# Patient Record
Sex: Female | Born: 1990 | Race: Black or African American | Hispanic: No | Marital: Single | Smoking: Former smoker
Health system: Southern US, Community
[De-identification: ages and names within clinical notes are randomized; demographics above are authoritative.]

## PROBLEM LIST (undated history)

## (undated) ENCOUNTER — Inpatient Hospital Stay (HOSPITAL_COMMUNITY): Payer: Self-pay

## (undated) ENCOUNTER — Ambulatory Visit: Admission: EM | Payer: Medicaid Other

## (undated) DIAGNOSIS — R87629 Unspecified abnormal cytological findings in specimens from vagina: Secondary | ICD-10-CM

## (undated) DIAGNOSIS — A549 Gonococcal infection, unspecified: Secondary | ICD-10-CM

## (undated) DIAGNOSIS — B009 Herpesviral infection, unspecified: Secondary | ICD-10-CM

## (undated) DIAGNOSIS — A749 Chlamydial infection, unspecified: Secondary | ICD-10-CM

## (undated) HISTORY — DX: Unspecified abnormal cytological findings in specimens from vagina: R87.629

---
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
## 2011-01-08 LAB — US OB DETAIL + 14 WK

## 2011-01-17 ENCOUNTER — Ambulatory Visit (HOSPITAL_COMMUNITY)
Admission: RE | Admit: 2011-01-17 | Discharge: 2011-01-17 | Disposition: A | Discharge status: Home / Self Care | Payer: Self-pay | Source: Ambulatory Visit | Attending: Obstetrics and Gynecology | Admitting: Obstetrics and Gynecology

## 2011-01-17 ENCOUNTER — Other Ambulatory Visit (HOSPITAL_COMMUNITY): Payer: Self-pay | Admitting: Obstetrics and Gynecology

## 2011-01-17 DIAGNOSIS — Z3689 Encounter for other specified antenatal screening: Secondary | ICD-10-CM

## 2011-01-17 DIAGNOSIS — Q308 Other congenital malformations of nose: Secondary | ICD-10-CM

## 2011-01-17 DIAGNOSIS — O358XX0 Maternal care for other (suspected) fetal abnormality and damage, not applicable or unspecified: Secondary | ICD-10-CM | POA: Insufficient documentation

## 2011-01-22 NOTE — L&D Delivery Note (Signed)
Delivery Note Pregnancy complicated by known fetal anomaly nasal bifid, mild pylectasis, and placental cyst. At 4:09 AM a viable female was delivered via Vaginal, Spontaneous Delivery, VTX, delivered in bed,prior to repositioning pt for exam with RN unable to trace fhts, NICU immediately called as cord clamped and baby to resusitation team at warmer, with heart rate 90, gasp noted no tone, APGAR: 2, 4; weight 5 lb 14.5 oz (2679 g).  Neonatologist arrived at warmer and reviewed known fetal anomaly nasal bifid. Placenta status: Intact, Spontaneous.  Cord: 3 vessels with the following complications: known placental cyst seen on fetal side approximately 8 cm size fluid filled to pathology. Cord pH obtained but no results obtained with specimen.  Anesthesia: Epidural  Episiotomy: None Lacerations: None Suture Repair: NA Est. Blood Loss (mL): 200  Mom to postpartum.  Baby to NICU for evaluation.  [name] 05/02/2011, 5:13 AM

## 2011-02-14 ENCOUNTER — Ambulatory Visit (HOSPITAL_COMMUNITY)
Admission: RE | Admit: 2011-02-14 | Discharge: 2011-02-14 | Disposition: A | Discharge status: Home / Self Care | Payer: Medicaid Other | Source: Ambulatory Visit | Attending: Obstetrics and Gynecology | Admitting: Obstetrics and Gynecology

## 2011-02-14 DIAGNOSIS — Z3689 Encounter for other specified antenatal screening: Secondary | ICD-10-CM

## 2011-02-14 DIAGNOSIS — O358XX0 Maternal care for other (suspected) fetal abnormality and damage, not applicable or unspecified: Secondary | ICD-10-CM | POA: Insufficient documentation

## 2011-02-14 DIAGNOSIS — Q308 Other congenital malformations of nose: Secondary | ICD-10-CM

## 2011-02-14 NOTE — Progress Notes (Signed)
Encounter addended by: Marlana Latus, RN on: 02/14/2011  7:13 PM<BR>     Documentation filed: Episodes, Chief Complaint Section

## 2011-02-14 NOTE — Progress Notes (Signed)
Obstetric ultrasound performed today.  Please see report in [facility]. 

## 2011-03-07 ENCOUNTER — Ambulatory Visit (HOSPITAL_COMMUNITY): Payer: Self-pay

## 2011-03-14 ENCOUNTER — Ambulatory Visit (HOSPITAL_COMMUNITY)
Admission: RE | Admit: 2011-03-14 | Discharge: 2011-03-14 | Disposition: A | Discharge status: Home / Self Care | Payer: Medicaid Other | Source: Ambulatory Visit | Attending: Obstetrics and Gynecology | Admitting: Obstetrics and Gynecology

## 2011-03-14 ENCOUNTER — Other Ambulatory Visit: Payer: Self-pay

## 2011-03-14 DIAGNOSIS — Z3689 Encounter for other specified antenatal screening: Secondary | ICD-10-CM | POA: Insufficient documentation

## 2011-03-14 DIAGNOSIS — O358XX0 Maternal care for other (suspected) fetal abnormality and damage, not applicable or unspecified: Secondary | ICD-10-CM | POA: Insufficient documentation

## 2011-03-14 DIAGNOSIS — Q308 Other congenital malformations of nose: Secondary | ICD-10-CM

## 2011-03-15 ENCOUNTER — Encounter (HOSPITAL_COMMUNITY): Payer: Self-pay

## 2011-03-15 ENCOUNTER — Inpatient Hospital Stay (HOSPITAL_COMMUNITY)
Admission: AD | Admit: 2011-03-15 | Discharge: 2011-03-15 | Disposition: A | Discharge status: Home / Self Care | Payer: Medicaid Other | Source: Ambulatory Visit | Attending: Obstetrics and Gynecology | Admitting: Obstetrics and Gynecology

## 2011-03-15 DIAGNOSIS — O99891 Other specified diseases and conditions complicating pregnancy: Secondary | ICD-10-CM | POA: Insufficient documentation

## 2011-03-15 DIAGNOSIS — N898 Other specified noninflammatory disorders of vagina: Secondary | ICD-10-CM | POA: Insufficient documentation

## 2011-03-15 HISTORY — DX: Chlamydial infection, unspecified: A74.9

## 2011-03-15 HISTORY — DX: Gonococcal infection, unspecified: A54.9

## 2011-03-15 NOTE — Progress Notes (Signed)
Pt reports a lot of clear "snotty" discharge since last night, no foul odor. Denies vaginal bleeding. Reports positive fetal movement. Denies contractions.

## 2011-03-15 NOTE — Discharge Instructions (Signed)

## 2011-03-15 NOTE — Progress Notes (Signed)
History    21 yo g1p0 presents with c/o of thick discharge no vag irritation or bleeding, no UC, +FM. No chief complaint on file.  @SFHPI @  OB History    Grav Para Term Preterm Abortions TAB SAB Ect Mult Living   2 0 0 0 1 0 1 0 0 0       Past Medical History  Diagnosis Date  . Asthma   . Gonorrhea 2011  . Chlamydia 2011    Past Surgical History  Procedure Date  . Knee surgery 2007    Family History  Problem Relation Age of Onset  . Anesthesia problems Neg Hx   . Diabetes Maternal Grandmother   . Hypertension Maternal Grandmother     History  Substance Use Topics  . Smoking status: Former Games developer  . Smokeless tobacco: Not on file  . Alcohol Use: No    Allergies: No Known Allergies  Prescriptions prior to admission  Medication Sig Dispense Refill  . Ondansetron HCl (ZOFRAN PO) Take by mouth.      . Prenatal Vit-Fe Fumarate-FA (PRENATAL MULTIVITAMIN) TABS Take 1 tablet by mouth daily.      Marland Kitchen DISCONTD: PRENATAL VITAMINS PO Take by mouth.         @ROS @ Physical Exam   Blood pressure 124/72, pulse 90, temperature 97 F (36.1 C), temperature source Oral, resp. rate 16, last menstrual period 08/08/2010.  abd soft, gravid nt fhts reactive NST uc without SSE thick white to clear discharge no bleeding, no odor, vag LTC high A normal discharge P discharge home, f/o office as scheduled, s/s ptl to report. Lavera Guise, CNM

## 2011-03-19 ENCOUNTER — Encounter: Payer: Self-pay | Admitting: Obstetrics and Gynecology

## 2011-03-19 ENCOUNTER — Other Ambulatory Visit: Payer: Self-pay | Admitting: Obstetrics and Gynecology

## 2011-03-19 ENCOUNTER — Inpatient Hospital Stay (HOSPITAL_COMMUNITY)
Admission: AD | Admit: 2011-03-19 | Discharge: 2011-03-19 | Disposition: A | Discharge status: Home / Self Care | Payer: Medicaid Other | Source: Ambulatory Visit | Attending: Obstetrics and Gynecology | Admitting: Obstetrics and Gynecology

## 2011-03-19 ENCOUNTER — Encounter (HOSPITAL_COMMUNITY): Payer: Self-pay | Admitting: *Deleted

## 2011-03-19 DIAGNOSIS — N898 Other specified noninflammatory disorders of vagina: Secondary | ICD-10-CM

## 2011-03-19 DIAGNOSIS — R109 Unspecified abdominal pain: Secondary | ICD-10-CM | POA: Insufficient documentation

## 2011-03-19 DIAGNOSIS — Z331 Pregnant state, incidental: Secondary | ICD-10-CM

## 2011-03-19 DIAGNOSIS — O99891 Other specified diseases and conditions complicating pregnancy: Secondary | ICD-10-CM | POA: Insufficient documentation

## 2011-03-19 DIAGNOSIS — O212 Late vomiting of pregnancy: Secondary | ICD-10-CM | POA: Insufficient documentation

## 2011-03-19 DIAGNOSIS — O368190 Decreased fetal movements, unspecified trimester, not applicable or unspecified: Secondary | ICD-10-CM | POA: Insufficient documentation

## 2011-03-19 DIAGNOSIS — N949 Unspecified condition associated with female genital organs and menstrual cycle: Secondary | ICD-10-CM

## 2011-03-19 LAB — URINALYSIS, ROUTINE W REFLEX MICROSCOPIC
Bilirubin Urine: NEGATIVE
Nitrite: NEGATIVE
Specific Gravity, Urine: <1.005 — ABNORMAL LOW (ref 1.005–1.030)
pH: 6.5 (ref 5.0–8.0)

## 2011-03-19 LAB — WET PREP, GENITAL
Clue Cells Wet Prep HPF POC: NONE SEEN
Trich, Wet Prep: NONE SEEN

## 2011-03-19 LAB — URINE MICROSCOPIC-ADD ON

## 2011-03-19 MED ORDER — PROMETHAZINE HCL 12.5 MG PO TABS: 25.0000 mg | ORAL_TABLET | Freq: Four times a day (QID) | ORAL | Status: DC | PRN

## 2011-03-19 NOTE — Discharge Instructions (Signed)
Preventing Preterm Labor Preterm labor is when a pregnant woman has contractions that cause the cervix to open, shorten, and thin before 37 weeks of pregnancy. You will have regular contractions (tightening) 2 to 3 minutes apart. This usually causes discomfort or pain. HOME CARE  Eat a healthy diet.   Take your vitamins as told by your doctor.   Drink enough fluids to keep your pee (urine) clear or pale yellow every day.   Get rest and sleep.   Do not have sex if you are at high risk for preterm labor.   Follow your doctor's advice about activity, medicines, and tests.   Avoid stress.   Avoid hard labor or exercise that lasts for a long time.   Do not smoke.  GET HELP RIGHT AWAY IF:   You are having contractions.   You have belly (abdominal) pain.   You have bleeding from your vagina.   You have pain when you pee (urinate).   You have abnormal discharge from your vagina.   You have a temperature by mouth above 102 F (38.9 C).  MAKE SURE YOU:  Understand these instructions.   Will watch your condition.   Will get help if you are not doing well or get worse.  Document Released: 04/05/2008 Document Revised: 09/19/2010 Document Reviewed: 04/05/2008 [employer] Patient Information 2012 [employer]. 

## 2011-03-19 NOTE — Progress Notes (Signed)
States has been vomiting everyday. States this morning when vomiting, felt some fluid coming out of vagina. States was watery and clear. Still leaking "a little bit." Has pain in lower abdomen since this AM. Comes and goes. States fetal movement decreased today.

## 2011-03-19 NOTE — ED Provider Notes (Signed)
History     Chief Complaint  Patient presents with  . Abdominal Pain   HPI Comments: Pt is a G2P0 at [redacted]w[redacted]d that arrived after calling office with multiple c/o. Today after vomiting, felt like liquid came out and then felt more "wet" today. Denies any itching or burning with d/c. C/o suprapubic pain that was constant today but has now resolved. C/o decreased FM, but baby now has been moving well since being put on EFM. States she been taking zofran with good results, but feels it's not helping as much, discussed diet recommendations. Denies any ctx, or VB. Has f/u in office tmrw with [place]. Pregnancy significant for: 1. Probably bifid fetal nose - has seen MFM and plans fetal echo 2. Placental cyst 3. Resolved fetal pyelectasis 4. Hx STD's     Past Medical History  Diagnosis Date  . Asthma   . Gonorrhea 2011  . Chlamydia 2011    Past Surgical History  Procedure Date  . Knee surgery 2007    Family History  Problem Relation Age of Onset  . Anesthesia problems Neg Hx   . Diabetes Maternal Grandmother   . Hypertension Maternal Grandmother     History  Substance Use Topics  . Smoking status: Former Games developer  . Smokeless tobacco: Never Used  . Alcohol Use: No    Allergies: No Known Allergies  Prescriptions prior to admission  Medication Sig Dispense Refill  . ondansetron (ZOFRAN) 8 MG tablet Take 8 mg by mouth every 8 (eight) hours as needed. For nausea      . Prenatal Vit-Fe Fumarate-FA (PRENATAL MULTIVITAMIN) TABS Take 1 tablet by mouth daily.        Review of Systems  Gastrointestinal: Positive for nausea, vomiting and constipation.  Genitourinary: Negative for dysuria and urgency.  All other systems reviewed and are negative.   Physical Exam   Blood pressure 121/70, pulse 85, temperature 98.2 F (36.8 C), temperature source Oral, resp. rate 18, height 5\' 7"  (1.702 m), weight 116.121 kg (256 lb), last menstrual period 08/08/2010.  Physical Exam  Nursing note and  vitals reviewed. Constitutional: She is oriented to person, place, and time. She appears well-developed and well-nourished. No distress.  HENT:  Head: Normocephalic.  Neck: Normal range of motion.  GI: Soft. She exhibits no distension. There is no tenderness.       Gravid, soft  Genitourinary: Vaginal discharge found.       Mod amt thick non-adherent white d/c, cx slightly friable Ve=cl/th/high  Musculoskeletal: Normal range of motion.  Neurological: She is alert and oriented to person, place, and time.  Skin: Skin is warm and dry.  Psychiatric: She has a normal mood and affect. Her behavior is normal.  FHR 130 reactive cat 1 toco UI  MAU Course  Procedures    Assessment and Plan  IUP at [redacted]w[redacted]d Wet prep neg UA neg amnisure neg GC/CT sent RX for phenergan to take at night   [name] M 03/19/2011, 7:47 PM

## 2011-03-20 ENCOUNTER — Encounter (INDEPENDENT_AMBULATORY_CARE_PROVIDER_SITE_OTHER): Payer: Medicaid Other | Admitting: Obstetrics and Gynecology

## 2011-03-20 ENCOUNTER — Other Ambulatory Visit: Payer: Self-pay

## 2011-03-20 DIAGNOSIS — Z331 Pregnant state, incidental: Secondary | ICD-10-CM

## 2011-03-20 LAB — GC/CHLAMYDIA PROBE AMP, GENITAL: Chlamydia, DNA Probe: NEGATIVE

## 2011-03-21 ENCOUNTER — Encounter: Payer: Self-pay | Admitting: Maternal and Fetal Medicine

## 2011-03-22 DIAGNOSIS — Q369 Cleft lip, unilateral: Secondary | ICD-10-CM | POA: Insufficient documentation

## 2011-03-22 DIAGNOSIS — Q759 Congenital malformation of skull and face bones, unspecified: Secondary | ICD-10-CM | POA: Insufficient documentation

## 2011-03-25 ENCOUNTER — Inpatient Hospital Stay (HOSPITAL_COMMUNITY)
Admission: AD | Admit: 2011-03-25 | Discharge: 2011-03-25 | Disposition: A | Discharge status: Home / Self Care | Payer: Medicaid Other | Attending: Obstetrics and Gynecology | Admitting: Obstetrics and Gynecology

## 2011-03-25 ENCOUNTER — Encounter (HOSPITAL_COMMUNITY): Payer: Self-pay | Admitting: *Deleted

## 2011-03-25 DIAGNOSIS — Z331 Pregnant state, incidental: Secondary | ICD-10-CM

## 2011-03-25 DIAGNOSIS — N898 Other specified noninflammatory disorders of vagina: Secondary | ICD-10-CM

## 2011-03-25 DIAGNOSIS — IMO0002 Reserved for concepts with insufficient information to code with codable children: Secondary | ICD-10-CM

## 2011-03-25 DIAGNOSIS — O43109 Malformation of placenta, unspecified, unspecified trimester: Secondary | ICD-10-CM

## 2011-03-25 DIAGNOSIS — O99891 Other specified diseases and conditions complicating pregnancy: Secondary | ICD-10-CM | POA: Insufficient documentation

## 2011-03-25 LAB — WET PREP, GENITAL
Trich, Wet Prep: NONE SEEN
Yeast Wet Prep HPF POC: NONE SEEN

## 2011-03-25 NOTE — Progress Notes (Signed)
V. Latham, CNM at bedside.  Assessment done and poc discussed with pt.  

## 2011-03-25 NOTE — Progress Notes (Signed)
Manfred Arch, CNM notified of pt presenting for ?ROM.  Will be down to see pt.

## 2011-03-25 NOTE — Progress Notes (Signed)
SSE per CNM. Wet prep and amnisure collected. GBS collected also.

## 2011-03-25 NOTE — Discharge Instructions (Signed)

## 2011-03-25 NOTE — ED Provider Notes (Signed)
History    21 yo G1P0 at 16 5/7 weeks presented c/o episode of wetness upon awakening this am, but no further leaking since.  Denies contractions or cramping, reports +FM.  Seen 03/19/11 for same complaint.  Pregnancy remarkable for: Fetal facial cleft involving the nose--followed at [facility] Mild fetal renal pylectasis Increased BMI Placental mass, solid, no vascular component Hx STDs in past Asthma  OB History    Grav Para Term Preterm Abortions TAB SAB Ect Mult Living   2 0 0 0 1 0 1 0 0 0       Past Medical History  Diagnosis Date  . Asthma   . Gonorrhea 2011  . Chlamydia 2011    Past Surgical History  Procedure Date  . Knee surgery 2007    Family History  Problem Relation Age of Onset  . Anesthesia problems Neg Hx   . Diabetes Maternal Grandmother   . Hypertension Maternal Grandmother     History  Substance Use Topics  . Smoking status: Former Games developer  . Smokeless tobacco: Never Used  . Alcohol Use: No    Allergies: No Known Allergies  Prescriptions prior to admission  Medication Sig Dispense Refill  . ondansetron (ZOFRAN) 8 MG tablet Take 8 mg by mouth every 8 (eight) hours as needed. For nausea      . Prenatal Vit-Fe Fumarate-FA (PRENATAL MULTIVITAMIN) TABS Take 1 tablet by mouth daily.      . promethazine (PHENERGAN) 12.5 MG tablet Take 2 tablets (25 mg total) by mouth every 6 (six) hours as needed for nausea.  30 tablet  1     Physical Exam   Blood pressure 115/71, pulse 86, temperature 97.8 F (36.6 C), temperature source Oral, resp. rate 20, height 5\' 7"  (1.702 m), weight 117.198 kg (258 lb 6 oz), last menstrual period 08/08/2010, SpO2 99.00%.  Chest clear Heart RRR without murmur Abd gravid, NT Pelvic:  Moderate amount white vaginal discharge in vault, no pooling Cervix closed, long, pp high Ext WNL  FHR reactive in segments Occasional mild irritability, 1 or 2 contractions in entire monitoring episode  Amnisure negative. GC, chlamydia  negative from 03/19/11 GBS done Wet prep negative   ED Course  IUP at 32 5/7 weeks No evidence SROM  Plan: D/C home with PTL precautions Keep scheduled appointments with [facility] and MFM.  Nigel Bridgeman, CNM, [place] 03/25/11 7:40am

## 2011-03-25 NOTE — Progress Notes (Signed)
Pt states she got up to the BR and when she was getting back to bed she had a gush of clear fluid-states the washcloth between her legs is a little wet but not gushing fluid

## 2011-03-27 ENCOUNTER — Encounter (HOSPITAL_COMMUNITY): Payer: Self-pay | Admitting: MS"

## 2011-03-27 ENCOUNTER — Telehealth (HOSPITAL_COMMUNITY): Payer: Self-pay | Admitting: MS"

## 2011-03-27 LAB — CULTURE, BETA STREP (GROUP B ONLY)

## 2011-03-27 NOTE — Telephone Encounter (Signed)
Spoke with patient's mom regarding fetal MRI appointment scheduled on Tuesday, 04/02/11 at 8:00 am for [facility]. Discussed that this may help further characterize what we are seeing in the baby. Discussed that patient is not to eat, drink, or take prenatal vitamins prior to appointment and arrive 30 minutes early.

## 2011-03-28 ENCOUNTER — Telehealth (HOSPITAL_COMMUNITY): Payer: Self-pay | Admitting: MS"

## 2011-03-28 NOTE — Telephone Encounter (Signed)
Called [name] to discuss her [facility], cell free fetal DNA testing.  We reviewed that these are within normal limits, showing a less than 1 in 10,000 risk for trisomies 21, 18 and 13.  We reviewed that this testing identifies > 99% of pregnancies with trisomy 21, >97% of pregnancies with trisomy 34, and >80% with trisomy 47; the false positive rate is <0.1% for all conditions.  She understands that this testing does not identify all genetic conditions.  All questions were answered to her satisfaction, she was encouraged to call with additional questions or concerns.  Quinn Plowman, MS Patent attorney

## 2011-04-04 ENCOUNTER — Encounter (INDEPENDENT_AMBULATORY_CARE_PROVIDER_SITE_OTHER): Payer: Medicaid Other | Admitting: Obstetrics and Gynecology

## 2011-04-04 DIAGNOSIS — Z331 Pregnant state, incidental: Secondary | ICD-10-CM

## 2011-04-10 NOTE — Progress Notes (Signed)
Obstetric ultrasound performed today.  Please see [facility] for full report.

## 2011-04-11 ENCOUNTER — Other Ambulatory Visit (HOSPITAL_COMMUNITY): Payer: Self-pay | Admitting: Maternal and Fetal Medicine

## 2011-04-11 ENCOUNTER — Ambulatory Visit (HOSPITAL_COMMUNITY)
Admission: RE | Admit: 2011-04-11 | Discharge: 2011-04-11 | Disposition: A | Discharge status: Home / Self Care | Payer: Medicaid Other | Source: Ambulatory Visit | Attending: Obstetrics and Gynecology | Admitting: Obstetrics and Gynecology

## 2011-04-11 DIAGNOSIS — O4700 False labor before 37 completed weeks of gestation, unspecified trimester: Secondary | ICD-10-CM

## 2011-04-11 DIAGNOSIS — Z3689 Encounter for other specified antenatal screening: Secondary | ICD-10-CM | POA: Insufficient documentation

## 2011-04-11 DIAGNOSIS — O358XX0 Maternal care for other (suspected) fetal abnormality and damage, not applicable or unspecified: Secondary | ICD-10-CM | POA: Insufficient documentation

## 2011-04-11 DIAGNOSIS — Q308 Other congenital malformations of nose: Secondary | ICD-10-CM

## 2011-04-12 ENCOUNTER — Encounter (HOSPITAL_COMMUNITY): Payer: Self-pay | Admitting: *Deleted

## 2011-04-12 ENCOUNTER — Inpatient Hospital Stay (HOSPITAL_COMMUNITY)
Admission: AD | Admit: 2011-04-12 | Discharge: 2011-04-12 | Disposition: A | Discharge status: Home / Self Care | Payer: Medicaid Other | Source: Ambulatory Visit | Attending: Obstetrics and Gynecology | Admitting: Obstetrics and Gynecology

## 2011-04-12 DIAGNOSIS — N949 Unspecified condition associated with female genital organs and menstrual cycle: Secondary | ICD-10-CM | POA: Insufficient documentation

## 2011-04-12 DIAGNOSIS — R109 Unspecified abdominal pain: Secondary | ICD-10-CM | POA: Insufficient documentation

## 2011-04-12 DIAGNOSIS — O99891 Other specified diseases and conditions complicating pregnancy: Secondary | ICD-10-CM | POA: Insufficient documentation

## 2011-04-12 LAB — URINALYSIS, ROUTINE W REFLEX MICROSCOPIC
Bilirubin Urine: NEGATIVE
Glucose, UA: NEGATIVE mg/dL
Hgb urine dipstick: NEGATIVE
Ketones, ur: NEGATIVE mg/dL
Protein, ur: NEGATIVE mg/dL

## 2011-04-12 LAB — WET PREP, GENITAL: Clue Cells Wet Prep HPF POC: NONE SEEN

## 2011-04-12 MED ORDER — HYDROXYZINE PAMOATE 50 MG PO CAPS: 50.0000 mg | ORAL_CAPSULE | ORAL | Status: DC | PRN

## 2011-04-12 NOTE — MAU Note (Signed)
Patient complains of pain and pressure in her abdomen and vaginal area since 6pm. Leaking clear fluid earlier in the evening, not leaking currently

## 2011-04-12 NOTE — MAU Provider Note (Signed)
  History     CSN: [id number]  Arrival date and time: 04/12/11 2956   First Provider Initiated Contact with Patient 04/12/11 [phone]      Chief Complaint  Patient presents with  . Abdominal Pain   HPI Comments: Pt is a G2P0 at [redacted]w[redacted]d with c/o pelvic pressure/pain. Unsure about ctx, denies any VB, c/o leaking fluid/?D/C earlier, but denies now. GFM. Pt had fetal MRI this week, secondary to fetal facial anomaly.   Abdominal Pain      Past Medical History  Diagnosis Date  . Asthma   . Gonorrhea 2011  . Chlamydia 2011    Past Surgical History  Procedure Date  . Knee surgery 2007    Family History  Problem Relation Age of Onset  . Anesthesia problems Neg Hx   . Diabetes Maternal Grandmother   . Hypertension Maternal Grandmother     History  Substance Use Topics  . Smoking status: Former Games developer  . Smokeless tobacco: Never Used  . Alcohol Use: No    Allergies: No Known Allergies  Prescriptions prior to admission  Medication Sig Dispense Refill  . ondansetron (ZOFRAN) 8 MG tablet Take 8 mg by mouth every 8 (eight) hours as needed. For nausea      . Prenatal Vit-Fe Fumarate-FA (PRENATAL MULTIVITAMIN) TABS Take 1 tablet by mouth daily.        Review of Systems  Gastrointestinal: Positive for abdominal pain.  All other systems reviewed and are negative.   Physical Exam   Blood pressure 134/79, pulse 93, temperature 98.4 F (36.9 C), temperature source Oral, resp. rate 20, last menstrual period 08/08/2010.  Physical Exam  Nursing note and vitals reviewed. Constitutional: She is oriented to person, place, and time. She appears well-developed and well-nourished.  HENT:  Head: Normocephalic.  Neck: Normal range of motion.  Cardiovascular: Normal rate.   Respiratory: Effort normal.  GI: Soft. She exhibits no distension.  Genitourinary: Vaginal discharge found.       Mod amt white d/c cx slightly friable VE=/Ft/th/high  Musculoskeletal: Normal range of motion.  She exhibits no edema.  Neurological: She is alert and oriented to person, place, and time. She has normal reflexes.  Skin: Skin is warm and dry.  Psychiatric: She has a normal mood and affect. Her behavior is normal.    MAU Course  Procedures    Assessment and Plan  IUP at [redacted]w[redacted]d Wet prep, amnisure neg  D/C home  Rx: vistaril 50mg  PO q4h prn F/u as scheduled at office [facility] and preterm labor precautions Adessa Primiano M 04/12/2011, 5:35 AM

## 2011-04-12 NOTE — Discharge Instructions (Signed)

## 2011-04-13 LAB — GC/CHLAMYDIA PROBE AMP, GENITAL
Chlamydia, DNA Probe: NEGATIVE
GC Probe Amp, Genital: NEGATIVE

## 2011-04-16 ENCOUNTER — Inpatient Hospital Stay (HOSPITAL_COMMUNITY)
Admission: AD | Admit: 2011-04-16 | Discharge: 2011-04-16 | Disposition: A | Discharge status: Home / Self Care | Payer: Medicaid Other | Source: Ambulatory Visit | Attending: Obstetrics and Gynecology | Admitting: Obstetrics and Gynecology

## 2011-04-16 ENCOUNTER — Encounter (HOSPITAL_COMMUNITY): Payer: Self-pay

## 2011-04-16 DIAGNOSIS — R109 Unspecified abdominal pain: Secondary | ICD-10-CM | POA: Insufficient documentation

## 2011-04-16 DIAGNOSIS — O4703 False labor before 37 completed weeks of gestation, third trimester: Secondary | ICD-10-CM

## 2011-04-16 DIAGNOSIS — O99891 Other specified diseases and conditions complicating pregnancy: Secondary | ICD-10-CM | POA: Insufficient documentation

## 2011-04-16 DIAGNOSIS — M259 Joint disorder, unspecified: Secondary | ICD-10-CM

## 2011-04-16 DIAGNOSIS — O218 Other vomiting complicating pregnancy: Secondary | ICD-10-CM

## 2011-04-16 DIAGNOSIS — M899 Disorder of bone, unspecified: Secondary | ICD-10-CM

## 2011-04-16 DIAGNOSIS — M549 Dorsalgia, unspecified: Secondary | ICD-10-CM | POA: Insufficient documentation

## 2011-04-16 DIAGNOSIS — O9989 Other specified diseases and conditions complicating pregnancy, childbirth and the puerperium: Secondary | ICD-10-CM

## 2011-04-16 LAB — URINALYSIS, ROUTINE W REFLEX MICROSCOPIC
Bilirubin Urine: NEGATIVE
Glucose, UA: NEGATIVE mg/dL
Hgb urine dipstick: NEGATIVE
Ketones, ur: NEGATIVE mg/dL
Leukocytes, UA: NEGATIVE
Nitrite: NEGATIVE
Protein, ur: NEGATIVE mg/dL
Specific Gravity, Urine: 1.01 (ref 1.005–1.030)
Urobilinogen, UA: 0.2 mg/dL (ref 0.0–1.0)
pH: 7.5 (ref 5.0–8.0)

## 2011-04-16 NOTE — Discharge Instructions (Signed)
Preterm Labor Preterm labor is when labor starts at less than 37 weeks of pregnancy. The normal length of a pregnancy is 39 to 41 weeks. CAUSES Often, there is no identifiable underlying cause as to why a woman goes into preterm labor. However, one of the most common known causes of preterm labor is infection. Infections of the uterus, cervix, vagina, amniotic sac, bladder, kidney, or even the lungs (pneumonia) can cause labor to start. Other causes of preterm labor include:  Urogenital infections, such as yeast infections and bacterial vaginosis.   Uterine abnormalities (uterine shape, uterine septum, fibroids, bleeding from the placenta).   A cervix that has been operated on and opens prematurely.   Malformations in the baby.   Multiple gestations (twins, triplets, and so on).   Breakage of the amniotic sac.  Additional risk factors for preterm labor include:  Previous history of preterm labor.   Premature rupture of membranes (PROM).   A placenta that covers the opening of the cervix (placenta previa).   A placenta that separates from the uterus (placenta abruption).   A cervix that is too weak to hold the baby in the uterus (incompetence cervix).   Having too much fluid in the amniotic sac (polyhydramnios).   Taking illegal drugs or smoking while pregnant.   Not gaining enough weight while pregnant.   Women younger than 5 and older than 20 years old.   Low socioeconomic status.   African-American ethnicity.  SYMPTOMS Signs and symptoms of preterm labor include:  Menstrual-like cramps.   Contractions that are 30 to 70 seconds apart, become very regular, closer together, and are more intense and painful.   Contractions that start on the top of the uterus and spread down to the lower abdomen and back.   A sense of increased pelvic pressure or back pain.   A watery or bloody discharge that comes from the vagina.  DIAGNOSIS  A diagnosis can be confirmed by:  A  vaginal exam.   An ultrasound of the cervix.   Sampling (swabbing) cervico-vaginal secretions. These samples can be tested for the presence of fetal fibronectin. This is a protein found in cervical discharge which is associated with preterm labor.   Fetal monitoring.  TREATMENT  Depending on the length of the pregnancy and other circumstances, a caregiver may suggest bed rest. If necessary, there are medicines that can be given to stop contractions and to quicken fetal lung maturity. If labor happens before 34 weeks of pregnancy, a prolonged hospital stay may be recommended. Treatment depends on the condition of both the mother and baby. PREVENTION There are some things a mother can do to lower the risk of preterm labor in future pregnancies. A woman can:   Stop smoking.   Maintain healthy weight gain and avoid chemicals and drugs that are not necessary.   Be watchful for any type of infection.   Inform her caregiver if she has a known history of preterm labor.  Document Released: 03/30/2003 Document Revised: 12/27/2010 Document Reviewed: 05/04/2010 [facility] Patient Information 2012 [place], [place].Back Pain in Pregnancy Back pain during pregnancy is common. It happens in about half of all pregnancies. It is important for you and your baby that you remain active during your pregnancy.If you feel that back pain is not allowing you to remain active or sleep well, it is time to see your caregiver. Back pain may be caused by several factors related to changes during your pregnancy.Fortunately, unless you had trouble with your back  before your pregnancy, the pain is likely to get better after you deliver. Low back pain usually occurs between the fifth and seventh months of pregnancy. It can, however, happen in the first couple months. Factors that increase the risk of back problems include:   Previous back problems.   Injury to your back.   Having twins or multiple births.   A chronic  cough.   Stress.   Job-related repetitive motions.   Muscle or spinal disease in the back.   Family history of back problems, ruptured (herniated) discs, or osteoporosis.   Depression, anxiety, and panic attacks.  CAUSES   When you are pregnant, your body produces a hormone called relaxin. This hormonemakes the ligaments connecting the low back and pubic bones more flexible. This flexibility allows the baby to be delivered more easily. When your ligaments are loose, your muscles need to work harder to support your back. Soreness in your back can come from tired muscles. Soreness can also come from back tissues that are irritated since they are receiving less support.   As the baby grows, it puts pressure on the nerves and blood vessels in your pelvis. This can cause back pain.   As the baby grows and gets heavier during pregnancy, the uterus pushes the stomach muscles forward and changes your center of gravity. This makes your back muscles work harder to maintain good posture.  SYMPTOMS  Lumbar pain during pregnancy Lumbar pain during pregnancy usually occurs at or above the waist in the center of the back. There may be pain and numbness that radiates into your leg or foot. This is similar to low back pain experienced by non-pregnant women. It usually increases with sitting for long periods of time, standing, or repetitive lifting. Tenderness may also be present in the muscles along your upper back. Posterior pelvic pain during pregnancy Pain in the back of the pelvis is more common than lumbar pain in pregnancy. It is a deep pain felt in your side at the waistline, or across the tailbone (sacrum), or in both places. You may have pain on one or both sides. This pain can also go into the buttocks and backs of the upper thighs. Pubic and groin pain may also be present. The pain does not quickly resolve with rest, and morning stiffness may also be present. Pelvic pain during pregnancy can be  brought on by most activities. A high level of fitness before and during pregnancy may or may not prevent this problem. Labor pain is usually 1 to 2 minutes apart, lasts for about 1 minute, and involves a bearing down feeling or pressure in your pelvis. However, if you are at term with the pregnancy, constant low back pain can be the beginning of early labor, and you should be aware of this. DIAGNOSIS  X-rays of the back should not be done during the first 12 to 14 weeks of the pregnancy and only when absolutely necessary during the rest of the pregnancy. MRIs do not give off radiation and are safe during pregnancy. MRIs also should only be done when absolutely necessary. HOME CARE INSTRUCTIONS  Exercise as directed by your caregiver. Exercise is the most effective way to prevent or manage back pain. If you have a back problem, it is especially important to avoid sports that require sudden body movements. Swimming and walking are great activities.   Do not stand in one place for long periods of time.   Do not wear high heels.  Sit in chairs with good posture. Use a pillow on your lower back if necessary. Make sure your head rests over your shoulders and is not hanging forward.   Try sleeping on your side, preferably the left side, with a pillow or two between your legs. If you are sore after a night's rest, your bedmay betoo soft.Try placing a board between your mattress and box spring.   Listen to your body when lifting.If you are experiencing pain, ask for help or try bending yourknees more so you can use your leg muscles rather than your back muscles. Squat down when picking up something from the floor. Do not bend over.   Eat a healthy diet. Try to gain weight within your caregiver's recommendations.   Use heat or cold packs 3 to 4 times a day for 15 minutes to help with the pain.   Only take over-the-counter or prescription medicines for pain, discomfort, or fever as directed by your  caregiver.  Sudden (acute) back pain  Use bed rest for only the most extreme, acute episodes of back pain. Prolonged bed rest over 48 hours will aggravate your condition.   Ice is very effective for acute conditions.   Put ice in a plastic bag.   Place a towel between your skin and the bag.   Leave the ice on for 10 to 20 minutes every 2 hours, or as needed.   Using heat packs for 30 minutes prior to activities is also helpful.  Continued back pain See your caregiver if you have continued problems. Your caregiver can help or refer you for appropriate physical therapy. With conditioning, most back problems can be avoided. Sometimes, a more serious issue may be the cause of back pain. You should be seen right away if new problems seem to be developing. Your caregiver may recommend:  A maternity girdle.   An elastic sling.   A back brace.   A massage therapist or acupuncture.  SEEK MEDICAL CARE IF:   You are not able to do most of your daily activities, even when taking the pain medicine you were given.   You need a referral to a physical therapist or chiropractor.   You want to try acupuncture.  SEEK IMMEDIATE MEDICAL CARE IF:  You develop numbness, tingling, weakness, or problems with the use of your arms or legs.   You develop severe back pain that is no longer relieved with medicines.   You have a sudden change in bowel or bladder control.   You have increasing pain in other areas of the body.   You develop shortness of breath, dizziness, or fainting.   You develop nausea, vomiting, or sweating.   You have back pain which is similar to labor pains.   You have back pain along with your water breaking or vaginal bleeding.   You have back pain or numbness that travels down your leg.   Your back pain developed after you fell.   You develop pain on one side of your back. You may have a kidney stone.   You see blood in your urine. You may have a bladder infection or  kidney stone.   You have back pain with blisters. You may have shingles.  Back pain is fairly common during pregnancy but should not be accepted as just part of the process. Back pain should always be treated as soon as possible. This will make your pregnancy as pleasant as possible. Document Released: 04/17/2005 Document Revised: 12/27/2010 Document  Reviewed: 05/29/2010 [facility] Patient Information 2012 [place], [place].

## 2011-04-16 NOTE — Progress Notes (Signed)
History   21 yo g2p0 35 6/7 week IUP presents with c/o of backache low earlier not anymore, vomit x 1 this am has been able to eat since with no nausea, pain low in front and points to suprapubic area, denies UC, SROM, or vag bleeding, has not filled RX for vistaril from MAU visit this week and not using ambein sleeping fine. With +FM.  No chief complaint on file.  @SFHPI @  OB History    Grav Para Term Preterm Abortions TAB SAB Ect Mult Living   2 0 0 0 1 0 1 0 0 0       Past Medical History  Diagnosis Date  . Asthma   . Gonorrhea 2011  . Chlamydia 2011    Past Surgical History  Procedure Date  . Knee surgery 2007    Family History  Problem Relation Age of Onset  . Anesthesia problems Neg Hx   . Diabetes Maternal Grandmother   . Hypertension Maternal Grandmother     History  Substance Use Topics  . Smoking status: Former Games developer  . Smokeless tobacco: Never Used  . Alcohol Use: No    Allergies: No Known Allergies  Prescriptions prior to admission  Medication Sig Dispense Refill  . ondansetron (ZOFRAN) 8 MG tablet Take 8 mg by mouth every 8 (eight) hours as needed. For nausea      . Prenatal Vit-Fe Fumarate-FA (PRENATAL MULTIVITAMIN) TABS Take 1 tablet by mouth every morning.         @ROS @ Physical Exam  Calm, no distress, abd soft, gravid, nt, UA negative, trace edema lower legs FHTS category 1 uc with out Vag LTC Blood pressure 120/77, pulse 107, temperature 99.1 F (37.3 C), temperature source Oral, resp. rate 18, last menstrual period 08/08/2010.  35 6/7 week IUP Known fetal nasal anamolie Not in labor Common discomforts of pg  And comfort measures reviewed, discussed s/s labor uc, srom, vag bleeding, kick counts to report, f/io office 3/27 as scheduled. Home after reactive NST. Lavera Guise, CNM

## 2011-04-16 NOTE — MAU Note (Addendum)
Pt states abd cramping and lower abdominal pressure today. Denies vaginal bleeding or LOF. Reports 1 episode of emesis this morning, denies nausea.

## 2011-04-17 ENCOUNTER — Encounter (INDEPENDENT_AMBULATORY_CARE_PROVIDER_SITE_OTHER): Payer: Medicaid Other | Admitting: Obstetrics and Gynecology

## 2011-04-17 DIAGNOSIS — Z3480 Encounter for supervision of other normal pregnancy, unspecified trimester: Secondary | ICD-10-CM

## 2011-04-18 ENCOUNTER — Inpatient Hospital Stay (HOSPITAL_COMMUNITY)
Admission: AD | Admit: 2011-04-18 | Discharge: 2011-04-19 | Disposition: A | Discharge status: Home / Self Care | Payer: Medicaid Other | Attending: Obstetrics and Gynecology | Admitting: Obstetrics and Gynecology

## 2011-04-18 DIAGNOSIS — O4703 False labor before 37 completed weeks of gestation, third trimester: Secondary | ICD-10-CM

## 2011-04-18 DIAGNOSIS — O4700 False labor before 37 completed weeks of gestation, unspecified trimester: Secondary | ICD-10-CM | POA: Insufficient documentation

## 2011-04-19 ENCOUNTER — Encounter (HOSPITAL_COMMUNITY): Payer: Self-pay | Admitting: *Deleted

## 2011-04-19 DIAGNOSIS — O479 False labor, unspecified: Secondary | ICD-10-CM

## 2011-04-19 DIAGNOSIS — O43899 Other placental disorders, unspecified trimester: Secondary | ICD-10-CM

## 2011-04-19 NOTE — MAU Provider Note (Signed)
  History   [name] is a 20y.o. SBF at 36.2 weeks who presents unannounced for labor check w/ CC of ctxs every 6-7 minutes since around 2230.  Denies VB, LOF, abnl d/c, PIH or UTI s/s.  Denies recent illness, fever, or malaise.  Last appt Wed and cx "1.5 cm" and cultures obtained.  Reports GFM, and reports at least "10" glasses of water yesterday. Pregnancy r/f: 1.  Fetal nose anomaly (suspected bifid nose--MFM and NICU agree is ok to deliver at [facility] and newborn to have sx 3 months PP)  [Harmony test neg; Nml fetal echo; pt had fetal MRI 04/03/11; has had Peds Plastic surgeon consult also r/e future infant sx] 2.  Placental mass (solid and cystic initially, but predominately cystic late 3rd trimester) 3.  H/o stds 4.  H/o asthma 5.  Obese 6.  20y.o.a. 7.  H/o mild fetal renal pyelectasis, but has resolved on f/u interval growth scans at [facility]  CSN: [id number]  Arrival date and time: 04/18/11 2352   None     Chief Complaint  Patient presents with  . Contractions   HPI  OB History    Grav Para Term Preterm Abortions TAB SAB Ect Mult Living   2 0 0 0 1 0 1 0 0 0       Past Medical History  Diagnosis Date  . Asthma   . Gonorrhea 2011  . Chlamydia 2011    Past Surgical History  Procedure Date  . Knee surgery 2007    Family History  Problem Relation Age of Onset  . Anesthesia problems Neg Hx   . Diabetes Maternal Grandmother   . Hypertension Maternal Grandmother     History  Substance Use Topics  . Smoking status: Former Games developer  . Smokeless tobacco: Never Used  . Alcohol Use: No    Allergies: No Known Allergies  Prescriptions prior to admission  Medication Sig Dispense Refill  . ondansetron (ZOFRAN) 8 MG tablet Take 8 mg by mouth every 8 (eight) hours as needed. For nausea      . Prenatal Vit-Fe Fumarate-FA (PRENATAL MULTIVITAMIN) TABS Take 1 tablet by mouth every morning.         ROS-see history above Physical Exam   Blood pressure 121/75, pulse  107, temperature 98.7 F (37.1 C), temperature source Oral, resp. rate 18, height 5\' 7"  (1.702 m), weight 120.657 kg (266 lb), last menstrual period 08/08/2010.  EFM:  130, reactive, moderate variability, no decels TOCO:  Rare UC; few ripples  Physical Exam  Constitutional: She is oriented to person, place, and time. She appears well-developed and well-nourished. No distress.  Cardiovascular: Normal rate.   Respiratory: Effort normal.  GI: Soft.  Genitourinary:       Cx:  1.5/60-70/-1 mid position; very soft  Neurological: She is alert and oriented to person, place, and time.  Skin: Skin is warm and dry.    MAU Course  Procedures 1.  NST  Assessment and Plan  1.  IUP at 36.2 weeks 2.  No cervical change; false labor 3.  Fetal nose anomaly (suspect bifid nose) 4.  Placental mass (predominantly cystic in late 3rd trimester per MFM) 5.  Cat I FHT  1.  D/c'd home w/ labor precautions 2.  Offered ambien or benadryl for therapeutic rest and pt declined 3.  F/u as scheduled at office or prn any concerns or questions  Sendy Pluta H 04/19/2011, 12:57 AM

## 2011-04-19 NOTE — MAU Note (Signed)
Pt states she started having contractions about 2hours ago. Pt's mother states contractions are about 5-6 minutes apart

## 2011-04-19 NOTE — Discharge Instructions (Signed)

## 2011-04-23 ENCOUNTER — Encounter (INDEPENDENT_AMBULATORY_CARE_PROVIDER_SITE_OTHER): Payer: Medicaid Other | Admitting: Obstetrics and Gynecology

## 2011-04-23 DIAGNOSIS — Z331 Pregnant state, incidental: Secondary | ICD-10-CM

## 2011-04-26 ENCOUNTER — Encounter: Payer: Medicaid Other | Admitting: Obstetrics and Gynecology

## 2011-04-30 ENCOUNTER — Ambulatory Visit (INDEPENDENT_AMBULATORY_CARE_PROVIDER_SITE_OTHER): Payer: Medicaid Other | Admitting: Obstetrics and Gynecology

## 2011-04-30 ENCOUNTER — Encounter: Payer: Self-pay | Admitting: Obstetrics and Gynecology

## 2011-04-30 DIAGNOSIS — O43109 Malformation of placenta, unspecified, unspecified trimester: Secondary | ICD-10-CM

## 2011-04-30 DIAGNOSIS — Z3400 Encounter for supervision of normal first pregnancy, unspecified trimester: Secondary | ICD-10-CM

## 2011-04-30 DIAGNOSIS — Q897 Multiple congenital malformations, not elsewhere classified: Secondary | ICD-10-CM

## 2011-04-30 DIAGNOSIS — O4390 Unspecified placental disorder, unspecified trimester: Secondary | ICD-10-CM

## 2011-04-30 DIAGNOSIS — O43199 Other malformation of placenta, unspecified trimester: Secondary | ICD-10-CM

## 2011-04-30 NOTE — Progress Notes (Signed)
CC. No issues today. Pt want cervix check today.

## 2011-05-01 ENCOUNTER — Encounter (HOSPITAL_COMMUNITY): Payer: Self-pay

## 2011-05-01 ENCOUNTER — Encounter (HOSPITAL_COMMUNITY): Payer: Self-pay | Admitting: Anesthesiology

## 2011-05-01 ENCOUNTER — Inpatient Hospital Stay (HOSPITAL_COMMUNITY): Payer: Medicaid Other | Admitting: Anesthesiology

## 2011-05-01 ENCOUNTER — Inpatient Hospital Stay (HOSPITAL_COMMUNITY)
Admission: AD | Admit: 2011-05-01 | Discharge: 2011-05-04 | DRG: 775 | Disposition: A | Discharge status: Home / Self Care | Payer: Medicaid Other | Source: Ambulatory Visit | Attending: Obstetrics and Gynecology | Admitting: Obstetrics and Gynecology

## 2011-05-01 DIAGNOSIS — O43899 Other placental disorders, unspecified trimester: Secondary | ICD-10-CM | POA: Diagnosis present

## 2011-05-01 DIAGNOSIS — Z3400 Encounter for supervision of normal first pregnancy, unspecified trimester: Secondary | ICD-10-CM | POA: Insufficient documentation

## 2011-05-01 DIAGNOSIS — IMO0002 Reserved for concepts with insufficient information to code with codable children: Secondary | ICD-10-CM | POA: Diagnosis present

## 2011-05-01 DIAGNOSIS — O43199 Other malformation of placenta, unspecified trimester: Secondary | ICD-10-CM | POA: Insufficient documentation

## 2011-05-01 DIAGNOSIS — O358XX0 Maternal care for other (suspected) fetal abnormality and damage, not applicable or unspecified: Principal | ICD-10-CM | POA: Diagnosis present

## 2011-05-01 DIAGNOSIS — O4700 False labor before 37 completed weeks of gestation, unspecified trimester: Secondary | ICD-10-CM | POA: Diagnosis present

## 2011-05-01 DIAGNOSIS — O43109 Malformation of placenta, unspecified, unspecified trimester: Secondary | ICD-10-CM | POA: Insufficient documentation

## 2011-05-01 LAB — ANTIBODY SCREEN: Antibody Screen: NEGATIVE

## 2011-05-01 LAB — GC/CHLAMYDIA PROBE AMP, GENITAL
Chlamydia: NEGATIVE
Gonorrhea: NEGATIVE

## 2011-05-01 LAB — CBC
Hemoglobin: 12.2 g/dL (ref 12.0–15.0)
MCHC: 34.4 g/dL (ref 30.0–36.0)
RBC: 4.47 MIL/uL (ref 3.87–5.11)
WBC: 13 10*3/uL — ABNORMAL HIGH (ref 4.0–10.5)

## 2011-05-01 LAB — HEPATITIS B SURFACE ANTIGEN: Hepatitis B Surface Ag: NEGATIVE

## 2011-05-01 LAB — HIV ANTIBODY (ROUTINE TESTING W REFLEX): HIV: NONREACTIVE

## 2011-05-01 MED ORDER — LIDOCAINE HCL (PF) 1 % IJ SOLN
30.0000 mL | INTRAMUSCULAR | Status: DC | PRN
Start: 2011-05-01 — End: 2011-05-02

## 2011-05-01 MED ORDER — LACTATED RINGERS IV SOLN
500.0000 mL | Freq: Once | INTRAVENOUS | Status: AC
  Administered 2011-05-01: 125 mL via INTRAVENOUS

## 2011-05-01 MED ORDER — CITRIC ACID-SODIUM CITRATE 334-500 MG/5ML PO SOLN: 30.0000 mL | ORAL | Status: DC | PRN

## 2011-05-01 MED ORDER — OXYTOCIN BOLUS FROM INFUSION
500.0000 mL | Freq: Once | INTRAVENOUS | Status: DC
  Filled 2011-05-01: qty 500

## 2011-05-01 MED ORDER — OXYCODONE-ACETAMINOPHEN 5-325 MG PO TABS: 1.0000 | ORAL_TABLET | ORAL | Status: DC | PRN

## 2011-05-01 MED ORDER — OXYTOCIN 20 UNITS IN LACTATED RINGERS INFUSION - SIMPLE
1.0000 m[IU]/min | INTRAVENOUS | Status: DC
  Administered 2011-05-01: 1 m[IU]/min via INTRAVENOUS
  Filled 2011-05-01: qty 1000

## 2011-05-01 MED ORDER — ACETAMINOPHEN 325 MG PO TABS: 650.0000 mg | ORAL_TABLET | ORAL | Status: DC | PRN

## 2011-05-01 MED ORDER — ONDANSETRON HCL 4 MG/2ML IJ SOLN
4.0000 mg | Freq: Four times a day (QID) | INTRAMUSCULAR | Status: DC | PRN
  Administered 2011-05-02: 4 mg via INTRAVENOUS
  Filled 2011-05-01: qty 2

## 2011-05-01 MED ORDER — DIPHENHYDRAMINE HCL 50 MG/ML IJ SOLN: 12.5000 mg | INTRAMUSCULAR | Status: DC | PRN

## 2011-05-01 MED ORDER — OXYTOCIN 20 UNITS IN LACTATED RINGERS INFUSION - SIMPLE: 1.0000 m[IU]/min | INTRAVENOUS | Status: DC

## 2011-05-01 MED ORDER — BUTORPHANOL TARTRATE 2 MG/ML IJ SOLN: 1.0000 mg | INTRAMUSCULAR | Status: DC | PRN

## 2011-05-01 MED ORDER — LIDOCAINE HCL (PF) 1 % IJ SOLN
INTRAMUSCULAR | Status: DC | PRN
  Administered 2011-05-01: 5 mL
  Administered 2011-05-01: 4 mL

## 2011-05-01 MED ORDER — FLEET ENEMA 7-19 GM/118ML RE ENEM: 1.0000 | ENEMA | RECTAL | Status: DC | PRN

## 2011-05-01 MED ORDER — LACTATED RINGERS IV SOLN
500.0000 mL | INTRAVENOUS | Status: DC | PRN
  Administered 2011-05-01: 1000 mL via INTRAVENOUS
  Administered 2011-05-01: 500 mL via INTRAVENOUS
  Administered 2011-05-02: 125 mL via INTRAVENOUS

## 2011-05-01 MED ORDER — EPHEDRINE 5 MG/ML INJ
10.0000 mg | INTRAVENOUS | Status: DC | PRN
  Filled 2011-05-01: qty 4

## 2011-05-01 MED ORDER — EPHEDRINE 5 MG/ML INJ
10.0000 mg | INTRAVENOUS | Status: DC | PRN
Start: 2011-05-01 — End: 2011-05-02

## 2011-05-01 MED ORDER — OXYTOCIN 20 UNITS IN LACTATED RINGERS INFUSION - SIMPLE
125.0000 mL/h | Freq: Once | INTRAVENOUS | Status: AC
  Administered 2011-05-02: 999 mL/h via INTRAVENOUS

## 2011-05-01 MED ORDER — FENTANYL 2.5 MCG/ML BUPIVACAINE 1/10 % EPIDURAL INFUSION (WH - ANES)
INTRAMUSCULAR | Status: DC | PRN
  Administered 2011-05-01: 14 mL/h via EPIDURAL

## 2011-05-01 MED ORDER — TERBUTALINE SULFATE 1 MG/ML IJ SOLN: 0.2500 mg | Freq: Once | INTRAMUSCULAR | Status: AC | PRN

## 2011-05-01 MED ORDER — PHENYLEPHRINE 40 MCG/ML (10ML) SYRINGE FOR IV PUSH (FOR BLOOD PRESSURE SUPPORT)
80.0000 ug | PREFILLED_SYRINGE | INTRAVENOUS | Status: DC | PRN
  Filled 2011-05-01: qty 5

## 2011-05-01 MED ORDER — FENTANYL 2.5 MCG/ML BUPIVACAINE 1/10 % EPIDURAL INFUSION (WH - ANES)
14.0000 mL/h | INTRAMUSCULAR | Status: DC
  Administered 2011-05-02 (×2): 14 mL/h via EPIDURAL
  Filled 2011-05-01 (×3): qty 60

## 2011-05-01 MED ORDER — PROMETHAZINE HCL 25 MG/ML IJ SOLN: 12.5000 mg | INTRAMUSCULAR | Status: DC | PRN

## 2011-05-01 MED ORDER — IBUPROFEN 600 MG PO TABS: 600.0000 mg | ORAL_TABLET | Freq: Four times a day (QID) | ORAL | Status: DC | PRN

## 2011-05-01 MED ORDER — ZOLPIDEM TARTRATE 10 MG PO TABS: 10.0000 mg | ORAL_TABLET | Freq: Every evening | ORAL | Status: DC | PRN

## 2011-05-01 MED ORDER — BUTORPHANOL TARTRATE 2 MG/ML IJ SOLN: 2.0000 mg | INTRAMUSCULAR | Status: DC | PRN

## 2011-05-01 MED ORDER — PHENYLEPHRINE 40 MCG/ML (10ML) SYRINGE FOR IV PUSH (FOR BLOOD PRESSURE SUPPORT): 80.0000 ug | PREFILLED_SYRINGE | INTRAVENOUS | Status: DC | PRN

## 2011-05-01 NOTE — Anesthesia Preprocedure Evaluation (Signed)
Anesthesia Evaluation  Patient identified by MRN, date of birth, ID band Patient awake    Reviewed: Allergy & Precautions, H&P , Patient's Chart, lab work & pertinent test results  Airway Mallampati: III TM Distance: >3 FB Neck ROM: full    Dental No notable dental hx. (+) Teeth Intact   Pulmonary asthma ,  breath sounds clear to auscultation  Pulmonary exam normal       Cardiovascular negative cardio ROS  Rhythm:regular Rate:Normal     Neuro/Psych negative neurological ROS  negative psych ROS   GI/Hepatic negative GI ROS, Neg liver ROS,   Endo/Other  Morbid obesity  Renal/GU negative Renal ROS  negative genitourinary   Musculoskeletal   Abdominal Normal abdominal exam  (+)   Peds  Hematology negative hematology ROS (+)   Anesthesia Other Findings   Reproductive/Obstetrics (+) Pregnancy                           Anesthesia Physical Anesthesia Plan  ASA: III  Anesthesia Plan: Epidural   Post-op Pain Management:    Induction:   Airway Management Planned:   Additional Equipment:   Intra-op Plan:   Post-operative Plan:   Informed Consent: I have reviewed the patients History and Physical, chart, labs and discussed the procedure including the risks, benefits and alternatives for the proposed anesthesia with the patient or authorized representative who has indicated his/her understanding and acceptance.     Plan Discussed with: Anesthesiologist and Surgeon  Anesthesia Plan Comments:         Anesthesia Quick Evaluation

## 2011-05-01 NOTE — Progress Notes (Signed)
Calm with uc, agrees to arm forebag and IUPC O VSS      fhts 120-130s LTV mod      uc q 2-4      abd soft, gravid, nt     Vag 4 990 -1/-2 VTX large amount clear fluid A not in active labor      Probable inadequate uc P titrate IV Pitocin discussed definition of adquate uc use of IUPC, continue care. Lavera Guise, CNM

## 2011-05-01 NOTE — Progress Notes (Signed)
States contractions are hurting in back now, last slept at 0200, denies needs O VSS     Fhts 120s LTV mod accels     abd soft, gravid, nt     uc q 2-4 mild to mod     Vag not assessed A 38 week IUP SROM P continue to titrate IV pitocin, discussed positioning for comfort plans to walk, discussed will not check cervix often due to srom and little progress today, IV meds and epidural discussed. Lavera Guise, CNM

## 2011-05-01 NOTE — MAU Note (Signed)
HSteelman, CNM notified pt in MAU with ctx's, 38weeks, cervix 3/60%. EFM tracing reactive. CNM will come see pt shortly.

## 2011-05-01 NOTE — Progress Notes (Signed)
  Subjective: Aware of contractions as painful, but more irregular now.  Has been up walking.  Objective: BP 113/57  Pulse 91  Temp(Src) 98.1 F (36.7 C) (Axillary)  Resp 16  Ht 5\' 7"  (1.702 m)  Wt 267 lb 4 oz (121.224 kg)  BMI 41.86 kg/m2  SpO2 100%  LMP 08/08/2010      FHT:  Category 1 UC:   q 5 min, mild/mod SVE:   Dilation: 4 Effacement (%): 60 Station: -2 Exam by:: Manfred Arch CNM Cervix very soft and loose, (4-5), BBOW, but vtx -2, not well-applied.   Assessment / Plan: Prolonged latent/early labor, documented cervical change since arrival Fetal anomaly Placental cyst Reviewed status with patient and family--discussed augmentation vs d/c home. Patient and family want to stay.  R&B of augmentation reviewed--patient wishes to proceed. Will start low-dose pitocin. Anticipate AROM as vtx descends.  Nigel Bridgeman 05/01/2011, 12:43 PM

## 2011-05-01 NOTE — Progress Notes (Signed)
Comfortable with epidural, drowsy O VSS      Fhts category 1      abd soft, gravid, nt      uc q 1-5 adequate MVUs but at times coupling of uc      Vag 4 100 -1 VTX R clear A  Adequate MVUs P discussed adequate uc with pt and family with efface now at 100% plan continue care and encourage to sleep. Lavera Guise, CNM

## 2011-05-01 NOTE — Anesthesia Procedure Notes (Signed)
Epidural Patient location during procedure: OB Start time: 05/01/2011 9:52 PM  Staffing Anesthesiologist: Gennaro Lizotte A. Performed by: anesthesiologist   Preanesthetic Checklist Completed: patient identified, site marked, surgical consent, pre-op evaluation, timeout performed, IV checked, risks and benefits discussed and monitors and equipment checked  Epidural Patient position: sitting Prep: site prepped and draped and DuraPrep Patient monitoring: continuous pulse ox and blood pressure Approach: midline Injection technique: LOR air  Needle:  Needle type: Tuohy  Needle gauge: 17 G Needle length: 9 cm Needle insertion depth: 8 cm Catheter type: closed end flexible Catheter size: 19 Gauge Catheter at skin depth: 13 cm Test dose: negative and Other  Assessment Events: blood not aspirated, injection not painful, no injection resistance, negative IV test and no paresthesia  Additional Notes Patient identified. Risks and benefits discussed including failed block, incomplete  Pain control, post dural puncture headache, nerve damage, paralysis, blood pressure Changes, nausea, vomiting, reactions to medications-both toxic and allergic and post Partum back pain. All questions were answered. Patient expressed understanding and wished to proceed. Sterile technique was used throughout procedure. Epidural site was Dressed with sterile barrier dressing. No paresthesias, signs of intravascular injection Or signs of intrathecal spread were encountered.  Patient was more comfortable after the epidural was dosed. Please see RN's note for documentation of vital signs and FHR which are stable.

## 2011-05-01 NOTE — MAU Note (Signed)
Pt states ctx's worsened at 0230 am, unsure of ctx pattern now, notes bloody show, denies lof. +FM in triage room. Was 2/60 vertex per last exam yesterday.

## 2011-05-01 NOTE — Progress Notes (Signed)
  Subjective: Called out with SROM at 3:10pm--light MSF noted.  Aware of contractions, but not very uncomfortable.  Objective: BP 122/70  Pulse 85  Temp(Src) 98.5 F (36.9 C) (Axillary)  Resp 16  Ht 5\' 7"  (1.702 m)  Wt 267 lb 4 oz (121.224 kg)  BMI 41.86 kg/m2  SpO2 100%  LMP 08/08/2010      FHT:  Category 1 UC:  q 3-4 min, mild. Leaking light MSF, small amount VE deferred at present.  Pitocin on 4 mu/min  Assessment / Plan: Early labor, now SROM Will continue to observe.    Nigel Bridgeman 05/01/2011, 3:17 PM

## 2011-05-01 NOTE — H&P (Signed)
[name] is a 21 y.o. female presenting with contractions all night, small amount bloody show.  Reports +FM, denies leaking.  Cervix was 2 cm yesterday, 80% in office.  Pregnancy remarkable for: 1. 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 Placental mass (solid and cystic initially, but predominately cystic late 3rd trimester)  3. H/o stds  4. H/o asthma  5. Obese  6. 20y.o.a.  7. H/o mild fetal renal pyelectasis, but has resolved on f/u interval growth scans at [facility].  Transferred in to [facility] at 25 weeks  History of present pregnancy: Patient entered care at 9 weeks at [facility], then transferred to [facility] at 25 weeks.  18 week US showed a questionable placental cyst and limited view of the nasal bone area.  EDC of 05/15/11 was established by LMP.  Patient was referred to MFM from [facility] due to placental cyst, and subsequently also followed there due to presence of anomaly of fetal nose (possibly bifid nose).  MRI showed patent airway.  Plastic surgery was consulted, along with NICU and MFM at [facility] determined that the infant would have services necessary available at [facility], and that vaginal delivery would be appropropriate.  Further ultrasounds were done during pregnancy at [facility], with the last one there at 04/11/11.  Other than related to the fetal facial anomaly and the placental cyst, her prenatal course was essentially uncomplicated. GBS was negative.  Fetal echo was also normal.  At her last evalution on 04/30/11 at [facility], she was 2 cn, 80%.  OB History    Grav Para Term Preterm Abortions TAB SAB Ect Mult Living   2 0 0 0 1 0 1 0 0 0     #1--2011.  SAB at 8 weeks  Past Medical History  Diagnosis Date  . Asthma   . Gonorrhea 2011  . Chlamydia 2011   Past Surgical  History  Procedure Date  . Knee surgery 2007   Family History: family history includes Diabetes in her maternal grandmother and Hypertension in her maternal grandmother.  There is no history of Anesthesia problems. Social History:  reports that she has quit smoking. She has never used smokeless tobacco. She reports that she does not drink alcohol or use illicit drugs.  ROS:  Contractions every 5 min, small amount spotting, +FM.  VE:  4cm, 70%, vtx, -2 (previously 3 cm, 60% by RN on arrival at 7:30am)  Blood pressure 122/74, pulse 94, temperature 97.8 F (36.6 C), temperature source Oral, resp. rate 20, height 5\' 7"  (1.702 m), weight 267 lb 4 oz (121.224 kg), last menstrual period 08/08/2010, SpO2 100.00%.  Chest clear Heart RRR without murmur Abd gravid, NT Pelvic-as above Ext WNL, 1+ edema  FHR Category 1 UCs q 5 min, mild  Prenatal labs: ABO, Rh:  O+ Antibody:  Neg Rubella:  Immune RPR:   NR HBsAg:   Neg HIV:   Neg GBS:   Negative Glucola WNL Hgb at NOB 12.2   Assessment/Plan: IUP at 38 weeks Early labor Negative GBS Fetal facial anomaly (bifid nose) Placental cyst  Plan: Admit to Birthing Suite per consult with Dr. Stefano Gaul Routine CNM orders Notify nursery of admission and fetal US findings. Pain medication prn  Nigel Bridgeman 05/01/2011, 9:24 AM

## 2011-05-01 NOTE — Progress Notes (Signed)
GC/CHL/GBS all negative

## 2011-05-01 NOTE — Progress Notes (Signed)
Dr. Stefano Gaul updated on pt status in 2100 progress note per telephone, continue care. Lavera Guise, CNM

## 2011-05-01 NOTE — Progress Notes (Signed)
  Subjective: Aware of contractions, but still not uncomfortable.  Objective: BP 122/77  Pulse 84  Temp(Src) 98.7 F (37.1 C) (Axillary)  Resp 16  Ht 5\' 7"  (1.702 m)  Wt 267 lb 4 oz (121.224 kg)  BMI 41.86 kg/m2  SpO2 100%  LMP 08/08/2010      FHT:  Negative CST UC:   q 4 minutes, mild Pitocin on 4 mu/min VE deferred at present.   Assessment / Plan: Continue to augment. Plan NICU at delivery due to fetal facial anomaly. Plan placenta to pathology due to placental cyst noted on [place].  Nigel Bridgeman 05/01/2011, 4:53 PM

## 2011-05-02 ENCOUNTER — Encounter (HOSPITAL_COMMUNITY): Payer: Self-pay | Admitting: Family Medicine

## 2011-05-02 DIAGNOSIS — O358XX0 Maternal care for other (suspected) fetal abnormality and damage, not applicable or unspecified: Secondary | ICD-10-CM

## 2011-05-02 DIAGNOSIS — O43899 Other placental disorders, unspecified trimester: Secondary | ICD-10-CM

## 2011-05-02 LAB — RPR: RPR Ser Ql: NONREACTIVE

## 2011-05-02 MED ORDER — BENZOCAINE-MENTHOL 20-0.5 % EX AERO: 1.0000 "application " | INHALATION_SPRAY | CUTANEOUS | Status: DC | PRN

## 2011-05-02 MED ORDER — DIBUCAINE 1 % RE OINT: 1.0000 "application " | TOPICAL_OINTMENT | RECTAL | Status: DC | PRN

## 2011-05-02 MED ORDER — TETANUS-DIPHTH-ACELL PERTUSSIS 5-2.5-18.5 LF-MCG/0.5 IM SUSP: 0.5000 mL | Freq: Once | INTRAMUSCULAR | Status: DC

## 2011-05-02 MED ORDER — SODIUM BICARBONATE 8.4 % IV SOLN
INTRAVENOUS | Status: DC | PRN
  Administered 2011-05-02: 5 mL via EPIDURAL

## 2011-05-02 MED ORDER — SIMETHICONE 80 MG PO CHEW
80.0000 mg | CHEWABLE_TABLET | ORAL | Status: DC | PRN
Start: 2011-05-02 — End: 2011-05-04

## 2011-05-02 MED ORDER — ONDANSETRON HCL 4 MG PO TABS: 4.0000 mg | ORAL_TABLET | ORAL | Status: DC | PRN

## 2011-05-02 MED ORDER — DIPHENHYDRAMINE HCL 25 MG PO CAPS: 25.0000 mg | ORAL_CAPSULE | Freq: Four times a day (QID) | ORAL | Status: DC | PRN

## 2011-05-02 MED ORDER — IBUPROFEN 600 MG PO TABS
600.0000 mg | ORAL_TABLET | Freq: Four times a day (QID) | ORAL | Status: DC
  Administered 2011-05-02 – 2011-05-04 (×7): 600 mg via ORAL
  Filled 2011-05-02 (×7): qty 1

## 2011-05-02 MED ORDER — SENNOSIDES-DOCUSATE SODIUM 8.6-50 MG PO TABS
2.0000 | ORAL_TABLET | Freq: Every day | ORAL | Status: DC
  Administered 2011-05-02 – 2011-05-03 (×2): 2 via ORAL

## 2011-05-02 MED ORDER — LANOLIN HYDROUS EX OINT: TOPICAL_OINTMENT | CUTANEOUS | Status: DC | PRN

## 2011-05-02 MED ORDER — ZOLPIDEM TARTRATE 5 MG PO TABS: 5.0000 mg | ORAL_TABLET | Freq: Every evening | ORAL | Status: DC | PRN

## 2011-05-02 MED ORDER — WITCH HAZEL-GLYCERIN EX PADS: 1.0000 "application " | MEDICATED_PAD | CUTANEOUS | Status: DC | PRN

## 2011-05-02 MED ORDER — ONDANSETRON HCL 4 MG/2ML IJ SOLN: 4.0000 mg | INTRAMUSCULAR | Status: DC | PRN

## 2011-05-02 MED ORDER — PRENATAL MULTIVITAMIN CH
1.0000 | ORAL_TABLET | Freq: Every day | ORAL | Status: DC
  Administered 2011-05-02 – 2011-05-04 (×3): 1 via ORAL
  Filled 2011-05-02 (×3): qty 1

## 2011-05-02 MED ORDER — OXYCODONE-ACETAMINOPHEN 5-325 MG PO TABS
1.0000 | ORAL_TABLET | ORAL | Status: DC | PRN
  Administered 2011-05-03: 1 via ORAL
  Filled 2011-05-02: qty 1

## 2011-05-02 MED ORDER — BUPIVACAINE HCL (PF) 0.25 % IJ SOLN
INTRAMUSCULAR | Status: DC | PRN
  Administered 2011-05-02: 5 mL
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
## 2011-05-02 NOTE — Anesthesia Postprocedure Evaluation (Signed)
  Anesthesia Post-op Note  Patient: [name]  Procedure(s) Performed: * No procedures listed *  Patient Location: Mother/Baby  Anesthesia Type: Epidural  Level of Consciousness: awake  Airway and Oxygen Therapy: Patient Spontanous Breathing  Post-op Pain: none  Post-op Assessment: Patient's Cardiovascular Status Stable, Respiratory Function Stable, Patent Airway, No signs of Nausea or vomiting, Adequate PO intake, Pain level controlled, No headache, No backache, No residual numbness and No residual motor weakness  Post-op Vital Signs: Reviewed and stable  Complications: No apparent anesthesia complications

## 2011-05-02 NOTE — Progress Notes (Signed)
Vaginal delivery of female infant with noted nasal bifid at 04:09, time approximate.
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
## 2011-05-02 NOTE — Progress Notes (Signed)
Additional assistance requested/ received in room.
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
## 2011-05-02 NOTE — Progress Notes (Signed)
Comfortable with epidural some pressure asleep now with entering the room O VSS      Fhts 125-130 LTV min to mod accels      uc q 1 -5 coupling at times      abd soft between uc      Vag 5 100 -1/0 normal bloody show A active labor P continue care, IV Pit at [facility] [place]

## 2011-05-02 NOTE — Progress Notes (Signed)
UR chart review completed.  

## 2011-05-02 NOTE — Progress Notes (Addendum)
CNM requested to come assess pt status. RN at bedside adjusting cardio.

## 2011-05-03 LAB — CBC
HCT: 34.1 % — ABNORMAL LOW (ref 36.0–46.0)
Hemoglobin: 11.2 g/dL — ABNORMAL LOW (ref 12.0–15.0)
MCH: 26.2 pg (ref 26.0–34.0)
MCHC: 32.8 g/dL (ref 30.0–36.0)
MCV: 79.9 fL (ref 78.0–100.0)
RDW: 14.6 % (ref 11.5–15.5)

## 2011-05-03 MED ORDER — IBUPROFEN 600 MG PO TABS: 600.0000 mg | ORAL_TABLET | Freq: Four times a day (QID) | ORAL | Status: AC | PRN

## 2011-05-03 NOTE — Progress Notes (Addendum)
Post Partum Day 1 Subjective: no complaints, up ad lib, voiding, tolerating PO, + flatus and BM since delivery.  No dizziness today.  BF'ng going well. Vb lighter.  Per pt, scheduled to have geneticist see them today before d/c, and Peds to help schedule f/u w/ Dr. Onalee Hua.  Undecided on [facility].    Objective: Blood pressure 91/68, pulse 60, temperature 98 F (36.7 C), temperature source Oral, resp. rate 18, height 5\' 7"  (1.702 m), weight 121.11 kg (267 lb), last menstrual period 08/08/2010, SpO2 98.00%, unknown if currently breastfeeding.  Physical Exam:  General: alert, cooperative and no distress Lochia: appropriate, rubra Uterine Fundus: firm, below umbilicus Incision: n/a DVT Evaluation: No evidence of DVT seen on physical exam. Negative Homan's sign. No significant calf/ankle edema.   Basename 05/03/11 0520 05/01/11 0945  HGB 11.2* 12.2  HCT 34.1* 35.5*    Assessment/Plan: Discharge home, Breastfeeding, Lactation consult, Social Work consult and Contraception Undecided.  h/o asthma--stable.  Obese.  H/o stds.  Placental mass--sent to Path.   Newborn with known congenital nasal anomaly.    D/c instructions per [facility] pamphlet. Rx Motrin; continue PNV; Colace prn. F/u 6 weeks at [facility] or prn.  Support given r/e newborn's upcoming sx.  At time of d/c, Peds has not yet rounded on pt this morning.     LOS: 2 days   Jarelle Ates H 05/03/2011, 10:52 AM  D/c'd pt in error; thought pt PPD#2, but pt is PPD#1.  Will d/c tomorrow morning 05/04/11.

## 2011-05-03 NOTE — Discharge Instructions (Signed)
Postpartum Care After Vaginal Delivery After you deliver your baby, you will stay in the hospital for 24 to 72 hours, unless there were problems with the labor or delivery, or you have medical problems. While you are in the hospital, you will receive help and instructions on how to care for yourself and your baby. Your doctor will order pain medicine, in case you need it. You will have a small amount of bleeding from your vagina and should change your sanitary pad frequently. Wash your hands thoroughly with soap and water for at least 20 seconds after changing pads and using the toilet. Let the nurses know if you begin to pass blood clots or your bleeding increases. Do not flush blood clots down the toilet before having the nurse look at them, to make sure there is no placental tissue with them. If you had an intravenous (IV), it will be removed within 24 hours, if there are no problems. The first time you get out of bed or take a shower, call the nurse to help you because you may get weak, lightheaded, or even faint. If you are breastfeeding, you may feel painful contractions of your uterus for a couple of weeks. This is normal. The contractions help your uterus get back to normal size. If you are not breastfeeding, wear a supportive bra and handle your breasts as little as possible until your milk has dried up. Hormones should not be given to dry up the breasts, because they can cause blood clots. You will be given your normal diet, unless you have diabetes or other medical problems.  The nurses may put an ice pack on your episiotomy (surgically enlarged opening), if you have one, to reduce the pain and swelling. On rare occasions, you may not be able to urinate and the nurse will need to empty your bladder with a catheter. If you had a postpartum tubal ligation ("tying tubes," female sterilization), it should not make your stay in the hospital longer. You may have your baby in your room with you as much as  you like, unless you or the baby has a problem. Use the bassinet (basket) for the baby when going to and from the nursery. Do not carry the baby. Do not leave the postpartum area. If the mother is Rh negative (lacks a protein on the red blood cells) and the baby is Rh positive, the mother should get a Rho-gam shot to prevent Rh problems with future pregnancies. You may be given written instructions for you and your baby, and necessary medicines, when you are discharged from the hospital. Be sure you understand and follow the instructions as advised. HOME CARE INSTRUCTIONS   Follow instructions and take the medicines given to you.   Only take over-the-counter or prescription medicines for pain, discomfort, or fever as directed by your caregiver.   Do not take aspirin, because it can cause bleeding.   Increase your activities a little bit every day to build up your strength and endurance.   Do not drink alcohol, especially if you are breastfeeding or taking pain medicine.   Take your temperature twice a day and record it.   You may have a small amount of bleeding or spotting for 2 to 4 weeks. This is normal.   Do not use tampons or douche. Use sanitary pads.   Try to have someone stay and help you for a few days when you go home.   Try to rest or take a nap when   the baby is sleeping.   If you are breastfeeding, wear a good support bra. If you are not breastfeeding, wear a supportive bra and do not stimulate your nipples.   Eat a healthy, nutritious diet and continue to take your prenatal vitamins.   Do not drive, do any heavy activities, or travel until your caregiver tells you it is okay.   Do not have intercourse until your caregiver gives you permission to do so.   Ask your caregiver when you can begin to exercise and what type of exercises to do.   Call your caregiver if you think you are having a problem from your delivery.   Call your pediatrician if you are having a problem  with the baby.   Schedule your postpartum visit and keep it.  SEEK MEDICAL CARE IF:   You have a temperature of 100 F (37.8 C) or higher.   You have increased vaginal bleeding or are passing clots. Save any clots to show your caregiver.   You have bloody urine or pain when you urinate.   You have a bad smelling vaginal discharge.   You have increasing pain or swelling on your episiotomy.   You develop a severe headache.   You feel depressed.   The episiotomy is separating.   You become dizzy or lightheaded.   You develop a rash.   You have a reaction or problems with your medicine.   You have pain, redness, or swelling at the intravenous site.  SEEK IMMEDIATE MEDICAL CARE IF:   You have chest pain.   You develop shortness of breath.   You pass out.   You develop pain, with or without swelling or redness in your leg.   You develop heavy vaginal bleeding, with or without blood clots.   You develop stomach pain.   You develop a bad smelling vaginal discharge.  MAKE SURE YOU:   Understand these instructions.   Will watch your condition.   Will get help right away if you are not doing well or get worse.  Document Released: 11/04/2006 Document Revised: 12/27/2010 Document Reviewed: 11/16/2008 [employer] Patient Information 2012 [employer] Breastfeeding Challenges Breastfeeding is often the best way to feed your baby. Challenges may discourage you from breastfeeding. But solutions can usually be found to help you. Some babies have conditions that may interfere with or make breastfeeding more difficult. But, in all of the following cases, breastfeeding is still best for a baby's health.  ADVANTAGES OF BREASTFEEDING  Breastfed babies tend to be healthier and less affected by disease.   Breastfed babies may have better brain development and be less likely to be overweight than formulafed babies.   Breastfeeding also benefits the mother. It will give you time to  be close to your baby and helps create a strong bond. It also:   Delays the return of your periods.   Stimulates your uterus to contract back to normal.   Helps you lose some of the weight you gained during pregnancy.   Breastfeeding is also cheaper than formula feeding. It also does not require mixing formula, heating bottles, or washing extra dishes.   Breastfeeding mothers have a lower risk of developing breast cancer.   Breastfeeding should be encouraged in women with gestational diabetes and diabetes type I and type II.  BREASTFEEDING CHALLENGES  Breastfeeding involves taking the time to get to know your baby's patterns and responding to his or her cues. Once breastfeeding is well established, feedings usually become   more regular and more widely spaced. Some mothers do not nurse their babies because they come across problems early on. If at all possible, begin breastfeeding your baby within an hour after delivery. The first milk you produce is called colostrum. It is packed with nutrients and disease-fighting substances. These will help nourish and protect your baby against infections as he or she grows up.   Some babies are unable to breastfeed because of premature birth and small size along with weakness and difficulty sucking. Sometimes with birth defects of the mouth (cleft lip or cleft palate) the mother may be able to pump breastmilk to give to her baby. Some digestive problems (breast milk jaundice, galactosemia) may be reasons not to nurse. See a lactation consultant if you have a breast infection or breast abscess, breast cancer or other cancer, previous surgery or radiation treatment, or inadequate milk supply (uncommon).  SOME MOTHERS ARE ADVISED NOT TO BREASTFEED DUE TO HEALTH PROBLEMS SUCH AS:  Serious illnesses.   Severe malnutrition.   Undergoing radiation therapy.   Taking psychiatric medication.   Active herpes lesions on the breast.   Chickenpox or shingles.    Active, untreated tuberculosis.   HIV (human immunodeficiency virus) infection.   Drug or alcohol addiction.   Undergoing radioactive iodine therapy.   Leukemia human cell Type I or II.  BREASTFEEDING SHOULD NOT BE PAINFUL  It is natural for minor problems to arise in first time breastfeeding. Problems you may have and some solutions are as follows:   Nipple soreness may be caused by:   Improper position of baby.   Improper feeding techniques.   Improper nipple care.   For many women, there is no identified cause. A simple change in your baby's position while feeding may relieve nipple soreness. Some breastfeeding mothers report nipple soreness only during the initial adjustment period.   If there is tenderness at first, it should gradually go away as the days go by. Poor latch-on and positioning are common causes of sore nipples. This is usually because the baby is not getting enough of the areola into his or her mouth, and is sucking mostly on the nipple. The areola is the colored portion around the nipple. In general, nurse early and often. Nurse with the nipple and areola in the baby's mouth, not just the nipple. And feed your baby on demand.   If you have sore nipples and put off feedings because of the pain, this can lead to your breasts becoming overly full. This may lead to plugged milk ducts in the breast followed by engorgement or even infection of the breasts. If your baby is latched on correctly, he or she should be able to nurse as long as needed without causing any pain. If it hurts, take the baby off of your breast and try again. Ask for help if it is still painful for you.   Check the positioning of your baby's body and the way she latches on and sucks. To minimize soreness, your baby's mouth should be open wide with as much of the areola in his or her mouth as possible. You should find that it feels better right away once the baby is positioned correctly.   Do not  delay feedings. Try to relax so your let-down reflex comes easily. You also can hand-express a little milk before beginning the feeding so your baby does not clamp down harder, waiting for the milk to come.   If your nipples are very sore, it   may be helpful to change positions each time you nurse. This puts the pressure on a different part of the nipple.   After nursing, you can also express a few drops of milk and gently rub it on your nipples. Human milk has natural healing properties. Let your nipples air-dry after feeding, or wear a soft-cotton shirt.   Wearing a nipple shield during nursing will not relieve sore nipples. They actually can prolong soreness by making it hard for the baby to learn to nurse without the shield.   Avoid wearing bras or clothes that are too tight and put pressure on your nipples. If you wear a bra, get one that offers good support to your breasts.   Change nursing pads often to avoid trapping in moisture. Only use cotton pads.   Avoid using soap, ointments or other chemicals on your nipples. Make sure to avoid products that must be removed before nursing. Washing with clean water is all that is necessary to keep your nipples and breasts clean.   Try rubbing pure lanolin on your nipples after breastfeeding to soothe the pain.   Making sure you get enough rest, eating healthy foods, and getting enough fluids also can help the healing process. If you have very sore nipples, you can ask your caregiver about using non-aspirin pain relievers.   Another cause of sore nursing is a condition called thrush. This is a fungal infection that can form on your nipples from the milk. Other signs of thrush include itching, flaking and drying skin, tender or pink skin. The infection also can form in the baby's mouth from having contact with your nipples. There it appears as little white spots on the inside of the cheeks, gums, or tongue. It also can appear as a diaper rash on your  baby that will not go away by using regular diaper rash ointments. If you have any of these symptoms or think you have thrush, contact your doctor and your baby's doctor, or a lactation consultant. A lactation consultant is a breastfeeding consultant or expert. You can get medication for your nipples and for your baby.   If you still have sore nipples after following the above tips, you may need to see someone who is trained in breastfeeding, like a lactation consultant.  ENGORGEMENT Engorgement is a condition after pregnancy, when your breasts feel very hard and painful. You also may have breast swelling, tenderness, warmth, redness, throbbing and flattening of the nipple. Engorgement may cause a low-grade fever. This can be confused with a breast infection. Engorgement is the result of the milk building up. It usually happens during the third to fifth day after birth. This slows circulation. When blood and lymph move through the breasts, fluid from the blood vessels can seep into the breast tissues. All of the following can cause engorgement.  Poor positioning.   Infrequent feedings.   Giving supplementary bottles of water, juice, formula, or breast milk or using a pacifier. All of these cut down on your feeding and may lead to engorgement.   Changing the breastfeeding schedule with decreasing in feeding.   The baby changes the nursing pattern.   Having a baby with a weak suck who is not able to nurse effectively.   Fatigue, stress, or anemia in the mother.   An overabundant milk supply.   Nipple damage.   Breast abnormalities.  Engorgement can lead to plugged ducts or a breast infection. So it is important to try to prevent it before   this happens. If treated properly, engorgement should only last for one to two days.  Minimize engorgement by making sure the baby is latched on and positioned correctly at your breast.   Nurse frequently after birth. Allow the baby to nurse as long as  he/she likes, as long as he/she is latched on well and sucking effectively.   In the early days when your milk is coming in, you should awaken a sleepy baby every 2 to 3 hours to breastfeed. Breastfeeding often on the affected side helps to remove the milk and keeps milk moving freely. This prevents overfilling of the breast.   Avoid additional bottles and pacifiers.   Try hand expressing or pumping a little milk to first soften the breast, areola, and nipple before breastfeeding. Or massage the breast before feeding.   Cold compresses in between feedings can help ease pain and swelling.   If you are returning to work, try to pump your milk on the same schedule your baby was fed.   Eat a well balanced diet and drink plenty of fluids.   Use a well-fitting, supportive bra that is not too tight.   If your engorgement lasts for more than two days even after treating it, contact a lactation consultant.   Use a breast pump to keep up with your nursing schedule.   Use a breast pump if your baby is not taking enough milk or you feel you may be getting engorged.  PLUGGED DUCTS AND INFECTION Plugged ducts and breast infection (mastitis) are common sources of sore breasts postpartum. It is common for many women to have a plugged duct in the breast at some point if breastfeeding.   A plugged milk duct feels like a tender, sore, lump in the breast. It is not accompanied by a fever or other symptoms. It happens when a milk duct does not properly drain. Then, pressure builds up behind the plug, and surrounding tissue becomes inflamed. A plugged duct usually only occurs in one breast at a time.   A breast infection (mastitis), on the other hand, is soreness or a lump in the breast that can be accompanied by a fever and/or flu-like symptoms. You may feel run down or very achy. Some women with a breast infection also have nausea and vomiting. You also may have yellowish discharge from the nipple that looks  like colostrum. Or the breasts feel warm or hot to the touch and appear pink or red. A breast infection can occur when other family members have a cold or the flu. Like a plugged duct, it usually only occurs in one breast. It is not always easy to tell the difference between a breast infection and a plugged duct. Both have similar symptoms and can improve within 24 to 48 hours.   Treatment for plugged ducts and breast infections is similar. But some breast infections need to also be treated with an antibiotic.   If mastitis is not treated quickly, it may lead to a breast abscess.   It may help to massage the area, starting behind the sore spot. Use your fingers in a circular motion and massage toward the nipple.   Breastfeed more often on the affected side. This helps loosen the plug, keeps the milk moving freely, and the breast from becoming overly full. Nursing every two hours, both day and night on the affected side first can be helpful.   Getting extra sleep or relaxing with your feet up can help speed healing. Often   a plugged duct or breast infection is the first sign that a mother is doing too much and becoming overly tired.   Wear a well-fitting supportive bra that is not too tight, since this can constrict milk ducts.   If you do not feel better within 24 hours of trying these steps, and you have a fever or your symptoms worsen, call your doctor. You may need an antibiotic. Also, if you have a breast infection in which both breasts look affected, or there is pus or blood in the milk, red streaks near the area, or your symptoms came on severely and suddenly, see your doctor right away.   Even if you need an antibiotic, continuing to breastfeed during treatment is best for both you and your baby. Most antibiotics will not affect your baby through your breast milk.  THRUSH Thrush (yeast) is a fungal infection that can form on your nipples or in your breast because it thrives on milk. The  infection forms from an overgrowth of the candida organism. Candida usually exists in our bodies and is kept at healthy levels by the natural bacteria in our bodies. But, when the natural balance of bacteria is upset, candida can overgrow, causing an infection. Some of the things that can cause thrush include:   Having an overly moist environment on your skin or nipples that are sore or cracked.   Taking antibiotics, birth control pills or steroids.   Having a diet that contains large amounts of sugar or foods with yeast.   Having a chronic illness like HIV infection, diabetes, or anemia.  If you have sore nipples that last more than a few days even after you make sure your baby's latch and positioning is correct, or you suddenly get sore nipples after several weeks of unpainful nursing, you could have thrush. Some other signs of thrush include:   Pink, flaky, shiny, itchy or cracked nipples.   Deep pink and blistered nipples.   You also could have shooting pains deep in the breast during or after feedings, or achy breasts.  The infection also can form in your baby's mouth from having contact with your nipples, and appear as little white spots on the inside of the cheeks, gums, or tongue. It also can appear as a diaper rash (small red dots around a rash) on your baby that will not go away by using regular diaper rash ointments. Many babies with thrush refuse to nurse, or are gassy or cranky.  Solution:   If you or your baby have any of these symptoms, contact your doctor and your baby's doctor so you both can be correctly diagnosed.   You can get medication for your nipples and for your baby. Medication for a mother is usually an ointment for the nipples. Your baby can be given a liquid medication for his/her mouth, and/or an ointment for any diaper rash.   Change disposable nursing pads often. Wash any towels or clothing that come in contact with the yeast in very hot water (above 122 F or  50 C).   Wear a clean bra every day. Wash your hands often, and wash your baby's hands often, especially if he or she sucks on his/her fingers.   Only use cotton pads.   Boil any pacifiers, bottle nipples, or toys your baby puts in his or her mouth once a day for 20 minutes to kill the thrush. After one week of treatment, discard pacifiers and nipples and buy new ones.     Boil daily for 20 minutes all breast pump parts that touch the milk.   Make sure other family members are free of thrush or other fungal infections. If they have symptoms, get them treatment.  NURSING STRIKE A nursing strike is when your baby has been nursing well for months, then suddenly loses interest in breastfeeding and begins to refuse the breast. A nursing strike can mean several things are happening with your baby and that she or he is trying to communicate with you to let you know that something is wrong. Not all babies will react the same to different situations that can cause a nursing strike. Some will continue to breastfeed without a problem, others may just become fussy at the breast, and others will refuse the breast entirely. Some of the major causes of a nursing strike include:  Mouth pain from teething, a fungal infection, or a cold sore.   An ear infection.   Pain from a certain nursing position.   Being upset about a long separation from the mother or a major change in routine.   Being interested in other things around him or her.   A cold or stuffy nose that makes breathing difficult.   Reduced milk supply from supplementing with bottles or overuse of a pacifier.   Responding to the mother's strong reaction if the baby has bitten her.   Being upset about hearing arguing or people talking in a harsh voice with other family members while nursing.   Reacting to stress, over-stimulation, or having been repeatedly put off when wanting to nurse.   If your baby is on a nursing strike, it is normal to  feel frustrated and upset, especially if your baby is unhappy. It is important not to feel guilty or that you have done something wrong. Your breasts also may become uncomfortable as the milk builds up.  Treatment  Try to express your milk manually or with a breast pump on the same schedule as the baby used to breastfeed to avoid engorgement and plugged ducts.   Try another feeding method temporarily to give your baby your milk, such as a cup, dropper, or spoon. Keep track of your baby's wet diapers to make sure he/she is getting enough milk (5 to 6 per day).   Keep offering your breast to the baby. If the baby is frustrated, stop and try again later. Try when the baby is sleeping or very sleepy.   Try various breastfeeding positions.   Focus on the baby with all of your attention and comfort him or her with extra touching and cuddling.   Try nursing while rocking and in a quiet room free of distractions.  INVERTED OR LARGE NIPPLES Some women have nipples that naturally are inverted, or that turn inward instead of protruding, or that are flat and do not protrude. Inverted or flat nipples can sometimes make it harder to breastfeed because your baby can have a harder time latching on. But remember that for breastfeeding to work, your baby has to latch on to both the nipple and the breast, so even inverted nipples can work just fine. Very large nipples can make it hard for the baby to get enough of the areola into his or her mouth to compress the milk ducts and get enough milk.  Know what type of nipples you have before you have your baby, so you can be prepared in case you have a problem getting your baby to latch on correctly.     Talk with a lactation consultant at the hospital or at a breastfeeding clinic for extra help if you have flat, inverted, or very large nipples.   Sometimes a lactation consultant can help inverted nipples to be pulled out with a small device before your baby is brought to  your breast.   In many cases, inverted nipples will protrude more as the baby starts to latch on and as time passes. The baby's sucking will help.   Flat nipples cause fewer problems than inverted nipples. Good latch-on and positioning are usually enough to ensure that a baby latched to a flat nipple breastfeeds well.   The latch for babies of mothers with very large nipples will improve with time as the baby grows. In some cases, it might take several weeks to get the baby to latch well. A good milk supply helps insure that her baby will get enough milk.  RETURNING TO WORK More and more women are breastfeeding when they return to work because they believe in the benefits of breastfeeding. You can purchase or rent effective breast pumps and storage containers for your milk. Many employers are willing to set up special rooms for mothers who pump. But others are not as educated about the benefits of breastfeeding. Also, many women are not able to take off as much time as they'd like after having their babies and might have to return to work before breastfeeding is well established. After you have your baby, take as much time off as possible. This will help you get your breastfeeding established well and may reduce the number of months you may need to pump your milk while you are at work.   If you plan to have your baby take a bottle of expressed breast milk while you are at work, you can introduce your baby to a bottle when he or she is around four weeks old. Otherwise, the baby might not accept the bottle later on. Once your baby is comfortable taking a bottle, it is a good idea to have Dad or another family member offer a bottle of pumped breast milk on a regular basis so the baby stays in practice.   Let your employer or human resources manager know that you plan to continue breastfeeding once you return to work. Before you return to work, or even before you have your baby, start talking with your  employer about breastfeeding. Do not be afraid to request a clean and private area where you can pump your milk. If you do not have your own office space, you can ask to use a supervisor's office during certain times. Or you can ask to have a clean, clutter free corner of a storage room. All you need is a chair, a small table, and an outlet if you are using an electric pump. Many electric pumps also can run on batteries and do not require an outlet. You can lock the door and place a small sign on it that asks for some privacy. You can pump your breast milk during lunch or other breaks. You could suggest to your employer that you are willing to make up work time for time spent pumping milk.   After pumping, you can refrigerate your milk, place it in a cooler, or freeze it for the baby to be fed later. Many breast pumps come with carrying cases that have a section to store your milk with ice packs. If you do not have access to a refrigerator, you can leave   it at room temperatures for up to 6 hours.   Many employers are not aware of state laws that state they have to allow you to breastfeed at your job. Under these laws, your employer is required to set up a space for you to breastfeed and/or allow paid/unpaid time for breastfeeding employees. To see if your state has a breastfeeding law for employers, go to http://www.llli.org/Law/LawBills.html or call us at 1-800-LALECHE (in [place]).  JAUNDICE  Jaundice is a condition that is common in many newborns. It appears as a yellowing of the skin and eyes. It is caused by an excess of bilirubin, a yellow pigment that is a product in the blood. All babies are born with extra red blood cells that undergo a process of being broken down and eliminated from the body. Bilirubin levels in the blood can be high because of:  Higher production of it in a newborn.   Increased ability of the newborn intestine to absorb it.   Limited ability of the newborn liver to handle large  amounts of it.  Many cases of jaundice do not need to be treated. Your baby's doctor will carefully monitor your baby's bilirubin levels. Sometimes infants have to be temporarily separated from their mothers to receive special treatment with phototherapy (aiming lights on the baby). In these cases, breastfeeding is encouraged but supplements may also be given to the baby. American Academy of Pediatrics advises against stopping breastfeeding in jaundiced babies and suggests continuing frequent breastfeeding, even during treatment. If your baby is jaundiced or develops jaundice, it is important to discuss with your baby's caregiver all possible treatment options. Share that you do not want to interrupt nursing if this is at all possible.  REFLUX  It is not unusual for babies to spit up after nursing. Usually, babies can spit up and show no other signs of illness. The spitting up disappears as the baby's digestive system matures. As long as the baby has 6 to 8 wet diapers and at least 2 bowel movements in a 24 hour period (under 6 weeks of age), and your baby is gaining weight (at least 4 ounces a week) you can be assured your baby is getting enough milk.  However, some babies have a condition called gastroesophageal reflux (GER). This happens when the muscle at the opening of the stomach opens at the wrong times, allowing milk and food to come back up into the the tube in the throat (esophagus). Symptoms of GER can include:   Crying as if in discomfort.   Waking up frequently at night.   Problems swallowing.   Frequent red or sore throat.   Signs of asthma, bronchitis, wheezing or problems breathing.   Projectile vomiting.   Arching of the back as if in severe pain.   Slow weight gain.   Gagging or choking.   Frequent hiccupping or burping.   Severe spitting up, or spitting up after every feeding, or hours after eating.   Refusal to eat.  Many healthy babies might have some of these  symptoms and do not have GER. But there are babies who might only have a few of these symptoms and have a severe case of GER. Not all babies with GER spit up or vomit.  Some babies with GER do not have a serious medical problem. But caring for them can be hard since they tend to be very fussy and wake up frequently at night. More severe cases of GER may need to be treated   with medication if the baby, in addition to spitting up, also refuses to nurse, gains weight poorly or is losing weight, or has periods of gagging or choking.   If your baby spits up after every feeding and has any of the other symptoms mentioned above, it is best to see his or her doctor for a correct diagnosis. Other than GER, your baby could have another condition that needs treatment. If there are no other signs of illness, he/she could just be sensitive to a food in your diet or a medication he/she is receiving. If your baby has GER, it is important to try to continue to breastfeed since breast milk still is more easily digested than formula. Try smaller, more frequent feedings, thorough burping, and putting the baby in an upright position during and after feedings.  CLEFT PALATE AND CLEFT LIP  Cleft palate and cleft lip are some of the most common birth defects that happen as a baby is developing in the womb. A cleft, or opening, in either the palate or lip can happen together or separately and both can be corrected through surgery. Both conditions can prevent babies from breastfeeding because a baby cannot form a good seal around the nipple and areola with his or her mouth, or get milk out of the breast well.   Cleft palate can happen on one or both sides of a baby's mouth and be partial or complete. Right after birth, a mother whose baby has a cleft palate can try to breastfeed her baby, and she can start expressing her milk right away to keep up her supply. Even if her baby cannot latch on well to her breast, the baby can be fed  breast milk by cup. In some hospitals, babies with cleft palate are fitted with a mouthpiece called an obturator. This fits into the cleft and seals it for easier feeding. The baby should be able to exclusively breastfeed after surgery.   Cleft lip can happen on one or both sides of a baby's lip. But a mother can try different breastfeeding positions and use her thumb or breast to help fill in the opening left by the lip to form a seal around the breast. With cleft lip repair, breastfeeding may only have to be stopped for a few hours.   If your baby is born with a cleft palate or cleft lip, talk with a lactation consultant in the hospital for assistance as soon as possible. Human milk and early breastfeeding is still best for your baby's health.  TWINS OR MULTIPLES Mothers of twins or multiples might feel overwhelmed with the idea of breastfeeding more than one baby at a time. The benefits of human milk to both these mothers and babies are the same as for all mothers and babies. When breastfeeding twins, your breast milk will increase to the amount the babies will need. You will have to take in more calories and liquids when nursing twins. If the babies are premature and unable to nurse, you can pump your breasts and freeze the milk until the babies are ready to nurse. But mothers of multiples get even more benefits from breastfeeding:  Their uterus contracts, which is helpful because it has stretched even more to hold more than one baby.   Hormones are released that relax the mother, which is helpful with the added stress of caring for more than one baby.   Eight to ten hours per week are saved because there is no need to prepare   formula or bottles and the mother's milk is available right away.   Breastfeeding early and often for a mother of multiples is important to keep up her milk supply. A good latch-on for each baby is important to avoid sore nipples. Many mothers find that it is easier to nurse  the babies together rather than separately, and that it gets easier as the babies get older. There are many breastfeeding holds that make it easier to nurse more than one baby at a time. If you are having multiples, talk with a lactation consultant about more ways you can successfully breastfeed your babies.  BREASTFEEDING DURING PREGNANCY While most mothers who are nursing a toddler stop breastfeeding if they find out they are pregnant, it is an individual choice to decide whether to keep breastfeeding during the pregnancy. It is not unsafe for the unborn child if you continue to breastfeed an older child during this time. But, if you are having some problems in your pregnancy such as uterine pain or bleeding, a history of preterm labor or problems gaining weight during pregnancy, your doctor may advise you to wean. Your child also may decide to wean on his or her own because pregnancy changes the amount and flavor of your milk. Some women also choose to wean at this time because they have nipple soreness caused by pregnancy hormones, are nauseous, tire more easily, or find that their growing stomachs make breastfeeding uncomfortable. You will need more calories when you breastfeed while pregnant. Your milk production usually slows down around the fourth month of pregnancy.  BREASTFEEDING AFTER BREAST SURGERY   If you have had breast surgery, including breast implants, you might be worried about whether you will be able to breastfeed. The most important things that affect whether you can produce enough milk for your baby are how your surgery was done and where your incisions are, and the reasons for your surgery. For example, women who have had incisions in the fold under the breasts are less likely to have problems producing milk than women who have had incisions around or across the areola. Incisions around the areola can cut into milk ducts and nerves, where milk is produced and travels. Women who have had  breast surgery to augment breasts that never fully developed may not have enough glands to produce a full milk supply.   If you had breast surgery and are worried about how it will affect breastfeeding, talk with a lactation consultant. If you are planning breast surgery and are worried about how it will affect breastfeeding, talk with your surgeon about ways he or she can preserve as much of the breast tissue and milk ducts as possible.  INDUCING LACTATION  Many mothers who adopt want to breastfeed their babies and can do it successfully with some help. It is successful ? to  of the time it is tried. Many will need to supplement their breast milk with donated breast milk or infant formula. But some adoptive mothers can breastfeed exclusively, especially if they have been pregnant before. Lactation is a hormonal response to a physical action. So the stimulation of the baby nursing causes the body to see a need for and produce milk. The more the baby nurses, the more a woman's body will produce milk.   Beginning a combination of hormone treatment several months before the baby is born should help facilitate lactation in many cases.   One thing you can do to prepare is to pump every 3 hours   around the clock for two to three weeks before your baby arrives. Or you can wait until the baby arrives and starts to nurse. Breast milk can be frozen until you are ready to nurse the baby. A supplemental nursing system (SNS) or a lactation aid can help ensure that your baby gets enough nutrition and that your breasts are stimulated to produce milk at the same time.   If you are an adoptive mother who wants to breastfeed, you should see both a lactation consultant and your doctor for help in establishing an initial milk supply.  FOR MORE INFORMATION [employer]: www.llli.org Document Released: 07/01/2005 Document Revised: 12/27/2010 Document Reviewed: 09/23/2007 ExitCare Patient Information 2012  [employer]. Contraception Choices Contraception (birth control) is the use of any methods or devices to prevent pregnancy. Below are some methods to help avoid pregnancy. HORMONAL METHODS   Contraceptive implant. This is a thin, plastic tube containing progesterone hormone. It does not contain estrogen hormone. Your caregiver inserts the tube in the inner part of the upper arm. The tube can remain in place for up to 3 years. After 3 years, the implant must be removed. The implant prevents the ovaries from releasing an egg (ovulation), thickens the cervical mucus which prevents sperm from entering the uterus, and thins the lining of the inside of the uterus.   Progesterone-only injections. These injections are given every 3 months by your caregiver to prevent pregnancy. This synthetic progesterone hormone stops the ovaries from releasing eggs. It also thickens cervical mucus and changes the uterine lining. This makes it harder for sperm to survive in the uterus.   Birth control pills. These pills contain estrogen and progesterone hormone. They work by stopping the egg from forming in the ovary (ovulation). Birth control pills are prescribed by a caregiver.Birth control pills can also be used to treat heavy periods.   Minipill. This type of birth control pill contains only the progesterone hormone. They are taken every day of each month and must be prescribed by your caregiver.   Birth control patch. The patch contains hormones similar to those in birth control pills. It must be changed once a week and is prescribed by a caregiver.   Vaginal ring. The ring contains hormones similar to those in birth control pills. It is left in the vagina for 3 weeks, removed for 1 week, and then a new one is put back in place. The patient must be comfortable inserting and removing the ring from the vagina.A caregiver's prescription is necessary.   Emergency contraception. Emergency contraceptives prevent  pregnancy after unprotected sexual intercourse. This pill can be taken right after sex or up to 5 days after unprotected sex. It is most effective the sooner you take the pills after having sexual intercourse. Emergency contraceptive pills are available without a prescription. Check with your pharmacist. Do not use emergency contraception as your only form of birth control.  BARRIER METHODS   Female condom. This is a thin sheath (latex or rubber) that is worn over the penis during sexual intercourse. It can be used with spermicide to increase effectiveness.   Female condom. This is a soft, loose-fitting sheath that is put into the vagina before sexual intercourse.   Diaphragm. This is a soft, latex, dome-shaped barrier that must be fitted by a caregiver. It is inserted into the vagina, along with a spermicidal jelly. It is inserted before intercourse. The diaphragm should be left in the vagina for 6 to 8 hours after   intercourse.   Cervical cap. This is a round, soft, latex or plastic cup that fits over the cervix and must be fitted by a caregiver. The cap can be left in place for up to 48 hours after intercourse.   Sponge. This is a soft, circular piece of polyurethane foam. The sponge has spermicide in it. It is inserted into the vagina after wetting it and before sexual intercourse.   Spermicides. These are chemicals that kill or block sperm from entering the cervix and uterus. They come in the form of creams, jellies, suppositories, foam, or tablets. They do not require a prescription. They are inserted into the vagina with an applicator before having sexual intercourse. The process must be repeated every time you have sexual intercourse.  INTRAUTERINE CONTRACEPTION  Intrauterine device (IUD). This is a T-shaped device that is put in a woman's uterus during a menstrual period to prevent pregnancy. There are 2 types:   Copper IUD. This type of IUD is wrapped in copper wire and is placed inside the  uterus. Copper makes the uterus and fallopian tubes produce a fluid that kills sperm. It can stay in place for 10 years.   Hormone IUD. This type of IUD contains the hormone progestin (synthetic progesterone). The hormone thickens the cervical mucus and prevents sperm from entering the uterus, and it also thins the uterine lining to prevent implantation of a fertilized egg. The hormone can weaken or kill the sperm that get into the uterus. It can stay in place for 5 years.  PERMANENT METHODS OF CONTRACEPTION  Female tubal ligation. This is when the woman's fallopian tubes are surgically sealed, tied, or blocked to prevent the egg from traveling to the uterus.   Female sterilization. This is when the female has the tubes that carry sperm tied off (vasectomy).This blocks sperm from entering the vagina during sexual intercourse. After the procedure, the man can still ejaculate fluid (semen).  NATURAL PLANNING METHODS  Natural family planning. This is not having sexual intercourse or using a barrier method (condom, diaphragm, cervical cap) on days the woman could become pregnant.   Calendar method. This is keeping track of the length of each menstrual cycle and identifying when you are fertile.   Ovulation method. This is avoiding sexual intercourse during ovulation.   Symptothermal method. This is avoiding sexual intercourse during ovulation, using a thermometer and ovulation symptoms.   Post-ovulation method. This is timing sexual intercourse after you have ovulated.  Regardless of which type or method of contraception you choose, it is important that you use condoms to protect against the transmission of sexually transmitted diseases (STDs). Talk with your caregiver about which form of contraception is most appropriate for you. Document Released: 01/07/2005 Document Revised: 12/27/2010 Document Reviewed: 05/16/2010 ExitCare Patient Information 2012 [employer]. 

## 2011-05-04 NOTE — Discharge Summary (Signed)
Obstetric Discharge Summary Reason for Admission: onset of labor Prenatal Procedures: NST and ultrasound Intrapartum Procedures: spontaneous vaginal delivery Postpartum Procedures: none Complications-Operative and Postpartum: none Hemoglobin  Date Value Range Status  05/03/2011 11.2* 12.0-15.0 (g/dL) Final     HCT  Date Value Range Status  05/03/2011 34.1* 36.0-46.0 (%) Final   Hospital Course: Admitted in early labor 05/01/11. Negative GBS. Progressed after AROM and pitocin augmentation, epidural placed.  Had slow progress during active phase, then delivered precipitously on the morning of 05/02/11. Delivery was performed by Lavera Guise, CNM, with infant stabilized after initial need for resuscitation.  Infant had previously expected nasal anomaly, although the airway was patent and the baby had no further difficulty after initial resuscitation.  The plastic surgeon, Dr. Jonna Munro, sent a representative to photograph the baby for her review, and she will follow-up with the family to plan surgical repair at around 3 months postpartum.  Patient and baby had an overall uncomplicated postpartum course, although there were some family issues with the FOB's family.  SW consult was obtained, and no further issues occurred during her stay. FOB was present with the patient on the day of discharge.  Infant remained at mom's bedside during her hospitalizaion.   Breastfeeding ws going well. Mom's physical exam was WNL on the day of discharge, and she was discharged home in stable condition. Contraception plan was undetermined at the time of discharge, and options were reviewed.  She received adequate benefit from po pain medications, and was d/c'd home with Ibuprophen.  The placenta was sent to pathology due to the presence of a placental cyst that was noted prenatally.      Physical Exam:  General: alert Lochia: appropriate Uterine Fundus: firm Incision: Clear DVT Evaluation: No evidence of DVT seen  on physical exam. Negative Homan's sign.  Discharge Diagnoses: Term Pregnancy-delivered and Fetal facial anomaly, placental cyst  Discharge Information: Date: 05/04/2011 Activity: Per [facility] handout Diet: routine Medications: Ibuprofen Condition: stable Instructions: refer to practice specific booklet Discharge to: home Follow-up Information    [facility] up with [facility]. Schedule an appointment as soon as possible for a visit in 6 weeks. (or call as needed with any questions or concerns)          Newborn Data: Live born female  Birth Weight: 5 lb 14.5 oz (2679 g) APGAR: , 4  Home with mother.  [name] 05/04/2011, 8:19 AM

## 2011-05-07 ENCOUNTER — Other Ambulatory Visit: Payer: Medicaid Other

## 2011-05-07 ENCOUNTER — Encounter: Payer: Medicaid Other | Admitting: Obstetrics and Gynecology

## 2011-05-10 ENCOUNTER — Telehealth: Payer: Self-pay | Admitting: Obstetrics and Gynecology

## 2011-05-10 NOTE — Telephone Encounter (Signed)
Routed to triage 

## 2011-05-10 NOTE — Telephone Encounter (Signed)
PC TO PT PER TELEPHONE MESSAGE. PT C/O VAGINAL PAIN X 5DAYS. NO FEVER. C/O PAIN WITH URINATION. WANTS APPT FOR EVAL. PT OPTS TO WAIT TIL MONDAY.  PT INFORMED IF FEVER OCCURS OVER WEEKEND TO CALL OFFICE. APPT SCHED 05/13/11@10 :15A. ADVISED PT TO INC WATER AND CRANBERRY JUICE INTAKE. PT DENIES ABD PAIN OR ABN BLDG. PT AGREES.

## 2011-05-10 NOTE — Telephone Encounter (Signed)
Routed to triage nurse

## 2011-05-13 ENCOUNTER — Other Ambulatory Visit (INDEPENDENT_AMBULATORY_CARE_PROVIDER_SITE_OTHER): Payer: Medicaid Other

## 2011-05-13 ENCOUNTER — Other Ambulatory Visit: Payer: Self-pay

## 2011-05-13 DIAGNOSIS — N390 Urinary tract infection, site not specified: Secondary | ICD-10-CM

## 2011-05-13 LAB — POCT URINALYSIS DIPSTICK
Ketones, UA: NEGATIVE
Protein, UA: NEGATIVE
Spec Grav, UA: <=1.005
Urobilinogen, UA: NEGATIVE
pH, UA: 5

## 2011-05-13 MED ORDER — NITROFURANTOIN MONOHYD MACRO 100 MG PO CAPS: 100.0000 mg | ORAL_CAPSULE | Freq: Two times a day (BID) | ORAL | Status: AC

## 2011-05-13 NOTE — Progress Notes (Signed)
Patient presented to office c/o burning with urination, patient is 1 week postpartum.  Consulted with Almond Lint.  Sent urine for culture and called in Macrobid.

## 2011-05-14 ENCOUNTER — Encounter: Payer: Medicaid Other | Admitting: Obstetrics and Gynecology

## 2011-05-20 ENCOUNTER — Telehealth: Payer: Self-pay | Admitting: Obstetrics and Gynecology

## 2011-05-20 ENCOUNTER — Encounter: Payer: Medicaid Other | Admitting: Obstetrics and Gynecology

## 2011-05-20 MED ORDER — SULFAMETHOXAZOLE-TRIMETHOPRIM 800-160 MG PO TABS: 1.0000 | ORAL_TABLET | Freq: Two times a day (BID) | ORAL | Status: AC

## 2011-05-20 NOTE — Progress Notes (Signed)
Addended by: Malissa Hippo. on: 05/20/2011 12:54 AM   Modules accepted: Orders

## 2011-05-20 NOTE — Telephone Encounter (Signed)
TC to pt. Per SL to inform of change in RX for UTI. "[employer] customer unavailable."

## 2011-05-22 ENCOUNTER — Encounter (HOSPITAL_COMMUNITY): Payer: Self-pay | Admitting: *Deleted

## 2011-05-24 ENCOUNTER — Telehealth: Payer: Self-pay | Admitting: Obstetrics and Gynecology

## 2011-05-24 NOTE — Telephone Encounter (Signed)
PT CALLED, HAD SVD ON 05/02/11, STATES FOR PAST 3 DAYS HAS HAD LOW ABD PAIN AND A "BROWNISH/ORANGE" WITHOUT AN ODOR.  PAIN FEELS LIKE MENSTRUAL CRAMPING AND IS CONSTANT.  PT IS NOT BF-ING, CURRENTLY HAS UTI AND IS TAKING ANTIBXS, IS ON DAY 4.  PT SAYS IS TAKING 400MG  OF IBUPROFEN ONLY.  PT ADVISED TO INCREASE IBUPROFEN TO 600MG  Q6 HRS OR 800MG  Q8 HRS AND TO KEEP MONITORING THE D/C, IT IS LIKELY HER LOCHIA CHANGING COLORS, PT VOICES UNDERSTANDING, WILL CALL OVER WEEKEND OR ON Monday IF NO IMPROVEMENT.

## 2011-05-24 NOTE — Telephone Encounter (Signed)
Routed to triage 

## 2011-05-28 ENCOUNTER — Telehealth: Payer: Self-pay | Admitting: Obstetrics and Gynecology

## 2011-05-28 NOTE — Telephone Encounter (Signed)
TC to pt.  To clarify if obtained new RX.  LM to return call.

## 2011-05-31 ENCOUNTER — Telehealth: Payer: Self-pay | Admitting: Obstetrics and Gynecology

## 2011-05-31 NOTE — Telephone Encounter (Signed)
Message copied by Delon Sacramento on Fri May 31, 2011  3:16 PM ------      Message from: Malissa Hippo.      Created: Mon May 20, 2011 12:55 AM       Changed ABX to bactrim, ordered and sent to pharmacy, please call pt and instruct to switch abx, secondary to ua cx results

## 2011-05-31 NOTE — Telephone Encounter (Signed)
TC TO PT REGARDING MSG BELOW, PT VOICES UNDERSTANDING, PT ADVISED TO CALL OFFICE IF RX IS NOT THERE.  PT ALSO STATES HER ABDOMINAL PAIN AND D/C HAS GOTTEN BETTER.

## 2011-06-12 ENCOUNTER — Encounter: Payer: Self-pay | Admitting: Obstetrics and Gynecology

## 2011-06-12 ENCOUNTER — Ambulatory Visit (INDEPENDENT_AMBULATORY_CARE_PROVIDER_SITE_OTHER): Payer: Medicaid Other | Admitting: Obstetrics and Gynecology

## 2011-06-12 VITALS — BP 108/74 | Temp 98.0°F | Resp 16 | Ht 67.0 in | Wt 249.0 lb

## 2011-06-12 DIAGNOSIS — Z309 Encounter for contraceptive management, unspecified: Secondary | ICD-10-CM

## 2011-06-12 DIAGNOSIS — IMO0001 Reserved for inherently not codable concepts without codable children: Secondary | ICD-10-CM

## 2011-06-12 DIAGNOSIS — N898 Other specified noninflammatory disorders of vagina: Secondary | ICD-10-CM

## 2011-06-12 LAB — POCT WET PREP (WET MOUNT)
Bacteria Wet Prep HPF POC: NEGATIVE
Clue Cells Wet Prep Whiff POC: NEGATIVE
WBC, Wet Prep HPF POC: NEGATIVE

## 2011-06-12 MED ORDER — TERCONAZOLE 0.4 % VA CREA: TOPICAL_CREAM | VAGINAL | Status: DC

## 2011-06-12 MED ORDER — MEDROXYPROGESTERONE ACETATE 150 MG/ML IM SUSP: 150.0000 mg | Freq: Once | INTRAMUSCULAR | Status: DC

## 2011-06-12 NOTE — Patient Instructions (Signed)
Monilial Vaginitis Vaginitis in a soreness, swelling and redness (inflammation) of the vagina and vulva. Monilial vaginitis is not a sexually transmitted infection. CAUSES  Yeast vaginitis is caused by yeast (candida) that is normally found in your vagina. With a yeast infection, the candida has overgrown in number to a point that upsets the chemical balance. SYMPTOMS   White, thick vaginal discharge.   Swelling, itching, redness and irritation of the vagina and possibly the lips of the vagina (vulva).   Burning or painful urination.   Painful intercourse.  DIAGNOSIS  Things that may contribute to monilial vaginitis are:  Postmenopausal and virginal states.   Pregnancy.   Infections.   Being tired, sick or stressed, especially if you had monilial vaginitis in the past.   Diabetes. Good control will help lower the chance.   Birth control pills.   Tight fitting garments.   Using bubble bath, feminine sprays, douches or deodorant tampons.   Taking certain medications that kill germs (antibiotics).   Sporadic recurrence can occur if you become ill.  TREATMENT  Your caregiver will give you medication.  There are several kinds of anti monilial vaginal creams and suppositories specific for monilial vaginitis. For recurrent yeast infections, use a suppository or cream in the vagina 2 times a week, or as directed.   Anti-monilial or steroid cream for the itching or irritation of the vulva may also be used. Get your caregiver's permission.   Painting the vagina with methylene blue solution may help if the monilial cream does not work.   Eating yogurt may help prevent monilial vaginitis.  HOME CARE INSTRUCTIONS   Finish all medication as prescribed.   Do not have sex until treatment is completed or after your caregiver tells you it is okay.   Take warm sitz baths.   Do not douche.   Do not use tampons, especially scented ones.   Wear cotton underwear.   Avoid tight  pants and panty hose.   Tell your sexual partner that you have a yeast infection. They should go to their caregiver if they have symptoms such as mild rash or itching.   Your sexual partner should be treated as well if your infection is difficult to eliminate.   Practice safer sex. Use condoms.   Some vaginal medications cause latex condoms to fail. Vaginal medications that harm condoms are:   Cleocin cream.   Butoconazole (Femstat).   Terconazole (Terazol) vaginal suppository.   Miconazole (Monistat) (may be purchased over the counter).  SEEK MEDICAL CARE IF:   You have a temperature by mouth above 102 F (38.9 C).   The infection is getting worse after 2 days of treatment.   The infection is not getting better after 3 days of treatment.   You develop blisters in or around your vagina.   You develop vaginal bleeding, and it is not your menstrual period.   You have pain when you urinate.   You develop intestinal problems.   You have pain with sexual intercourse.  Document Released: 10/17/2004 Document Revised: 12/27/2010 Document Reviewed: 07/01/2008 Exit[employer] Patient Information 2012 [employer]. 

## 2011-06-12 NOTE — Progress Notes (Signed)
Date of delivery: 05-02-2011 Female Name: [name] Vaginal delivery:yes Cesarean section:no Tubal ligation:no GDM:no Breast Feeding:no Bottle Feeding:yes Post-Partum Blues:pt states the first few weeks but not anymore. Abnormal pap:yes Normal GU function: yes Normal GI function:yes Returning to work:no  EPDS : 5  Vaginal Discharge: Yes Color: Clear Odor: no Itching:yes Thin:yes Thick:no Fever:no Dyspareunia:no Hx PID:no HX STD:yes Pelvic Pain:no Desires Gc/CT:no Desires HIV,RPR,HbsAG:no  Subjective:     [name] is a 21 y.o. female who presents for a postpartum visit. She continues with some light spotting.  Has a discharge with itching I have fully reviewed the prenatal and intrapartum course.    Patient is not sexually active.   The following portions of the patient's history were reviewed and updated as appropriate: allergies, current medications, past family history, past medical history, past social history, past surgical history and problem list.  Review of Systems Pertinent items are noted in HPI.   Objective:    BP 108/74  Temp(Src) 98 F (36.7 C) (Oral)  Resp 16  Ht 5\' 7"  (1.702 m)  Wt 249 lb (112.946 kg)  BMI 39.00 kg/m2  LMP 06/07/2011  Breastfeeding? No  General:  alert, cooperative and no distress     Lungs: clear to auscultation bilaterally  Heart:  regular rate and rhythm, S1, S2 normal, no murmur  Abdomen: soft, non-tender; bowel sounds normal; no masses,  no organomegaly   Vulva:  normal  Vagina: normal vagina  Cervix:  normal  Corpus: normal size, contour, position, consistency, mobility, non-tender  Adnexa:  normal adnexa          Wet pre shows monilia   Assessment:     Normal postpartum exam. Monillia infection  Pap smear due age 19   Plan:     1. Contraception: Depo-Provera injections 2. Terazol 7 3. Follow up in: q 12 wks for Depo  or as needed.and then for aex   Claborn Janusz P MD 06/12/2011 9:44  AM

## 2011-07-16 ENCOUNTER — Telehealth: Payer: Self-pay | Admitting: Obstetrics and Gynecology

## 2011-07-16 NOTE — Telephone Encounter (Signed)
Pt delivered in April, states that this is her 2nd period and it is completely different from the 1st period. Pt c/o heavy bleeding and passing blood clots. Pt also states that she thinks she has been exposed to STDs and would like to be tested. Scheduled appointment with Dr Stefano Gaul tomorrow (07/17/11). Pt voiced understanding.

## 2011-07-16 NOTE — Telephone Encounter (Signed)
Triage/epic 

## 2011-07-17 ENCOUNTER — Ambulatory Visit (INDEPENDENT_AMBULATORY_CARE_PROVIDER_SITE_OTHER): Payer: Medicaid Other | Admitting: Obstetrics and Gynecology

## 2011-07-17 ENCOUNTER — Other Ambulatory Visit: Payer: Self-pay | Admitting: Obstetrics and Gynecology

## 2011-07-17 ENCOUNTER — Encounter: Payer: Self-pay | Admitting: Obstetrics and Gynecology

## 2011-07-17 VITALS — BP 110/70 | Resp 16 | Wt 249.0 lb

## 2011-07-17 DIAGNOSIS — Z2089 Contact with and (suspected) exposure to other communicable diseases: Secondary | ICD-10-CM

## 2011-07-17 DIAGNOSIS — N920 Excessive and frequent menstruation with regular cycle: Secondary | ICD-10-CM

## 2011-07-17 DIAGNOSIS — Z309 Encounter for contraceptive management, unspecified: Secondary | ICD-10-CM

## 2011-07-17 DIAGNOSIS — Z202 Contact with and (suspected) exposure to infections with a predominantly sexual mode of transmission: Secondary | ICD-10-CM

## 2011-07-17 LAB — POCT WET PREP (WET MOUNT)
Bacteria Wet Prep HPF POC: NEGATIVE
Clue Cells Wet Prep Whiff POC: NEGATIVE
Trichomonas Wet Prep HPF POC: NEGATIVE
WBC, Wet Prep HPF POC: NEGATIVE
pH: 5.5

## 2011-07-17 MED ORDER — AZITHROMYCIN 500 MG PO TABS: 1000.0000 mg | ORAL_TABLET | Freq: Once | ORAL | Status: AC

## 2011-07-17 MED ORDER — CEFTRIAXONE SODIUM 1 G IJ SOLR
250.0000 mg | Freq: Once | INTRAMUSCULAR | Status: AC
  Administered 2011-07-17: 250 mg via INTRAMUSCULAR

## 2011-07-17 NOTE — Telephone Encounter (Signed)
Spoke with pharmacy want clarity on rx Zithromax advised pt to take two pill by mouth stat pharmacist voice understanding

## 2011-07-17 NOTE — Progress Notes (Signed)
When did bleeding start: 07/14/11 How  Long: currently How often changing pad/tampon: every 20 mins Bleeding Disorders: no Contraception: no Fibroids: no Hormone Therapy: no New Medications: no Menopausal Symptoms: no Vag. Discharge: no Abdominal Pain: yes Increased Stress: yes  Pt unsure LMP  *Wants STD testing (consent signed) because FOB complained of green discharge from penis & tested positive for Gonorrhea recently   HISTORY OF PRESENT ILLNESS  [name] is a 21 y.o. year old female,G2P1011, who presents for a problem visit. The patient reports that she had a baby in April of 2013.  She is having her first real period.  She is sexually active.  She reports a green discharge from her partner and that she was told that she was exposed to gonorrhea.  She is not allergic to any medications.she has not begun her Depo-Provera contraception.  Subjective:  Heavy period.  No fever or chills.  Objective:  BP 110/70  Resp 16  Wt 249 lb (112.946 kg)  Breastfeeding? No   General: alert, cooperative and no distress Resp: clear to auscultation bilaterally Cardio: regular rate and rhythm, S1, S2 normal, no murmur, click, rub or gallop GI: soft, non-tender; bowel sounds normal; no masses,  no organomegaly  External genitalia: normal general appearance Vaginal: menstrual blood Cervix: normal appearance Adnexa: normal bimanual exam Uterus: normal size shape and consistency  Wet prep: negative  Pregnancy test: Negative  Assessment:  Heavy menstrual cycle Exposure to gonorrhea Contraception  Plan:  Rocephin 150 mg IM today. Azithromycin 1 g by mouth. Depo-Provera 150 mg IM every 12 weeks. Safe sexual practices discussed.  Return to office in 2 week(s).   Leonard Schwartz M.D.  07/17/2011 3:05 PM

## 2011-07-18 LAB — HSV 1 ANTIBODY, IGG: HSV 1 Glycoprotein G Ab, IgG: <0.1 IV

## 2011-07-18 LAB — HIV ANTIBODY (ROUTINE TESTING W REFLEX): HIV: NONREACTIVE

## 2011-07-18 LAB — HEPATITIS C ANTIBODY: HCV Ab: NEGATIVE

## 2011-07-18 LAB — RPR

## 2011-07-18 LAB — GC/CHLAMYDIA PROBE AMP, GENITAL: Chlamydia, DNA Probe: NEGATIVE

## 2011-07-23 ENCOUNTER — Telehealth: Payer: Self-pay | Admitting: Obstetrics and Gynecology

## 2011-07-23 NOTE — Telephone Encounter (Signed)
Triage/epic 

## 2011-07-23 NOTE — Telephone Encounter (Signed)
Spoke with pt rgd msg pt wants test results informed all test wnl except hsv 2 and gc pt states already treated for std in office last week pt has appt 08/07/11 at 9:15 with AVS pt voice understanding

## 2011-07-23 NOTE — Telephone Encounter (Signed)
Triage/res. °

## 2011-08-07 ENCOUNTER — Encounter: Payer: Self-pay | Admitting: Obstetrics and Gynecology

## 2011-08-07 ENCOUNTER — Ambulatory Visit (INDEPENDENT_AMBULATORY_CARE_PROVIDER_SITE_OTHER): Payer: Medicaid Other | Admitting: Obstetrics and Gynecology

## 2011-08-07 VITALS — BP 98/58 | Resp 14 | Ht 66.0 in | Wt 247.0 lb

## 2011-08-07 DIAGNOSIS — B009 Herpesviral infection, unspecified: Secondary | ICD-10-CM

## 2011-08-07 DIAGNOSIS — A5400 Gonococcal infection of lower genitourinary tract, unspecified: Secondary | ICD-10-CM

## 2011-08-07 DIAGNOSIS — A549 Gonococcal infection, unspecified: Secondary | ICD-10-CM

## 2011-08-07 DIAGNOSIS — N926 Irregular menstruation, unspecified: Secondary | ICD-10-CM

## 2011-08-07 MED ORDER — VALACYCLOVIR HCL 500 MG PO TABS: 500.0000 mg | ORAL_TABLET | Freq: Every day | ORAL | Status: DC

## 2011-08-07 NOTE — Patient Instructions (Signed)
Genital Herpes Genital herpes is a sexually transmitted disease. This means that it is a disease passed by having sex with an infected person. There is no cure for genital herpes. The time between attacks can be months to years. The virus may live in a person but produce no problems (symptoms). This infection can be passed to a baby as it travels down the birth canal (vagina). In a newborn, this can cause central nervous system damage, eye damage, or even death. The virus that causes genital herpes is usually HSV-2 virus. The virus that causes oral herpes is usually HSV-1. The diagnosis (learning what is wrong) is made through culture results. SYMPTOMS  Usually symptoms of pain and itching begin a few days to a week after contact. It first appears as small blisters that progress to small painful ulcers which then scab over and heal after several days. It affects the outer genitalia, birth canal, cervix, penis, anal area, buttocks, and thighs. HOME CARE INSTRUCTIONS   Keep ulcerated areas dry and clean.   Take medications as directed. Antiviral medications can speed up healing. They will not prevent recurrences or cure this infection. These medications can also be taken for suppression if there are frequent recurrences.   While the infection is active, it is contagious. Avoid all sexual contact during active infections.   Condoms may help prevent spread of the herpes virus.   Practice safe sex.   Wash your hands thoroughly after touching the genital area.   Avoid touching your eyes after touching your genital area.   Inform your caregiver if you have had genital herpes and become pregnant. It is your responsibility to insure a safe outcome for your baby in this pregnancy.   Only take over-the-counter or prescription medicines for pain, discomfort, or fever as directed by your caregiver.  SEEK MEDICAL CARE IF:   You have a recurrence of this infection.   You do not respond to medications and  are not improving.   You have new sources of pain or discharge which have changed from the original infection.   You have an oral temperature above 102 F (38.9 C).   You develop abdominal pain.   You develop eye pain or signs of eye infection.  Document Released: 01/05/2000 Document Revised: 12/27/2010 Document Reviewed: 01/25/2009 [employer] Patient Information 2012 [employer]. 

## 2011-08-07 NOTE — Progress Notes (Addendum)
HISTORY OF PRESENT ILLNESS  [name] is a 21 y.o. year old female,G2P1011, who presents for a problem visit. The patient complains of irregular bleeding.  Her STD screening was negative except for gonorrhea and herpes 2 virus. She completed her medication less than 2 weeks ago.  Subjective:  She has had a a rash on her vulva but she did not know what it was.  Objective:  BP 98/58  Resp 14  Ht 5\' 6"  (1.676 m)  Wt 247 lb (112.038 kg)  BMI 39.87 kg/m2  LMP 07/15/2011  Breastfeeding? No   General: alert and mild distress  Exam deferred.  Pregnancy test: Negative  Assessment:  Gonorrhea Herpes 2 Irregular bleeding  Plan:  Herpes and gonorrhea discussed.  Written information given.  The patient will return in 2 weeks for a test of cure.  Safe sex was discussed. Valtrex 500 mg - patient will use as needed.  Observe bleeding only for now.  Patient has received Depo-Provera.  Return to office in 2 week(s).   Leonard Schwartz M.D.  08/07/2011 10:09 AM

## 2011-08-14 ENCOUNTER — Other Ambulatory Visit: Payer: Self-pay | Admitting: Obstetrics and Gynecology

## 2011-08-14 ENCOUNTER — Telehealth: Payer: Self-pay | Admitting: Obstetrics and Gynecology

## 2011-08-14 NOTE — Telephone Encounter (Signed)
Triage/epic 

## 2011-08-14 NOTE — Telephone Encounter (Signed)
Tc to pt regarding msg, pt says was in the office today and did get her question answered, and will check with pharmacy to be sure Rx was called in.

## 2011-08-20 ENCOUNTER — Encounter: Payer: Medicaid Other | Admitting: Obstetrics and Gynecology

## 2011-09-04 ENCOUNTER — Encounter: Payer: Self-pay | Admitting: Obstetrics and Gynecology

## 2011-09-04 ENCOUNTER — Ambulatory Visit (INDEPENDENT_AMBULATORY_CARE_PROVIDER_SITE_OTHER): Payer: Medicaid Other | Admitting: Obstetrics and Gynecology

## 2011-09-04 VITALS — BP 110/64 | Ht 67.0 in | Wt 249.0 lb

## 2011-09-04 DIAGNOSIS — Z2089 Contact with and (suspected) exposure to other communicable diseases: Secondary | ICD-10-CM

## 2011-09-04 DIAGNOSIS — Z202 Contact with and (suspected) exposure to infections with a predominantly sexual mode of transmission: Secondary | ICD-10-CM

## 2011-09-04 DIAGNOSIS — Z309 Encounter for contraceptive management, unspecified: Secondary | ICD-10-CM

## 2011-09-04 DIAGNOSIS — IMO0001 Reserved for inherently not codable concepts without codable children: Secondary | ICD-10-CM

## 2011-09-04 DIAGNOSIS — N898 Other specified noninflammatory disorders of vagina: Secondary | ICD-10-CM

## 2011-09-04 LAB — POCT WET PREP (WET MOUNT): pH: 4.5

## 2011-09-04 MED ORDER — TERCONAZOLE 0.4 % VA CREA: TOPICAL_CREAM | VAGINAL | Status: DC

## 2011-09-04 NOTE — Progress Notes (Signed)
GYN PROBLEM VISIT  [name] is a 21 y.o. year old female,G2P1011, who presents for a problem visit.   Subjective:  Test of Cure for  gonorrhea. Wants to start Depo-Provera. Also states that she believes she has yeast infection. Noticed white d/c with no odor.   Objective:  BP 110/64  Ht 5\' 7"  (1.702 m)  Wt 249 lb (112.946 kg)  BMI 39.00 kg/m2  LMP 08/12/2011     External genitalia: normal general appearance Vaginal: discharge, white and curdy Cervix: normal appearance  Assessment:  GC, for TOC culture Wet prep positive for yeast  Plan: TOC culture for GC Terazol 7 RTO for Depo Prover on first day of menses. No IC between now and the   Alisha Forth, MD  09/04/2011 3:10 PM

## 2011-09-04 NOTE — Patient Instructions (Signed)
No intercourse until after Depo Provera shot

## 2011-09-05 LAB — GC/CHLAMYDIA PROBE AMP, GENITAL
Chlamydia, DNA Probe: NEGATIVE
GC Probe Amp, Genital: NEGATIVE

## 2012-01-22 NOTE — L&D Delivery Note (Signed)
Delivery Note At 9:14 AM a viable female was delivered via Vaginal, Spontaneous Delivery (Presentation: ; Occiput Posterior).  APGAR: 8, 9; weight .   Placenta status: Intact, Spontaneous Pathology.  Cord: 3 vessels with the following complications: None.  Cord pH: not obtained  Anesthesia: Epidural  Episiotomy: None Lacerations: 1st degree;Periurethral Suture Repair: none Est. Blood Loss (mL): 300  Mom to postpartum.  Baby to Couplet care / Skin to Skin Of note. Infant had nuchal cord times one and body cord times one.  A cyst was noted on the umbilical cord close to the infant so it could not be sent to pathology  [facility] 12/08/2012, 9:55 AM

## 2012-01-30 ENCOUNTER — Telehealth: Payer: Self-pay | Admitting: Obstetrics and Gynecology

## 2012-01-30 NOTE — Telephone Encounter (Signed)
Spoke with pt rgd msg pt c/o stomach pain and vomiting pt states cycle 5 days late didn't do upt advised pt take upt if positive cal office for appt if neg call pcp for eval of stomach pain and vomiting pt voice understanding

## 2012-02-03 ENCOUNTER — Telehealth: Payer: Self-pay | Admitting: Obstetrics and Gynecology

## 2012-02-03 NOTE — Telephone Encounter (Signed)
Tc to pt per telephone call. Informed pt next available appt at end of January. Pt will cb to sched at that time after review of work schedule. Pt agrees.

## 2012-03-20 ENCOUNTER — Encounter (HOSPITAL_COMMUNITY): Payer: Self-pay | Admitting: Emergency Medicine

## 2012-03-20 ENCOUNTER — Emergency Department (HOSPITAL_COMMUNITY)
Admission: EM | Admit: 2012-03-20 | Discharge: 2012-03-21 | Disposition: A | Discharge status: Home / Self Care | Payer: Medicaid Other | Attending: Emergency Medicine | Admitting: Emergency Medicine

## 2012-03-20 DIAGNOSIS — K625 Hemorrhage of anus and rectum: Secondary | ICD-10-CM | POA: Insufficient documentation

## 2012-03-20 DIAGNOSIS — F17200 Nicotine dependence, unspecified, uncomplicated: Secondary | ICD-10-CM | POA: Insufficient documentation

## 2012-03-20 DIAGNOSIS — Z3202 Encounter for pregnancy test, result negative: Secondary | ICD-10-CM | POA: Insufficient documentation

## 2012-03-20 DIAGNOSIS — Z8619 Personal history of other infectious and parasitic diseases: Secondary | ICD-10-CM | POA: Insufficient documentation

## 2012-03-20 DIAGNOSIS — J45909 Unspecified asthma, uncomplicated: Secondary | ICD-10-CM | POA: Insufficient documentation

## 2012-03-20 LAB — URINALYSIS, ROUTINE W REFLEX MICROSCOPIC
Leukocytes, UA: NEGATIVE
Nitrite: NEGATIVE
Specific Gravity, Urine: 1.028 (ref 1.005–1.030)
pH: 6 (ref 5.0–8.0)

## 2012-03-20 LAB — POCT I-STAT, CHEM 8
Calcium, Ion: 1.21 mmol/L (ref 1.12–1.23)
Potassium: 4.3 mEq/L (ref 3.5–5.1)
Sodium: 142 mEq/L (ref 135–145)
TCO2: 26 mmol/L (ref 0–100)

## 2012-03-20 LAB — PREGNANCY, URINE: Preg Test, Ur: NEGATIVE

## 2012-03-20 NOTE — ED Notes (Signed)
Pt with history of hemorrhoids while pregnant.  Patient can feel them at times.  Patient suffering from constipation now and was on a stool softener while pregnant but is on nothing now.

## 2012-03-20 NOTE — ED Provider Notes (Signed)
History     CSN: [id number]  Arrival date & time 03/20/12  2154   First MD Initiated Contact with Patient 03/20/12 2221      Chief Complaint  Patient presents with  . Rectal Bleeding    (Consider location/radiation/quality/duration/timing/severity/associated sxs/prior treatment) HPI History provided by pt.   Pt presents w/ rectal bleeding.  Onset 10 months ago during pregnancy, thought to be secondary to hemorrhoids, though none appreciated on exam, but heavier tonight.  Blood in toilet bowl.  Associated w/ intermittent, diffuse lower abdominal cramping and chronic constipation.  Always has to strain to have a BM despite taking an OTC stool softener.  Denies fever, N/V and GU sx.  Has rectal pain but only when having a BM.  Has had lightheadedness x 2 days as well.  No FH colon or breast cancer.  Past Medical History  Diagnosis Date  . Asthma   . Gonorrhea 2011  . Chlamydia 2011    Past Surgical History  Procedure Laterality Date  . Knee surgery  2007    Family History  Problem Relation Age of Onset  . Anesthesia problems Neg Hx   . Diabetes Maternal Grandmother   . Hypertension Maternal Grandmother     History  Substance Use Topics  . Smoking status: Current Every Day Smoker -- 0.50 packs/day    Types: Cigarettes  . Smokeless tobacco: Never Used  . Alcohol Use: No    OB History   Grav Para Term Preterm Abortions TAB SAB Ect Mult Living   2 1 1  0 1 0 1 0 0 1      Review of Systems  All other systems reviewed and are negative.    Allergies  Review of patient's allergies indicates no known allergies.  Home Medications  No current outpatient prescriptions on file.  BP 112/60  Pulse 89  Temp(Src) 98.2 F (36.8 C) (Oral)  Resp 20  Ht 5\' 6"  (1.676 m)  Wt 262 lb (118.842 kg)  BMI 42.31 kg/m2  SpO2 100%  LMP 02/28/2012  Breastfeeding? No  Physical Exam  Nursing note and vitals reviewed. Constitutional: She is oriented to person, place, and time. She  appears well-developed and well-nourished. No distress.  HENT:  Head: Normocephalic and atraumatic.  Eyes:  Normal appearance  Neck: Normal range of motion.  Cardiovascular: Normal rate and regular rhythm.   Pulmonary/Chest: Effort normal and breath sounds normal. No respiratory distress.  Abdominal: Soft. Bowel sounds are normal. She exhibits no distension and no mass. There is no rebound and no guarding.  Obese. Diffuse, mild-mod tenderness across lower abdomen  Genitourinary:  No CVA tenderness.  No external hemorrhoids.  Small amt of gross, bright red blood in rectum.  Nml rectal tone and non-tender.   Musculoskeletal: Normal range of motion.  Neurological: She is alert and oriented to person, place, and time.  Skin: Skin is warm and dry. No rash noted.  Psychiatric: She has a normal mood and affect. Her behavior is normal.    ED Course  Procedures (including critical care time)  Labs Reviewed  POCT I-STAT, CHEM 8 - Abnormal; Notable for the following:    Glucose, Bld 108 (*)    All other components within normal limits  URINALYSIS, ROUTINE W REFLEX MICROSCOPIC  PREGNANCY, URINE   No results found.   1. Rectal bleeding       MDM  Irena Reichmann F presents w/ several months of hematochezia, worse tonight.  Chronic constipation and intermittent abdominal pain is stable.  Lightheadedness x 2 days.  On exam, hemodynamically stable, afebrile, diffuse lower abdominal tenderness, gross red blood on DRE but otherwise nml rectum.  U/A and chem 8 pending.  If hgb nml range and VSS, will d/c with miralax and referral to GI.  All discussed w/ patient and she understands importance of follow up as well as return precautions.  11:09 PM          Otilio Miu, PA-C 03/21/12 [phone]

## 2012-03-20 NOTE — ED Notes (Signed)
Pt states she noted blood in stool tonight @ 1900 tonight. Pt states she has had rectal bleeding on/off since birth of last child but not as much as today. Pt also c/o abd cramping. Emesis 2 days ago.

## 2012-03-21 MED ORDER — POLYETHYLENE GLYCOL 3350 17 G PO PACK: 17.0000 g | PACK | Freq: Every day | ORAL | Status: DC

## 2012-03-21 NOTE — ED Provider Notes (Signed)
Medical screening examination/treatment/procedure(s) were performed by non-physician practitioner and as supervising physician I was immediately available for consultation/collaboration.   Clara Herbison, MD 03/21/12 0551 

## 2012-04-09 ENCOUNTER — Inpatient Hospital Stay (HOSPITAL_COMMUNITY)
Admission: AD | Admit: 2012-04-09 | Discharge: 2012-04-09 | Disposition: A | Discharge status: Home / Self Care | Payer: Medicaid Other | Source: Ambulatory Visit | Attending: Obstetrics & Gynecology | Admitting: Obstetrics & Gynecology

## 2012-04-09 ENCOUNTER — Inpatient Hospital Stay (HOSPITAL_COMMUNITY): Payer: Medicaid Other

## 2012-04-09 ENCOUNTER — Encounter (HOSPITAL_COMMUNITY): Payer: Self-pay | Admitting: *Deleted

## 2012-04-09 DIAGNOSIS — O2390 Unspecified genitourinary tract infection in pregnancy, unspecified trimester: Secondary | ICD-10-CM | POA: Insufficient documentation

## 2012-04-09 DIAGNOSIS — B9689 Other specified bacterial agents as the cause of diseases classified elsewhere: Secondary | ICD-10-CM | POA: Insufficient documentation

## 2012-04-09 DIAGNOSIS — R109 Unspecified abdominal pain: Secondary | ICD-10-CM | POA: Insufficient documentation

## 2012-04-09 DIAGNOSIS — O26899 Other specified pregnancy related conditions, unspecified trimester: Secondary | ICD-10-CM

## 2012-04-09 DIAGNOSIS — N760 Acute vaginitis: Secondary | ICD-10-CM | POA: Insufficient documentation

## 2012-04-09 DIAGNOSIS — A499 Bacterial infection, unspecified: Secondary | ICD-10-CM | POA: Insufficient documentation

## 2012-04-09 LAB — CBC
HCT: 37.4 % (ref 36.0–46.0)
Hemoglobin: 12.5 g/dL (ref 12.0–15.0)
MCH: 26.9 pg (ref 26.0–34.0)
RBC: 4.64 MIL/uL (ref 3.87–5.11)

## 2012-04-09 LAB — URINALYSIS, ROUTINE W REFLEX MICROSCOPIC
Bilirubin Urine: NEGATIVE
Glucose, UA: NEGATIVE mg/dL
Ketones, ur: NEGATIVE mg/dL
Leukocytes, UA: NEGATIVE
pH: 6 (ref 5.0–8.0)

## 2012-04-09 LAB — URINE MICROSCOPIC-ADD ON

## 2012-04-09 LAB — WET PREP, GENITAL
Trich, Wet Prep: NONE SEEN
Yeast Wet Prep HPF POC: NONE SEEN

## 2012-04-09 LAB — HCG, QUANTITATIVE, PREGNANCY: hCG, Beta Chain, Quant, S: 359 m[IU]/mL — ABNORMAL HIGH (ref <5)

## 2012-04-09 MED ORDER — METRONIDAZOLE 500 MG PO TABS: 500.0000 mg | ORAL_TABLET | Freq: Two times a day (BID) | ORAL | Status: DC

## 2012-04-09 NOTE — MAU Note (Signed)
Pt reports she has been having some sharp stomach pains x 3 days, positive home preg test last week. Denies bleeding. Denies dysuria

## 2012-04-09 NOTE — MAU Provider Note (Signed)
History     CSN: [id number]  Arrival date and time: 04/09/12 0028   None     Chief Complaint  Patient presents with  . Abdominal Pain   HPI 21 y.o. G3P1011 at Unknown with sharp low abd pain x a few days, no bleeding or discharge. Patient's last menstrual period was 03/04/2012. + UPT at home.    Past Medical History  Diagnosis Date  . Asthma   . Gonorrhea 2011  . Chlamydia 2011    Past Surgical History  Procedure Laterality Date  . Knee surgery  2007    Family History  Problem Relation Age of Onset  . Anesthesia problems Neg Hx   . Diabetes Maternal Grandmother   . Hypertension Maternal Grandmother     History  Substance Use Topics  . Smoking status: Former Smoker -- 0.50 packs/day    Types: Cigarettes  . Smokeless tobacco: Never Used  . Alcohol Use: No    Allergies: No Known Allergies  Prescriptions prior to admission  Medication Sig Dispense Refill  . polyethylene glycol (MIRALAX) packet Take 17 g by mouth daily.  5 each  0    Review of Systems  Constitutional: Negative.   Respiratory: Negative.   Cardiovascular: Negative.   Gastrointestinal: Positive for abdominal pain. Negative for nausea, vomiting, diarrhea and constipation.  Genitourinary: Negative for dysuria, urgency, frequency, hematuria and flank pain.       Negative for vaginal bleeding, vaginal discharge, dyspareunia  Musculoskeletal: Negative.   Neurological: Negative.   Psychiatric/Behavioral: Negative.    Physical Exam   Blood pressure 126/55, pulse 71, temperature 99.5 F (37.5 C), temperature source Oral, resp. rate 18, height 5\' 7"  (1.702 m), weight 263 lb (119.296 kg), last menstrual period 03/04/2012, SpO2 100.00%.  Physical Exam  Nursing note and vitals reviewed. Constitutional: She is oriented to person, place, and time. She appears well-developed and well-nourished. No distress.  Cardiovascular: Normal rate.   Respiratory: Effort normal.  GI: Soft. There is no tenderness.   Genitourinary: There is no tenderness, lesion or injury on the right labia. There is no tenderness, lesion or injury on the left labia. Uterus is tender. Uterus is not enlarged. Cervix exhibits no motion tenderness, no discharge and no friability. Right adnexum displays tenderness (greater than left). Right adnexum displays no mass and no fullness. Left adnexum displays tenderness. Left adnexum displays no mass and no fullness. No bleeding around the vagina. Vaginal discharge found.  Musculoskeletal: Normal range of motion.  Neurological: She is alert and oriented to person, place, and time.  Skin: Skin is warm.  Psychiatric: She has a normal mood and affect.    MAU Course  Procedures Results for orders placed during the hospital encounter of 04/09/12 (from the past 24 hour(s))  URINALYSIS, ROUTINE W REFLEX MICROSCOPIC     Status: Abnormal   Collection Time    04/09/12 12:40 AM      Result Value Range   Color, Urine YELLOW  YELLOW   APPearance CLEAR  CLEAR   Specific Gravity, Urine >1.030 (*) 1.005 - 1.030   pH 6.0  5.0 - 8.0   Glucose, UA NEGATIVE  NEGATIVE mg/dL   Hgb urine dipstick TRACE (*) NEGATIVE   Bilirubin Urine NEGATIVE  NEGATIVE   Ketones, ur NEGATIVE  NEGATIVE mg/dL   Protein, ur NEGATIVE  NEGATIVE mg/dL   Urobilinogen, UA 0.2  0.0 - 1.0 mg/dL   Nitrite NEGATIVE  NEGATIVE   Leukocytes, UA NEGATIVE  NEGATIVE  URINE MICROSCOPIC-ADD ON  Status: Abnormal   Collection Time    04/09/12 12:40 AM      Result Value Range   Squamous Epithelial / LPF FEW (*) RARE   WBC, UA 3-6  <3 WBC/hpf   RBC / HPF 3-6  <3 RBC/hpf   Bacteria, UA FEW (*) RARE  POCT PREGNANCY, URINE     Status: Abnormal   Collection Time    04/09/12 12:49 AM      Result Value Range   Preg Test, Ur POSITIVE (*) NEGATIVE  CBC     Status: Abnormal   Collection Time    04/09/12 12:55 AM      Result Value Range   WBC 11.8 (*) 4.0 - 10.5 K/uL   RBC 4.64  3.87 - 5.11 MIL/uL   Hemoglobin 12.5  12.0 - 15.0  g/dL   HCT 16.1  09.6 - 04.5 %   MCV 80.6  78.0 - 100.0 fL   MCH 26.9  26.0 - 34.0 pg   MCHC 33.4  30.0 - 36.0 g/dL   RDW 40.9  81.1 - 91.4 %   Platelets 284  150 - 400 K/uL  HCG, QUANTITATIVE, PREGNANCY     Status: Abnormal   Collection Time    04/09/12 12:55 AM      Result Value Range   hCG, Beta Chain, Quant, S 359 (*) <5 mIU/mL  WET PREP, GENITAL     Status: Abnormal   Collection Time    04/09/12  1:35 AM      Result Value Range   Yeast Wet Prep HPF POC NONE SEEN  NONE SEEN   Trich, Wet Prep NONE SEEN  NONE SEEN   Clue Cells Wet Prep HPF POC FEW (*) NONE SEEN   WBC, Wet Prep HPF POC FEW (*) NONE SEEN   US Ob Comp Less 14 Wks  04/09/2012  *RADIOLOGY REPORT*  Clinical Data: Unsure of dates.  Pelvic pain.  Beta HCG 359  OBSTETRIC <14 WK [place] AND TRANSVAGINAL OB US  Technique:  Both transabdominal and transvaginal ultrasound examinations were performed for complete evaluation of the gestation as well as the maternal uterus, adnexal regions, and pelvic cul-de-sac.  Transvaginal technique was performed to assess early pregnancy.  Comparison:  None.  Intrauterine gestational sac:  No intrauterine gestational sac is identified.  Endometrial stripe thickness is prominent, measuring 1.5 cm.  No endometrial fluid collections. Yolk sac: No yolk sac identified. Embryo: No fetal pole identified. Cardiac Activity: No fetal cardiac activity identified.  Maternal uterus/adnexae: The uterus is anteverted without myometrial mass lesion.  The right ovary measures 3.5 x 1.9 x 2.2 cm.  Normal follicular changes. Color flow demonstrated.  The left ovary measures 4.1 x 2.9 x 2.7 cm.  Corpus luteum demonstrated.  Color flow demonstrated.  No free pelvic fluid collections.  IMPRESSION: No intrauterine gestational sac is visualized.  Presumed corpus luteal cyst in the left ovary.  Changes could be due to early intrauterine pregnancy which is too small ascitic, recent missed spontaneous abortion, or occult ectopic  pregnancy.  Recommend follow-up with quantitative beta HCG and / or short-term ultrasound in 10-14 days as clinically indicated.   Original Report Authenticated By: Burman Nieves, M.D.     Assessment and Plan   1. BV (bacterial vaginosis)   2. Abdominal pain in pregnancy, antepartum   Precautions rev'd, f/u in 48 hours for repeat quant    Medication List    TAKE these medications       metroNIDAZOLE  500 MG tablet  Commonly known as:  FLAGYL  Take 1 tablet (500 mg total) by mouth 2 (two) times daily.     polyethylene glycol packet  Commonly known as:  MIRALAX  Take 17 g by mouth daily.            Follow-up Information   Follow up with [facility] On 04/11/2012. (for repeat labs)    Contact information:   [address] [id number] [place] [address] [phone]        [facility] 04/09/2012, 1:26 AM

## 2012-04-10 LAB — URINE CULTURE
Colony Count: NO GROWTH
Culture: NO GROWTH

## 2012-04-11 ENCOUNTER — Inpatient Hospital Stay (HOSPITAL_COMMUNITY)
Admission: AD | Admit: 2012-04-11 | Discharge: 2012-04-11 | Disposition: A | Discharge status: Home / Self Care | Payer: Medicaid Other | Source: Ambulatory Visit | Attending: Obstetrics and Gynecology | Admitting: Obstetrics and Gynecology

## 2012-04-11 DIAGNOSIS — R109 Unspecified abdominal pain: Secondary | ICD-10-CM | POA: Insufficient documentation

## 2012-04-11 DIAGNOSIS — O99891 Other specified diseases and conditions complicating pregnancy: Secondary | ICD-10-CM | POA: Insufficient documentation

## 2012-04-11 NOTE — MAU Note (Signed)
Ms. Rae is here today for a follow up Hcg. She denies bleeding or pain today.

## 2012-04-11 NOTE — MAU Provider Note (Signed)
History    22 yo G3P1011 presents for f/u [facility] from MAU visit on 04/09/12--had +UPT at home this week, with onset of sharp lower abdominal pain for a few days.  Denied bleeding or d/c.  Was seen by Georges Mouse, CNM.  Today, feeling well, with no pain or bleeding.  QHCG 359  [place]: I MPRESSION: No intrauterine gestational sac is visualized. Presumed corpus luteal cyst in the left ovary. Changes could be due to early intrauterine pregnancy which is too small ascitic, recent missed spontaneous abortion, or occult ectopic pregnancy. Recommend follow-up with quantitative beta HCG and / or short-term ultrasound in 10-14 days as clinically indicated. Original Report Authenticated By: Burman Nieves, M.D.  GC, chlamydia done via cervical culture--GC negative, chlamydia indeterminate. + BV--Rx'd with Metronidazole.  Instructed to return to MAU today for repeat [facility].  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 Last visit at [facility] 8/13--TOC for [facility]    Chief Complaint  Patient presents with  . Labs Only     OB History   Grav Para Term Preterm Abortions TAB SAB Ect Mult Living   3 1 1  0 1 0 1 0 0 1      Past Medical History  Diagnosis Date  . Asthma   . Gonorrhea 2011  . Chlamydia 2011    Past Surgical History  Procedure Laterality Date  . Knee surgery  2007    Family History  Problem Relation Age of Onset  . Anesthesia problems Neg Hx   . Diabetes Maternal Grandmother   . Hypertension Maternal Grandmother     History  Substance Use Topics  . Smoking status: Former Smoker -- 0.50 packs/day    Types: Cigarettes  . Smokeless tobacco: Never Used  . Alcohol Use: No    Allergies: No Known Allergies  Prescriptions prior to admission  Medication Sig Dispense Refill  . metroNIDAZOLE (FLAGYL) 500 MG tablet Take 1 tablet (500 mg total) by  mouth 2 (two) times daily.  14 tablet  0  . polyethylene glycol (MIRALAX) packet Take 17 g by mouth daily.  5 each  0     Physical Exam   Blood pressure 123/63, pulse 64, temperature 97.9 F (36.6 C), temperature source Oral, resp. rate 18, last menstrual period 03/04/2012.  PE deferred  ED Course  Early pregnancy Here for f/u [facility] Urine for GC/chlamydia  Will f/u with patient by phone regarding results Anticipate planning [place] as appropriate based on QHCGs. Await confirmatory testing for chlamydia.   Nigel Bridgeman CNM, [place] 04/11/2012 11:46 AM  Addendum: QHCG result = 1351.  TC to patient to review--will have office call patient on Monday to schedule [place] for viability/dating in the next week. Precautions reviewed with the patient. Will f/u with her when chlamydia resulted.  Nigel Bridgeman, CNM 04/11/12 12:30p

## 2012-04-11 NOTE — MAU Note (Signed)
[name] called pt with results. Pt will follow up in the office next week.

## 2012-04-15 ENCOUNTER — Encounter (HOSPITAL_COMMUNITY): Payer: Self-pay | Admitting: *Deleted

## 2012-04-15 ENCOUNTER — Inpatient Hospital Stay (HOSPITAL_COMMUNITY)
Admission: AD | Admit: 2012-04-15 | Discharge: 2012-04-15 | Disposition: A | Discharge status: Home / Self Care | Payer: Medicaid Other | Source: Ambulatory Visit | Attending: Obstetrics and Gynecology | Admitting: Obstetrics and Gynecology

## 2012-04-15 DIAGNOSIS — R197 Diarrhea, unspecified: Secondary | ICD-10-CM

## 2012-04-15 DIAGNOSIS — O210 Mild hyperemesis gravidarum: Secondary | ICD-10-CM | POA: Insufficient documentation

## 2012-04-15 DIAGNOSIS — O219 Vomiting of pregnancy, unspecified: Secondary | ICD-10-CM

## 2012-04-15 LAB — CBC
Platelets: 265 10*3/uL (ref 150–400)
RDW: 14.8 % (ref 11.5–15.5)
WBC: 7.8 10*3/uL (ref 4.0–10.5)

## 2012-04-15 LAB — URINALYSIS, ROUTINE W REFLEX MICROSCOPIC
Bilirubin Urine: NEGATIVE
Hgb urine dipstick: NEGATIVE
Specific Gravity, Urine: 1.015 (ref 1.005–1.030)
pH: 6.5 (ref 5.0–8.0)

## 2012-04-15 LAB — COMPREHENSIVE METABOLIC PANEL
AST: 14 U/L (ref 0–37)
Albumin: 3.4 g/dL — ABNORMAL LOW (ref 3.5–5.2)
Alkaline Phosphatase: 89 U/L (ref 39–117)
Chloride: 102 mEq/L (ref 96–112)
Creatinine, Ser: 0.74 mg/dL (ref 0.50–1.10)
Potassium: 4.1 mEq/L (ref 3.5–5.1)
Sodium: 135 mEq/L (ref 135–145)
Total Bilirubin: 0.2 mg/dL — ABNORMAL LOW (ref 0.3–1.2)

## 2012-04-15 LAB — URINE MICROSCOPIC-ADD ON

## 2012-04-15 LAB — HCG, QUANTITATIVE, PREGNANCY: hCG, Beta Chain, Quant, S: 4986 m[IU]/mL — ABNORMAL HIGH (ref <5)

## 2012-04-15 MED ORDER — ONDANSETRON HCL 4 MG PO TABS
4.0000 mg | ORAL_TABLET | Freq: Once | ORAL | Status: AC
  Administered 2012-04-15: 4 mg via ORAL
  Filled 2012-04-15: qty 1

## 2012-04-15 NOTE — MAU Provider Note (Signed)
History  22yo G2P1 at [redacted]w[redacted]d by LMP presents unannounced with c/o N/V/D.  Symptoms started yesterday morning however the diarrhea stopped before her office visit with [facility] and so the pt did not mention this to [facility].  LC rx'd Zofran for N/V though pt has not picked it up from pharmacy yet.  Symptoms returned this morning around 2am.  Pt reports constant vomitting, "unable to leave the bathroom."  Stool is formed but soft; experiencing cramping and urgency.  Pt reports her 57 month old is "coming down with something" and has started vomiting.    CSN: [id number]  Arrival date and time: 04/15/12 0940   None     Chief Complaint  Patient presents with  . Nausea  . Emesis  . Diarrhea   HPI  LMP - 03/04/12 04/09/12 - Quant = 359                 U/S - no gestational sac visualized 04/11/12 - Quant = 1351 04/14/12 - Office visit                 U/S - Anteverted uterus, [redacted]w[redacted]d gestational sac seen, yolk sac seen   Past Medical History  Diagnosis Date  . Asthma   . Gonorrhea 2011  . Chlamydia 2011    Past Surgical History  Procedure Laterality Date  . Knee surgery  2007    Family History  Problem Relation Age of Onset  . Anesthesia problems Neg Hx   . Diabetes Maternal Grandmother   . Hypertension Maternal Grandmother     History  Substance Use Topics  . Smoking status: Former Smoker -- 0.50 packs/day    Types: Cigarettes  . Smokeless tobacco: Never Used  . Alcohol Use: No    Allergies: No Known Allergies  Prescriptions prior to admission  Medication Sig Dispense Refill  . metroNIDAZOLE (FLAGYL) 500 MG tablet Take 1 tablet (500 mg total) by mouth 2 (two) times daily.  14 tablet  0  . Prenatal Vit-Fe Fumarate-FA (PRENATAL MULTIVITAMIN) TABS Take 1 tablet by mouth daily at 12 noon.        Review of Systems  Constitutional: Negative for fever and chills.  Respiratory: Negative for cough.   Gastrointestinal: Positive for nausea, vomiting and diarrhea.  Genitourinary: Negative  for dysuria, urgency and frequency.       No VB or uterine cramping  Skin: Negative for rash.  Neurological: Negative for headaches.   Physical Exam   Blood pressure 121/78, pulse 69, temperature 99.1 F (37.3 C), temperature source Oral, resp. rate 16, height 5' 6.5" (1.689 m), weight 258 lb 12.8 oz (117.391 kg), last menstrual period 03/04/2012.  Results for orders placed during the hospital encounter of 04/15/12 (from the past 24 hour(s))  URINALYSIS, ROUTINE W REFLEX MICROSCOPIC     Status: Abnormal   Collection Time    04/15/12  9:45 AM      Result Value Range   Color, Urine YELLOW  YELLOW   APPearance CLEAR  CLEAR   Specific Gravity, Urine 1.015  1.005 - 1.030   pH 6.5  5.0 - 8.0   Glucose, UA NEGATIVE  NEGATIVE mg/dL   Hgb urine dipstick NEGATIVE  NEGATIVE   Bilirubin Urine NEGATIVE  NEGATIVE   Ketones, ur NEGATIVE  NEGATIVE mg/dL   Protein, ur NEGATIVE  NEGATIVE mg/dL   Urobilinogen, UA 0.2  0.0 - 1.0 mg/dL   Nitrite NEGATIVE  NEGATIVE   Leukocytes, UA TRACE (*) NEGATIVE  URINE MICROSCOPIC-ADD  ON     Status: Abnormal   Collection Time    04/15/12  9:45 AM      Result Value Range   Squamous Epithelial / LPF MANY (*) RARE   WBC, UA 0-2  <3 WBC/hpf   RBC / HPF 0-2  <3 RBC/hpf   Bacteria, UA RARE  RARE  HCG, QUANTITATIVE, PREGNANCY     Status: Abnormal   Collection Time    04/15/12 10:35 AM      Result Value Range   hCG, Beta Chain, Quant, S 4986 (*) <5 mIU/mL  CBC     Status: None   Collection Time    04/15/12 10:35 AM      Result Value Range   WBC 7.8  4.0 - 10.5 K/uL   RBC 4.60  3.87 - 5.11 MIL/uL   Hemoglobin 12.3  12.0 - 15.0 g/dL   HCT 16.1  09.6 - 04.5 %   MCV 79.6  78.0 - 100.0 fL   MCH 26.7  26.0 - 34.0 pg   MCHC 33.6  30.0 - 36.0 g/dL   RDW 40.9  81.1 - 91.4 %   Platelets 265  150 - 400 K/uL  COMPREHENSIVE METABOLIC PANEL     Status: Abnormal   Collection Time    04/15/12 10:35 AM      Result Value Range   Sodium 135  135 - 145 mEq/L   Potassium  4.1  3.5 - 5.1 mEq/L   Chloride 102  96 - 112 mEq/L   CO2 25  19 - 32 mEq/L   Glucose, Bld 91  70 - 99 mg/dL   BUN 9  6 - 23 mg/dL   Creatinine, Ser 7.82  0.50 - 1.10 mg/dL   Calcium 8.6  8.4 - 95.6 mg/dL   Total Protein 6.9  6.0 - 8.3 g/dL   Albumin 3.4 (*) 3.5 - 5.2 g/dL   AST 14  0 - 37 U/L   ALT 10  0 - 35 U/L   Alkaline Phosphatase 89  39 - 117 U/L   Total Bilirubin 0.2 (*) 0.3 - 1.2 mg/dL   GFR calc non Af Amer >90  >90 mL/min   GFR calc Af Amer >90  >90 mL/min     Physical Exam  Constitutional: No distress.  Cardiovascular: Normal rate.   Respiratory: Effort normal.  GI: Soft.    MAU Course  Procedures  UA Quant, CBC, CMET   Assessment and Plan  Possible IUP at [redacted]w[redacted]d N/V/D  C/w Dr. Normand Sloop Zofran 4mg  Once - tolerated POs well  Keep follow up U/S in 3 weeks for viability Pick up Diclegis at the office    Haroldine Laws CNM, MSN 04/15/2012, 1:31 PM

## 2012-04-15 NOTE — MAU Note (Signed)
C/o N$V and diarrhea since this AM around 0200;

## 2012-04-15 NOTE — MAU Note (Signed)
Patient states she started having nausea, vomiting and diarrhea last night that continues today. Multiple episodes.

## 2012-05-05 LAB — OB RESULTS CONSOLE HIV ANTIBODY (ROUTINE TESTING): HIV: NONREACTIVE

## 2012-05-13 ENCOUNTER — Encounter: Payer: Self-pay | Admitting: Obstetrics and Gynecology

## 2012-08-21 ENCOUNTER — Inpatient Hospital Stay (HOSPITAL_COMMUNITY)
Admission: AD | Admit: 2012-08-21 | Discharge: 2012-08-21 | Disposition: A | Discharge status: Home / Self Care | Payer: Medicaid Other | Source: Ambulatory Visit | Attending: Obstetrics and Gynecology | Admitting: Obstetrics and Gynecology

## 2012-08-21 ENCOUNTER — Encounter (HOSPITAL_COMMUNITY): Payer: Self-pay | Admitting: *Deleted

## 2012-08-21 DIAGNOSIS — O2390 Unspecified genitourinary tract infection in pregnancy, unspecified trimester: Secondary | ICD-10-CM | POA: Insufficient documentation

## 2012-08-21 DIAGNOSIS — R109 Unspecified abdominal pain: Secondary | ICD-10-CM | POA: Insufficient documentation

## 2012-08-21 DIAGNOSIS — B3731 Acute candidiasis of vulva and vagina: Secondary | ICD-10-CM | POA: Insufficient documentation

## 2012-08-21 DIAGNOSIS — O26859 Spotting complicating pregnancy, unspecified trimester: Secondary | ICD-10-CM | POA: Insufficient documentation

## 2012-08-21 DIAGNOSIS — K5900 Constipation, unspecified: Secondary | ICD-10-CM | POA: Insufficient documentation

## 2012-08-21 DIAGNOSIS — B373 Candidiasis of vulva and vagina: Secondary | ICD-10-CM | POA: Insufficient documentation

## 2012-08-21 DIAGNOSIS — O26892 Other specified pregnancy related conditions, second trimester: Secondary | ICD-10-CM

## 2012-08-21 LAB — WET PREP, GENITAL: Clue Cells Wet Prep HPF POC: NONE SEEN

## 2012-08-21 LAB — URINE MICROSCOPIC-ADD ON

## 2012-08-21 LAB — URINALYSIS, ROUTINE W REFLEX MICROSCOPIC
Nitrite: NEGATIVE
Specific Gravity, Urine: 1.025 (ref 1.005–1.030)
Urobilinogen, UA: 2 mg/dL — ABNORMAL HIGH (ref 0.0–1.0)
pH: 6.5 (ref 5.0–8.0)

## 2012-08-21 MED ORDER — TERCONAZOLE 0.4 % VA CREA: 1.0000 | TOPICAL_CREAM | Freq: Every day | VAGINAL | Status: AC

## 2012-08-21 NOTE — MAU Note (Signed)
Pt reports pressure, which as increased since arriving to MAU. Pt states that although she has had no bleeding today, she has had some spotting for the 3 previous days.

## 2012-08-21 NOTE — MAU Provider Note (Signed)
History   [name] is a 22y.o. SBF who presents unannounced w/ CC of lower abdominal pressure for the last few days and pink spotting intermittently w/ wiping last 2-3 days (none today).  Pt has tried nothing for pressure.  Endorses inadequate po hydration.  No n/v/d, but reports long standing h/o infrequent BM's.  No resp c/o's.  No fever or chills. No UTI or PIH s/s.  Normal FM.  Pt is unemployed and not in school.  She lives w/ her mom, younger siblings, and 1y.o.  She desires to "do hair" in the future after this delivery. Pt accompanied by her mother and she followed me out of room after initial intake and disclosed recent stressors amongst pt & FOB.  Pt has a 21 y.o. at home and FOB wanted to take her to [facility] w/ him, and the pt and him have been arguing about it and ultimately she decided she was not going to allow the 21y.o. to accompany him on trip.  There has been no physical contact between the two.  Mother thinks pt may be depressed secondary to hope for reconciliation and closely-spaced pregnancies.  Mother states pt "doesn't eat," as she should during pregnancy.     CSN: [id number]  Arrival date and time: 08/21/12 7829   First Provider Initiated Contact with Patient 08/21/12 1955      Chief Complaint  Patient presents with  . Abdominal Pain   HPI  OB History   Grav Para Term Preterm Abortions TAB SAB Ect Mult Living   3 1 1  0 1 0 1 0 0 1      Past Medical History  Diagnosis Date  . Asthma   . Gonorrhea 2011  . Chlamydia 2011    Past Surgical History  Procedure Laterality Date  . Knee surgery  2007    Family History  Problem Relation Age of Onset  . Anesthesia problems Neg Hx   . Diabetes Maternal Grandmother   . Hypertension Maternal Grandmother     History  Substance Use Topics  . Smoking status: Former Smoker -- 0.50 packs/day    Types: Cigarettes    Quit date: 03/18/2012  . Smokeless tobacco: Never Used  . Alcohol Use: No    Allergies: No  Known Allergies  No prescriptions prior to admission    ROS--see HPI Physical Exam   Blood pressure 110/62, pulse 83, temperature 98.7 F (37.1 C), temperature source Oral, resp. rate 16, height 5\' 6"  (1.676 m), weight 256 lb 9.6 oz (116.393 kg), last menstrual period 03/04/2012, SpO2 100.00%.  Physical Exam  Constitutional: She is oriented to person, place, and time. She appears well-developed and well-nourished. No distress.  HENT:  Head: Normocephalic and atraumatic.  Eyes: Pupils are equal, round, and reactive to light.  Cardiovascular: Normal rate.   Respiratory: Effort normal.  GI: Soft. There is no guarding.  gravid  Genitourinary:  SSE: white, homongenous, nonodorous d/c coating labia minora, introitus and in vault Cx: closed, long  Neurological: She is alert and oriented to person, place, and time.  Skin: Skin is warm and dry.  Scar on Lt inner/lateral knee   Psychiatric: She has a normal mood and affect. Her behavior is normal. Judgment and thought content normal.    MAU Course  Procedures 1. NST: 150, mod variability, appropriate for GA; TOCO: no apparent ctxs 2. SSE for gc/ct, wet prep--yeast and few WBC; cx's both neg 3. U/a--hazy, trace Hgb, sm leuks, many sq epith cells  Assessment  and Plan  1. [redacted]w[redacted]d 2. No s/s of PTL 3. Yeast vaginitis 4. Spotting in 2nd trimester 5. Rh Pos 6. Stress w/ FOB 7. FHT appropriate for GA 8. Constipation   1. D/c'd home w/ PTL precautions and f/u this week as already scheduled at [facility], or prn 2. Support given r/e FOB issues and rec'd pelvic rest until 7 consecutive days w/o any bleeding. Pt w/o SI or HI and will refer to mental health provider prn if worsening s/s; good support network w/ her mom and her grandmother 3. Rx given for Terazol 7 pv qhs x7 nights; reiterated small frequent meals and adequate water intake; offered pt motrin in MAU, but declined; may use prn at home until 30 weeks; daily probiotic enc'd and disc'd  adequate F&V/fiber intake  Marjie Chea H 08/21/2012, 8:18 PM

## 2012-08-21 NOTE — MAU Note (Signed)
Patient states she has been having lower abdominal/pelvic pain for a couple of days worse with walling. States had a little spotting yesterday and the day before but not today. Reports good fetal movement.

## 2012-08-22 LAB — GC/CHLAMYDIA PROBE AMP: GC Probe RNA: NEGATIVE

## 2012-10-18 ENCOUNTER — Inpatient Hospital Stay (HOSPITAL_COMMUNITY)
Admission: AD | Admit: 2012-10-18 | Discharge: 2012-10-18 | Disposition: A | Discharge status: Home / Self Care | Payer: Medicaid Other | Source: Ambulatory Visit | Attending: Obstetrics and Gynecology | Admitting: Obstetrics and Gynecology

## 2012-10-18 ENCOUNTER — Encounter (HOSPITAL_COMMUNITY): Payer: Self-pay | Admitting: *Deleted

## 2012-10-18 DIAGNOSIS — K5900 Constipation, unspecified: Secondary | ICD-10-CM | POA: Insufficient documentation

## 2012-10-18 DIAGNOSIS — N949 Unspecified condition associated with female genital organs and menstrual cycle: Secondary | ICD-10-CM | POA: Insufficient documentation

## 2012-10-18 DIAGNOSIS — O99891 Other specified diseases and conditions complicating pregnancy: Secondary | ICD-10-CM | POA: Insufficient documentation

## 2012-10-18 LAB — WET PREP, GENITAL: Clue Cells Wet Prep HPF POC: NONE SEEN

## 2012-10-18 LAB — URINALYSIS, ROUTINE W REFLEX MICROSCOPIC
Glucose, UA: NEGATIVE mg/dL
Ketones, ur: NEGATIVE mg/dL
Nitrite: NEGATIVE
Protein, ur: NEGATIVE mg/dL
pH: 6 (ref 5.0–8.0)

## 2012-10-18 LAB — URINE MICROSCOPIC-ADD ON

## 2012-10-18 LAB — OB RESULTS CONSOLE GC/CHLAMYDIA: Chlamydia: NEGATIVE

## 2012-10-18 NOTE — MAU Note (Signed)
Patient presents with suprapubic pain since yesterday.

## 2012-10-18 NOTE — Consult Note (Signed)
DATE: 10/18/2012  Maternity Admissions Unit History and Physical Exam for an Obstetrics Patient  [name] is a 22 y.o. female, G3P1011, at [redacted]w[redacted]d gestation, who presents for evaluation of vaginal pain. The patient denies bleeding and leakage of fluid. She complains of constipation although she did have a bowel movement yesterday. She denies dysuria and hematuria. Her baby is active.. She has been followed at the [facility] Gy[facility]cology division of [facility].  See history below.  OB History   Grav Para Term Preterm Abortions TAB SAB Ect Mult Living   3 1 1  0 1 0 1 0 0 1      Past Medical History  Diagnosis Date  . Asthma   . Gonorrhea 2011  . Chlamydia 2011    No prescriptions prior to admission    Past Surgical History  Procedure Laterality Date  . Knee surgery  2007    No Known Allergies  Family History: family history includes Diabetes in her maternal grandmother; Hypertension in her maternal grandmother. There is no history of Anesthesia problems.  Social History:  reports that she quit smoking about 7 months ago. Her smoking use included Cigarettes. She smoked 0.50 packs per day. She has never used smokeless tobacco. She reports that she does not drink alcohol or use illicit drugs.  Review of systems: Normal pregnancy complaints.  Admission Physical Exam:    Body mass index is 42.95 kg/(m^2).  Blood pressure 118/70, pulse 83, temperature 97.9 F (36.6 C), temperature source Oral, resp. rate 18, height 5\' 6"  (1.676 m), weight 266 lb (120.657 kg), last menstrual period 03/04/2012, currently breastfeeding.  HEENT:                 Within normal limits Chest:                   Clear Heart:                    Regular rate and rhythm Abdomen:             Gravid and nontender Extremities:          Grossly normal Neurologic exam: Grossly normal Pelvic exam:         Cervix: Closed and long. No blood or fluid in the vaginal  vault.  NST: Category 1; Contractions: Few .   Results for orders placed during the hospital encounter of 10/18/12 (from the past 24 hour(s))  URINALYSIS, ROUTINE W REFLEX MICROSCOPIC     Status: Abnormal   Collection Time    10/18/12  4:30 PM      Result Value Range   Color, Urine YELLOW  YELLOW   APPearance CLEAR  CLEAR   Specific Gravity, Urine 1.010  1.005 - 1.030   pH 6.0  5.0 - 8.0   Glucose, UA NEGATIVE  NEGATIVE mg/dL   Hgb urine dipstick NEGATIVE  NEGATIVE   Bilirubin Urine NEGATIVE  NEGATIVE   Ketones, ur NEGATIVE  NEGATIVE mg/dL   Protein, ur NEGATIVE  NEGATIVE mg/dL   Urobilinogen, UA 0.2  0.0 - 1.0 mg/dL   Nitrite NEGATIVE  NEGATIVE   Leukocytes, UA TRACE (*) NEGATIVE  URINE MICROSCOPIC-ADD ON     Status: Abnormal   Collection Time    10/18/12  4:30 PM      Result Value Range   Squamous Epithelial / LPF FEW (*) RARE   WBC, UA 0-2  <3 WBC/hpf   RBC /  HPF 0-2  <3 RBC/hpf   Bacteria, UA FEW (*) RARE    Assessment:  [redacted]w[redacted]d gestation  Vaginal pain  Plan:  Gonorrhea, Chlamydia, and wet prep sent.  Use Tylenol as needed.  High fiber diet recommended.  Return to office in one week or as needed.   Janine Limbo 10/18/2012, 5:38 PM

## 2012-10-18 NOTE — MAU Note (Signed)
Pt presents with complaints of vaginal pain and pressure that started yesterday. Denies any bleeding or leakage of fluid

## 2012-10-18 NOTE — MAU Note (Signed)
Patient off EFM for discharge home; FHR=145.

## 2012-10-18 NOTE — Progress Notes (Signed)
Specimens for wet prep and gc/chlamydia obtained. Cervix closed.

## 2012-10-19 LAB — GC/CHLAMYDIA PROBE AMP: GC Probe RNA: NEGATIVE

## 2012-11-12 ENCOUNTER — Inpatient Hospital Stay (HOSPITAL_COMMUNITY)
Admission: AD | Admit: 2012-11-12 | Discharge: 2012-11-12 | Disposition: A | Discharge status: Home / Self Care | Payer: Medicaid Other | Source: Ambulatory Visit | Attending: Obstetrics and Gynecology | Admitting: Obstetrics and Gynecology

## 2012-11-12 ENCOUNTER — Encounter (HOSPITAL_COMMUNITY): Payer: Self-pay | Admitting: *Deleted

## 2012-11-12 DIAGNOSIS — O99891 Other specified diseases and conditions complicating pregnancy: Secondary | ICD-10-CM | POA: Insufficient documentation

## 2012-11-12 DIAGNOSIS — R51 Headache: Secondary | ICD-10-CM | POA: Insufficient documentation

## 2012-11-12 LAB — URINALYSIS, ROUTINE W REFLEX MICROSCOPIC
Ketones, ur: 15 mg/dL — AB
Nitrite: NEGATIVE
Protein, ur: NEGATIVE mg/dL
Urobilinogen, UA: 1 mg/dL (ref 0.0–1.0)

## 2012-11-12 LAB — URINE MICROSCOPIC-ADD ON

## 2012-11-12 MED ORDER — HYDROCODONE-ACETAMINOPHEN 5-325 MG PO TABS
1.0000 | ORAL_TABLET | Freq: Once | ORAL | Status: AC
  Administered 2012-11-12: 1 via ORAL
  Filled 2012-11-12: qty 1

## 2012-11-12 MED ORDER — LACTATED RINGERS IV SOLN: INTRAVENOUS | Status: DC

## 2012-11-12 MED ORDER — DIPHENHYDRAMINE HCL 50 MG/ML IJ SOLN
25.0000 mg | Freq: Once | INTRAMUSCULAR | Status: AC
  Administered 2012-11-12: 25 mg via INTRAVENOUS
  Filled 2012-11-12: qty 1

## 2012-11-12 MED ORDER — METOCLOPRAMIDE HCL 5 MG/ML IJ SOLN
10.0000 mg | Freq: Once | INTRAMUSCULAR | Status: AC
  Administered 2012-11-12: 10 mg via INTRAVENOUS
  Filled 2012-11-12: qty 2

## 2012-11-12 MED ORDER — LACTATED RINGERS IV BOLUS (SEPSIS)
500.0000 mL | Freq: Once | INTRAVENOUS | Status: AC
  Administered 2012-11-12: 500 mL via INTRAVENOUS

## 2012-11-12 NOTE — MAU Note (Signed)
Patient presents with complaint of dehydration and headache x 3 days.

## 2012-11-12 NOTE — MAU Provider Note (Signed)
History   22 yo G3P1011 at 50 1/7 weeks presented after early office visit for IV hydration and treatment of persistent HA--patient has struggled with HAs during pregnancy (no previous hx).  Had PIH w/u 10/20 due to HA, with normotensive values.  Neurology referral initiated today by Elane Fritz, NP, and patient Rx'd with Vicodin for HA.  Patient denies visual sx, has occasional nausea with HAs, reports +FM.  Denies contractions, fever, bleeding, leaking, or any other issues.  Patient Active Problem List   Diagnosis Date Noted  . 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 Contact with or exposure to venereal diseases 07/17/2011  . Morbid obesity 03/25/2011  No HSV lesions or prodrome per patient report.   Chief Complaint  Patient presents with  . Dehydration  . Headache   HPI :  See above   OB History   Grav Para Term Preterm Abortions TAB SAB Ect Mult Living   3 1 1  0 1 0 1 0 0 1      Past Medical History  Diagnosis Date  . Asthma   . Gonorrhea 2011  . Chlamydia 2011    Past Surgical History  Procedure Laterality Date  . Knee surgery  2007    Family History  Problem Relation Age of Onset  . Anesthesia problems Neg Hx   . Diabetes Maternal Grandmother   . Hypertension Maternal Grandmother     History  Substance Use Topics  . Smoking status: Former Smoker -- 0.50 packs/day for 5 years    Types: Cigarettes    Quit date: 03/18/2012  . Smokeless tobacco: Never Used  . Alcohol Use: No    Allergies: No Known Allergies  No prescriptions prior to admission    ROS:  Temporal HA Physical Exam   Blood pressure 122/57, pulse 88, resp. rate 18, height 5\' 6"  (1.676 m), weight 262 lb (118.842 kg), last menstrual period 03/04/2012.  Physical Exam In NAD Chest clear Heart RRR without murmur Abd gravid, NT Pelvic--deferred Ext WNL  FHR Category 1 UCs occasional, mild  ED Course  IUP at 36 1/7 weeks Chronic HAs No evidence HTN  Plan: IV hydration with  1 bag IVF. Benadryl 25 mg IV x 1 Reglan 10 mg IV x 1 Vicodin 1 tablet now. Anticipate d/c home after meds, with plan for f/u with neurologist.  Nigel Bridgeman CNM, MN 11/12/2012 8:00 PM  Addendum: Feeling much better after IV hydration, Benadryl, Reglan, and Vicodin. FHR Category 1 Occasional, mild UCs.  D/C home with comfort measures for HA. F/u as scheduled with [facility] and pending Neuro referral. Rx Vicodin from the office.  Nigel Bridgeman, CNM 11/12/12 10:15p

## 2012-11-15 ENCOUNTER — Inpatient Hospital Stay (HOSPITAL_COMMUNITY)
Admission: AD | Admit: 2012-11-15 | Discharge: 2012-11-15 | Disposition: A | Discharge status: Home / Self Care | Payer: Medicaid Other | Source: Ambulatory Visit | Attending: Obstetrics and Gynecology | Admitting: Obstetrics and Gynecology

## 2012-11-15 ENCOUNTER — Encounter (HOSPITAL_COMMUNITY): Payer: Self-pay

## 2012-11-15 DIAGNOSIS — O99891 Other specified diseases and conditions complicating pregnancy: Secondary | ICD-10-CM | POA: Insufficient documentation

## 2012-11-15 DIAGNOSIS — R51 Headache: Secondary | ICD-10-CM | POA: Insufficient documentation

## 2012-11-15 LAB — URINE MICROSCOPIC-ADD ON

## 2012-11-15 LAB — URINALYSIS, ROUTINE W REFLEX MICROSCOPIC
Bilirubin Urine: NEGATIVE
Glucose, UA: NEGATIVE mg/dL
Hgb urine dipstick: NEGATIVE
Ketones, ur: NEGATIVE mg/dL
Nitrite: NEGATIVE
Specific Gravity, Urine: 1.025 (ref 1.005–1.030)
pH: 8 (ref 5.0–8.0)

## 2012-11-15 MED ORDER — BUTALBITAL-APAP-CAFFEINE 50-325-40 MG PO TABS: 1.0000 | ORAL_TABLET | ORAL | Status: DC | PRN

## 2012-11-15 MED ORDER — BUTALBITAL-APAP-CAFFEINE 50-325-40 MG PO TABS
2.0000 | ORAL_TABLET | Freq: Once | ORAL | Status: AC
  Administered 2012-11-15: 2 via ORAL
  Filled 2012-11-15: qty 2

## 2012-11-15 NOTE — MAU Note (Signed)
Pt states awoke today around 1400 crying with severe headache. Has had problems with headaches with this pregnancy. Denies abnormal vaginal discharge or bleeding. Was having ctx's last pm.

## 2012-11-15 NOTE — MAU Note (Signed)
C/o headache today that she rates @ 6;  Asking if she can eat; took a Hydrocodone @ 0900 this AM; tooka nap around 1200 and woke-up @ 1400 crying with a headache; pt's mother states that she thinks she dehydrated;

## 2012-11-15 NOTE — MAU Provider Note (Signed)
  History     CSN: [id number]  Arrival date and time: 11/15/12 1447   None     Chief Complaint  Patient presents with  . Headache   HPI Comments: G3P1011 at [redacted]w[redacted]d arrives to MAU unannounced w CC of HA, has struggled w HA's during this pregnancy, was seen in MAU on 10/23 and given IVF's, benadryl and reglan, w vicodin and pt states it made her sleepy and HA resolved. SHe had HA today when she woke up at 9am and took vicodin and went back to sleep, and when she woke up at 2pm, HA was worse. Hasn't tried anything else.  States light seems to bother her, denies any other sx's. Has appt w neurology tomorrow.   Headache  Associated symptoms include photophobia. Pertinent negatives include no abdominal pain, blurred vision, nausea or vomiting.      Past Medical History  Diagnosis Date  . Asthma   . Gonorrhea 2011  . Chlamydia 2011    Past Surgical History  Procedure Laterality Date  . Knee surgery  2007    Family History  Problem Relation Age of Onset  . Anesthesia problems Neg Hx   . Diabetes Maternal Grandmother   . Hypertension Maternal Grandmother     History  Substance Use Topics  . Smoking status: Former Smoker -- 0.50 packs/day for 5 years    Types: Cigarettes    Quit date: 03/18/2012  . Smokeless tobacco: Never Used  . Alcohol Use: No    Allergies: No Known Allergies  Prescriptions prior to admission  Medication Sig Dispense Refill  . Hydrocodone-Acetaminophen (VICODIN) 5-300 MG TABS Take 1 tablet by mouth as needed (headache).        Review of Systems  Eyes: Positive for photophobia. Negative for blurred vision.  Gastrointestinal: Negative for nausea, vomiting and abdominal pain.  Neurological: Positive for headaches.  All other systems reviewed and are negative.   Physical Exam   Blood pressure 117/68, pulse 83, temperature 98.1 F (36.7 C), temperature source Oral, resp. rate 18, last menstrual period 03/04/2012, not currently  breastfeeding.  Physical Exam  MAU Course  Procedures    Assessment and Plan  IUP at [redacted]w[redacted]d Chronic HA's FHR cat 1 toco quiet   Will give 2 tabs fioicet If helps will send home w rx   Makhi Muzquiz M 11/15/2012, 4:28 PM   Addendum:   Pt states pain is much better, declines any other meds rx fioricet 1-2tabs q4h, #30 F/u neurology tmrw as scheduled  S.Anida Deol, CNM

## 2012-11-16 ENCOUNTER — Ambulatory Visit (INDEPENDENT_AMBULATORY_CARE_PROVIDER_SITE_OTHER): Payer: Medicaid Other | Admitting: Diagnostic Neuroimaging

## 2012-11-16 ENCOUNTER — Encounter: Payer: Self-pay | Admitting: Diagnostic Neuroimaging

## 2012-11-16 VITALS — BP 113/68 | HR 79 | Temp 97.7°F | Ht 66.0 in | Wt 272.0 lb

## 2012-11-16 DIAGNOSIS — O26899 Other specified pregnancy related conditions, unspecified trimester: Secondary | ICD-10-CM

## 2012-11-16 DIAGNOSIS — R519 Headache, unspecified: Secondary | ICD-10-CM

## 2012-11-16 DIAGNOSIS — R51 Headache: Secondary | ICD-10-CM

## 2012-11-16 LAB — URINE CULTURE: Colony Count: 60000

## 2012-11-16 NOTE — Patient Instructions (Signed)
Headaches, Frequently Asked Questions MIGRAINE HEADACHES Q: What is migraine? What causes it? How can I treat it? A: Generally, migraine headaches begin as a dull ache. Then they develop into a constant, throbbing, and pulsating pain. You may experience pain at the temples. You may experience pain at the front or back of one or both sides of the head. The pain is usually accompanied by a combination of:  Nausea.  Vomiting.  Sensitivity to light and noise. Some people (about 15%) experience an aura (see below) before an attack. The cause of migraine is believed to be chemical reactions in the brain. Treatment for migraine may include over-the-counter or prescription medications. It may also include self-help techniques. These include relaxation training and biofeedback.  Q: What is a trigger? A: Certain physical or environmental factors can lead to or "trigger" a migraine. These include:  Foods.  Hormonal changes.  Weather.  Stress. It is important to remember that triggers are different for everyone. To help prevent migraine attacks, you need to figure out which triggers affect you. Keep a headache diary. This is a good way to track triggers. The diary will help you talk to your healthcare professional about your condition. Q: Does weather affect migraines? A: Bright sunshine, hot, humid conditions, and drastic changes in barometric pressure may lead to, or "trigger," a migraine attack in some people. But studies have shown that weather does not act as a trigger for everyone with migraines. Q: What is the link between migraine and hormones? A: Hormones start and regulate many of your body's functions. Hormones keep your body in balance within a constantly changing environment. The levels of hormones in your body are unbalanced at times. Examples are during menstruation, pregnancy, or menopause. That can lead to a migraine attack. In fact, about three quarters of all women with migraine  report that their attacks are related to the menstrual cycle.   Document Released: 03/30/2003 Document Revised: 04/01/2011 Document Reviewed: 09/07/2007 [facility] Patient Information 2014 [place], [place].

## 2012-11-16 NOTE — Progress Notes (Signed)
[facility]  PATIENT: [name] DOB: [birth year]  REFERRING CLINICIAN: Alessandra Grout, NP HISTORY FROM: patient and mother REASON FOR VISIT: new consult   HISTORICAL  CHIEF COMPLAINT:  Chief Complaint  Patient presents with  . Headache    HISTORY OF PRESENT ILLNESS:   22 year old right-handed G2 P54 female, now pregnant and 37 weeks estimated gestational age, here for evaluation of headaches.  Patient reports 5-6 days of headache per week ever since the beginning of her pregnancy. Headaches range in severity from mild to severe. No specific triggering, aggravating or alleviating factors. Headaches tend to be worse in the evening.  She describes bitemporal throbbing, pounding headaches with blurred vision, feeling off balance, seeing red and white dots, photophobia and phonophobia. No nausea or vomiting with these headaches.  Patient has been taking extra strength Tylenol with minimal relief. Yesterday she was prescribed hydrocodone and Tylenol as well as Fioricet tabs, which mildly help.  Patient had headaches during her first pregnancy as well. No prior history of migraine headaches. No family history of migraine headaches.  REVIEW OF SYSTEMS: Full 14 system review of systems performed and notable only for blurred vision headache.  ALLERGIES: No Known Allergies  HOME MEDICATIONS: Outpatient Prescriptions Prior to Visit  Medication Sig Dispense Refill  . butalbital-acetaminophen-caffeine (FIORICET, ESGIC) 50-325-40 MG per tablet Take 1-2 tablets by mouth every 4 (four) hours as needed for headache.  14 tablet  0  . Hydrocodone-Acetaminophen (VICODIN) 5-300 MG TABS Take 1 tablet by mouth as needed (headache).       No facility-administered medications prior to visit.    PAST MEDICAL HISTORY: Past Medical History  Diagnosis Date  . Asthma   . Gonorrhea 2011  . Chlamydia 2011    PAST SURGICAL HISTORY: Past Surgical History  Procedure  Laterality Date  . Knee surgery  2007    FAMILY HISTORY: Family History  Problem Relation Age of Onset  . Anesthesia problems Neg Hx   . Diabetes Maternal Grandmother   . Hypertension Maternal Grandmother     SOCIAL HISTORY:  History   Social History  . Marital Status: Single    Spouse Name: N/A    Number of Children: 1  . Years of Education: College   Occupational History  .      n/a   Social History Main Topics  . Smoking status: Former Smoker -- 0.50 packs/day for 5 years    Types: Cigarettes    Quit date: 03/18/2012  . Smokeless tobacco: Never Used  . Alcohol Use: No     Comment: Quit: 03/17/12  . Drug Use: No  . Sexual Activity: Not Currently   Other Topics Concern  . Not on file   Social History Narrative   Patient lives at home with her family   Caffeine Use: medication     PHYSICAL EXAM  Filed Vitals:   11/16/12 0848  BP: 113/68  Pulse: 79  Temp: 97.7 F (36.5 C)  TempSrc: Oral  Height: 5\' 6"  (1.676 m)  Weight: 272 lb (123.378 kg)    Not recorded    Body mass index is 43.92 kg/(m^2).  GENERAL EXAM: Patient is in no distress; NECK SUPPLE.  CARDIOVASCULAR: Regular rate and rhythm, no murmurs, no carotid bruits  NEUROLOGIC: MENTAL STATUS: awake, alert, language fluent, comprehension intact, naming intact CRANIAL NERVE: no papilledema on fundoscopic exam, COLORED CONTACT LENSES, pupils equal and reactive to light, visual fields full to confrontation, extraocular muscles intact, no nystagmus, facial sensation and  strength symmetric, uvula midline, shoulder shrug symmetric, tongue midline. MOTOR: normal bulk and tone, full strength in the BUE, BLE SENSORY: normal and symmetric to light touch, pinprick, temperature, vibration COORDINATION: finger-nose-finger, fine finger movements normal REFLEXES: deep tendon reflexes present and symmetric GAIT/STATION: narrow based gait; able to walk tandem; romberg is negative   DIAGNOSTIC DATA (LABS,  IMAGING, TESTING) - I reviewed patient records, labs, notes, testing and imaging myself where available.  Lab Results  Component Value Date   WBC 7.8 04/15/2012   HGB 12.3 04/15/2012   HCT 36.6 04/15/2012   MCV 79.6 04/15/2012   PLT 265 04/15/2012      Component Value Date/Time   NA 135 04/15/2012 1035   K 4.1 04/15/2012 1035   CL 102 04/15/2012 1035   CO2 25 04/15/2012 1035   GLUCOSE 91 04/15/2012 1035   BUN 9 04/15/2012 1035   CREATININE 0.74 04/15/2012 1035   CALCIUM 8.6 04/15/2012 1035   PROT 6.9 04/15/2012 1035   ALBUMIN 3.4* 04/15/2012 1035   AST 14 04/15/2012 1035   ALT 10 04/15/2012 1035   ALKPHOS 89 04/15/2012 1035   BILITOT 0.2* 04/15/2012 1035   GFRNONAA >90 04/15/2012 1035   GFRAA >90 04/15/2012 1035   No results found for this basename: CHOL, HDL, LDLCALC, LDLDIRECT, TRIG, CHOLHDL   No results found for this basename: HGBA1C   No results found for this basename: VITAMINB12   No results found for this basename: TSH      ASSESSMENT AND PLAN  22 y.o. year old female here with migrainous headaches since beginning of pregnancy. Limited treatment options during pregnancy. Neurologic exam is unremarkable.  PLAN: - continue tylenol or hydrocodone/APAP prn HA - stay hydrated, eat regularly, and get plenty of rest - after delivery, may consider further testing (MRI brain) and anti-migraine meds if HA continue  Return in about 3 months (around 02/16/2013) for with Edison Nasuti, MD 11/16/2012, 9:24 AM Certified in Neurology, Neurophysiology and Neuroimaging  [facility] [address] [place], [place] [address] [phone]

## 2012-11-30 ENCOUNTER — Inpatient Hospital Stay (HOSPITAL_COMMUNITY)
Admission: AD | Admit: 2012-11-30 | Discharge: 2012-12-01 | Disposition: A | Discharge status: Home / Self Care | Payer: Medicaid Other | Source: Ambulatory Visit | Attending: Obstetrics and Gynecology | Admitting: Obstetrics and Gynecology

## 2012-11-30 ENCOUNTER — Encounter (HOSPITAL_COMMUNITY): Payer: Self-pay | Admitting: *Deleted

## 2012-11-30 DIAGNOSIS — O479 False labor, unspecified: Secondary | ICD-10-CM | POA: Insufficient documentation

## 2012-11-30 NOTE — MAU Note (Signed)
PT SAYS   SHE STARTED  HURTING AT 7PM.  LAST WEEK - IN OFFFICE 1-2 CM.   LAST SEX-  Friday.  DENIES HSV    OR MRSA.

## 2012-12-04 ENCOUNTER — Encounter (HOSPITAL_COMMUNITY): Payer: Self-pay | Admitting: *Deleted

## 2012-12-04 ENCOUNTER — Inpatient Hospital Stay (HOSPITAL_COMMUNITY)
Admission: AD | Admit: 2012-12-04 | Discharge: 2012-12-04 | Disposition: A | Discharge status: Home / Self Care | Payer: Medicaid Other | Source: Ambulatory Visit | Attending: Obstetrics and Gynecology | Admitting: Obstetrics and Gynecology

## 2012-12-04 NOTE — MAU Provider Note (Signed)
  History     CSN: [id number]  Arrival date and time: 12/04/12 1615   None     Chief Complaint  Patient presents with  . Contractions   HPI Comments: Pt is a G3P1 at [redacted]w[redacted]d arrives unannounced w c/o 4-6 min ctx since 230pm. Denies any VB or LOF, reports GFM. States she had appt today but "missed it" c/o feeling constant discomfort in her vagina feels ctx across abdomen, wants to know if there's anything to "stop" ctx, requesting IOL.      Past Medical History  Diagnosis Date  . Asthma   . Gonorrhea 2011  . Chlamydia 2011    Past Surgical History  Procedure Laterality Date  . Knee surgery  2007    Family History  Problem Relation Age of Onset  . Anesthesia problems Neg Hx   . Diabetes Maternal Grandmother   . Hypertension Maternal Grandmother     History  Substance Use Topics  . Smoking status: Former Smoker -- 0.50 packs/day for 5 years    Types: Cigarettes    Quit date: 03/18/2012  . Smokeless tobacco: Never Used  . Alcohol Use: No     Comment: Quit: 03/17/12    Allergies: No Known Allergies  No prescriptions prior to admission    Review of Systems  All other systems reviewed and are negative.   Physical Exam   Blood pressure 116/70, pulse 83, temperature 97.6 F (36.4 C), temperature source Oral, resp. rate 20, height 5\' 7"  (1.702 m), weight 275 lb 12.8 oz (125.102 kg), last menstrual period 03/04/2012.  Physical Exam  Nursing note and vitals reviewed. Constitutional: She is oriented to person, place, and time. She appears well-developed and well-nourished. No distress.  HENT:  Head: Normocephalic.  Cardiovascular: Normal rate.   Respiratory: Effort normal.  GI: Soft.  Genitourinary: Vagina normal.  Musculoskeletal: Normal range of motion.  Neurological: She is alert and oriented to person, place, and time. She has normal reflexes.  Skin: Skin is warm and dry.  Psychiatric: She has a normal mood and affect. Her behavior is normal.    MAU  Course  Procedures    Assessment and Plan  IUP at [redacted]w[redacted]d FHR cat 1 Cervix 1/th/ballottable, vtx Not in labor dc'd home, rv'd [facility] and true vs false labor and indications for IOL Keep appt Monday   Isam Unrein M 12/04/2012, 5:32 PM

## 2012-12-07 ENCOUNTER — Inpatient Hospital Stay (HOSPITAL_COMMUNITY)
Admission: AD | Admit: 2012-12-07 | Discharge: 2012-12-10 | DRG: 774 | Disposition: A | Discharge status: Home / Self Care | Payer: Medicaid Other | Source: Ambulatory Visit | Attending: Obstetrics and Gynecology | Admitting: Obstetrics and Gynecology

## 2012-12-07 DIAGNOSIS — N739 Female pelvic inflammatory disease, unspecified: Secondary | ICD-10-CM | POA: Diagnosis present

## 2012-12-07 DIAGNOSIS — Z87891 Personal history of nicotine dependence: Secondary | ICD-10-CM

## 2012-12-07 DIAGNOSIS — O98319 Other infections with a predominantly sexual mode of transmission complicating pregnancy, unspecified trimester: Secondary | ICD-10-CM | POA: Diagnosis present

## 2012-12-07 DIAGNOSIS — A5619 Other chlamydial genitourinary infection: Secondary | ICD-10-CM | POA: Diagnosis present

## 2012-12-07 DIAGNOSIS — D649 Anemia, unspecified: Secondary | ICD-10-CM | POA: Diagnosis not present

## 2012-12-07 DIAGNOSIS — O9903 Anemia complicating the puerperium: Secondary | ICD-10-CM | POA: Diagnosis not present

## 2012-12-08 ENCOUNTER — Encounter (HOSPITAL_COMMUNITY): Payer: Self-pay

## 2012-12-08 ENCOUNTER — Encounter (HOSPITAL_COMMUNITY): Payer: Medicaid Other | Admitting: Anesthesiology

## 2012-12-08 ENCOUNTER — Inpatient Hospital Stay (HOSPITAL_COMMUNITY): Payer: Medicaid Other | Admitting: Anesthesiology

## 2012-12-08 DIAGNOSIS — R519 Headache, unspecified: Secondary | ICD-10-CM | POA: Insufficient documentation

## 2012-12-08 DIAGNOSIS — F17200 Nicotine dependence, unspecified, uncomplicated: Secondary | ICD-10-CM | POA: Insufficient documentation

## 2012-12-08 DIAGNOSIS — O2610 Low weight gain in pregnancy, unspecified trimester: Secondary | ICD-10-CM | POA: Insufficient documentation

## 2012-12-08 DIAGNOSIS — A749 Chlamydial infection, unspecified: Secondary | ICD-10-CM | POA: Insufficient documentation

## 2012-12-08 LAB — RPR: RPR Ser Ql: NONREACTIVE

## 2012-12-08 LAB — ABO/RH: ABO/RH(D): O POS

## 2012-12-08 LAB — CBC
MCH: 25.2 pg — ABNORMAL LOW (ref 26.0–34.0)
MCV: 74.2 fL — ABNORMAL LOW (ref 78.0–100.0)
Platelets: 327 10*3/uL (ref 150–400)
RBC: 4.57 MIL/uL (ref 3.87–5.11)
RDW: 15.8 % — ABNORMAL HIGH (ref 11.5–15.5)
WBC: 12.5 10*3/uL — ABNORMAL HIGH (ref 4.0–10.5)

## 2012-12-08 LAB — TYPE AND SCREEN: Antibody Screen: NEGATIVE

## 2012-12-08 MED ORDER — ZOLPIDEM TARTRATE 5 MG PO TABS: 5.0000 mg | ORAL_TABLET | Freq: Every evening | ORAL | Status: DC | PRN

## 2012-12-08 MED ORDER — BENZOCAINE-MENTHOL 20-0.5 % EX AERO
1.0000 "application " | INHALATION_SPRAY | CUTANEOUS | Status: DC | PRN
  Administered 2012-12-08: 1 via TOPICAL
  Filled 2012-12-08: qty 56

## 2012-12-08 MED ORDER — PRENATAL MULTIVITAMIN CH
1.0000 | ORAL_TABLET | Freq: Every day | ORAL | Status: DC
  Administered 2012-12-09 – 2012-12-10 (×2): 1 via ORAL
  Filled 2012-12-08 (×2): qty 1

## 2012-12-08 MED ORDER — OXYTOCIN 40 UNITS IN LACTATED RINGERS INFUSION - SIMPLE MED: 1.0000 m[IU]/min | INTRAVENOUS | Status: DC

## 2012-12-08 MED ORDER — CITRIC ACID-SODIUM CITRATE 334-500 MG/5ML PO SOLN: 30.0000 mL | ORAL | Status: DC | PRN

## 2012-12-08 MED ORDER — FENTANYL 2.5 MCG/ML BUPIVACAINE 1/10 % EPIDURAL INFUSION (WH - ANES)
INTRAMUSCULAR | Status: DC | PRN
  Administered 2012-12-08: 14 mL/h via EPIDURAL

## 2012-12-08 MED ORDER — MEASLES, MUMPS & RUBELLA VAC ~~LOC~~ INJ
0.5000 mL | INJECTION | Freq: Once | SUBCUTANEOUS | Status: DC
  Filled 2012-12-08: qty 0.5

## 2012-12-08 MED ORDER — EPHEDRINE 5 MG/ML INJ
10.0000 mg | INTRAVENOUS | Status: DC | PRN
  Filled 2012-12-08: qty 4
  Filled 2012-12-08: qty 2

## 2012-12-08 MED ORDER — WITCH HAZEL-GLYCERIN EX PADS: 1.0000 "application " | MEDICATED_PAD | CUTANEOUS | Status: DC | PRN

## 2012-12-08 MED ORDER — OXYCODONE-ACETAMINOPHEN 5-325 MG PO TABS: 1.0000 | ORAL_TABLET | ORAL | Status: DC | PRN

## 2012-12-08 MED ORDER — OXYTOCIN BOLUS FROM INFUSION
500.0000 mL | INTRAVENOUS | Status: DC
  Administered 2012-12-08: 500 mL via INTRAVENOUS

## 2012-12-08 MED ORDER — LIDOCAINE HCL (PF) 1 % IJ SOLN
30.0000 mL | INTRAMUSCULAR | Status: DC | PRN
  Filled 2012-12-08 (×2): qty 30

## 2012-12-08 MED ORDER — ONDANSETRON HCL 4 MG/2ML IJ SOLN: 4.0000 mg | Freq: Four times a day (QID) | INTRAMUSCULAR | Status: DC | PRN

## 2012-12-08 MED ORDER — LACTATED RINGERS IV SOLN
500.0000 mL | INTRAVENOUS | Status: DC | PRN
  Administered 2012-12-08: 300 mL via INTRAVENOUS

## 2012-12-08 MED ORDER — IBUPROFEN 600 MG PO TABS
600.0000 mg | ORAL_TABLET | Freq: Four times a day (QID) | ORAL | Status: DC
  Administered 2012-12-08 – 2012-12-10 (×7): 600 mg via ORAL
  Filled 2012-12-08 (×8): qty 1

## 2012-12-08 MED ORDER — FENTANYL 2.5 MCG/ML BUPIVACAINE 1/10 % EPIDURAL INFUSION (WH - ANES)
14.0000 mL/h | INTRAMUSCULAR | Status: DC | PRN
  Filled 2012-12-08: qty 125

## 2012-12-08 MED ORDER — DIPHENHYDRAMINE HCL 50 MG/ML IJ SOLN: 12.5000 mg | INTRAMUSCULAR | Status: DC | PRN

## 2012-12-08 MED ORDER — OXYCODONE-ACETAMINOPHEN 5-325 MG PO TABS
1.0000 | ORAL_TABLET | ORAL | Status: DC | PRN
  Administered 2012-12-09 – 2012-12-10 (×3): 1 via ORAL
  Filled 2012-12-08 (×3): qty 1

## 2012-12-08 MED ORDER — SIMETHICONE 80 MG PO CHEW: 80.0000 mg | CHEWABLE_TABLET | ORAL | Status: DC | PRN

## 2012-12-08 MED ORDER — FENTANYL CITRATE 0.05 MG/ML IJ SOLN: 100.0000 ug | INTRAMUSCULAR | Status: DC | PRN

## 2012-12-08 MED ORDER — DIBUCAINE 1 % RE OINT: 1.0000 "application " | TOPICAL_OINTMENT | RECTAL | Status: DC | PRN

## 2012-12-08 MED ORDER — ONDANSETRON HCL 4 MG PO TABS: 4.0000 mg | ORAL_TABLET | ORAL | Status: DC | PRN

## 2012-12-08 MED ORDER — LIDOCAINE HCL (PF) 1 % IJ SOLN
INTRAMUSCULAR | Status: DC | PRN
  Administered 2012-12-08 (×2): 5 mL

## 2012-12-08 MED ORDER — IBUPROFEN 600 MG PO TABS
600.0000 mg | ORAL_TABLET | Freq: Four times a day (QID) | ORAL | Status: DC | PRN
  Administered 2012-12-08: 600 mg via ORAL
  Filled 2012-12-08: qty 1

## 2012-12-08 MED ORDER — PHENYLEPHRINE 40 MCG/ML (10ML) SYRINGE FOR IV PUSH (FOR BLOOD PRESSURE SUPPORT)
80.0000 ug | PREFILLED_SYRINGE | INTRAVENOUS | Status: DC | PRN
  Filled 2012-12-08: qty 2

## 2012-12-08 MED ORDER — LACTATED RINGERS IV SOLN: 500.0000 mL | Freq: Once | INTRAVENOUS | Status: DC

## 2012-12-08 MED ORDER — EPHEDRINE 5 MG/ML INJ
10.0000 mg | INTRAVENOUS | Status: DC | PRN
  Filled 2012-12-08: qty 2

## 2012-12-08 MED ORDER — SENNOSIDES-DOCUSATE SODIUM 8.6-50 MG PO TABS
2.0000 | ORAL_TABLET | ORAL | Status: DC
  Administered 2012-12-09 – 2012-12-10 (×2): 2 via ORAL
  Filled 2012-12-08 (×2): qty 2

## 2012-12-08 MED ORDER — PHENYLEPHRINE 40 MCG/ML (10ML) SYRINGE FOR IV PUSH (FOR BLOOD PRESSURE SUPPORT)
80.0000 ug | PREFILLED_SYRINGE | INTRAVENOUS | Status: DC | PRN
  Filled 2012-12-08: qty 10
  Filled 2012-12-08: qty 2

## 2012-12-08 MED ORDER — TERBUTALINE SULFATE 1 MG/ML IJ SOLN: 0.2500 mg | Freq: Once | INTRAMUSCULAR | Status: DC | PRN

## 2012-12-08 MED ORDER — ONDANSETRON HCL 4 MG/2ML IJ SOLN: 4.0000 mg | INTRAMUSCULAR | Status: DC | PRN

## 2012-12-08 MED ORDER — ACETAMINOPHEN 325 MG PO TABS: 650.0000 mg | ORAL_TABLET | ORAL | Status: DC | PRN

## 2012-12-08 MED ORDER — OXYTOCIN 40 UNITS IN LACTATED RINGERS INFUSION - SIMPLE MED
62.5000 mL/h | INTRAVENOUS | Status: DC
  Filled 2012-12-08: qty 1000

## 2012-12-08 MED ORDER — DIPHENHYDRAMINE HCL 25 MG PO CAPS: 25.0000 mg | ORAL_CAPSULE | Freq: Four times a day (QID) | ORAL | Status: DC | PRN

## 2012-12-08 MED ORDER — LANOLIN HYDROUS EX OINT: TOPICAL_OINTMENT | CUTANEOUS | Status: DC | PRN

## 2012-12-08 MED ORDER — LACTATED RINGERS IV SOLN
INTRAVENOUS | Status: DC
  Administered 2012-12-08: 125 mL/h via INTRAVENOUS
  Administered 2012-12-08: 06:00:00 via INTRAVENOUS

## 2012-12-08 NOTE — Progress Notes (Signed)
  Subjective: Pt in bed breathing through UCs. Pt reports UCs are coming closer together now.  Objective: BP 158/77  Pulse 81  Temp(Src) 98 F (36.7 C) (Oral)  Resp 18  Ht 5\' 7"  (1.702 m)  Wt 275 lb (124.739 kg)  BMI 43.06 kg/m2  SpO2 99%  LMP 03/04/2012      FHT:  Cat I UC:   regular, every 3-4 minutes  SVE:   Deferred at this time d/t SROM  Assessment / Plan:  Labor: Early labor with SROM;  Pitocin not initiated at this time d/t UCs Q 3-4 min  Preeclampsia: no s/s Fetal Wellbeing: Cat I Pain Control: Breathing and relaxation I/D: GBS neg; SROM at 2300 last night; Afebrile Anticipated MOD: SVD    [name] 12/08/2012, 6:37 AM

## 2012-12-08 NOTE — H&P (Signed)
  [name] is a 22 y.o. female, G3P1011 at [redacted]w[redacted]d, presenting for SROM at 2300 tonight with mild irregular UCs starting just after.  Denies VB, recent fever, resp or GI c/o's, UTI or PIH s/s. GFM.  Patient Active Problem List   Diagnosis Date Noted  . Chlamydia infection 12/08/2012  . Low maternal weight gain 12/08/2012  . Generalized headaches 12/08/2012  . Smoker 12/08/2012  . 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 Contact with or exposure to venereal diseases 07/17/2011  . Morbid obesity 03/25/2011    History of present pregnancy: Patient entered care at 5 weeks.   EDC of 12/09/12 was established by LMP.   Anatomy scan:  19 weeks, with normal findings and an anterior placenta.   Additional [place] evaluations: [redacted]w[redacted]d for low weight gain/growth - EFW 27th%ile, AFI 50th%ile, vtx.   Significant prenatal events:  none   Last evaluation:  12/07/12 at [redacted]w[redacted]d    3 cm / 50% / -2  OB History   Grav Para Term Preterm Abortions TAB SAB Ect Mult Living   3 1 1  0 1 0 1 0 0 1     Past Medical History  Diagnosis Date  . Asthma   . Gonorrhea 2011  . Chlamydia 2011   Past Surgical History  Procedure Laterality Date  . Knee surgery  2007   Family History: family history includes Diabetes in her maternal grandmother; Hypertension in her maternal grandmother. There is no history of Anesthesia problems. Social History:  reports that she quit smoking about 8 months ago. Her smoking use included Cigarettes. She has a 2.5 pack-year smoking history. She has never used smokeless tobacco. She reports that she does not drink alcohol or use illicit drugs.   Prenatal Transfer Tool  Maternal Diabetes: No Genetic Screening: Normal Maternal Ultrasounds/Referrals: Normal Fetal Ultrasounds or other Referrals:  None Maternal Substance Abuse:  Yes:  Type: Smoker Significant Maternal Medications:  None Significant Maternal Lab Results: Lab values include: Group B Strep negative, Other: HSV  2    ROS: see HPI above, all other systems are negative   No Known Allergies   Dilation: 3 Effacement (%): 70 Station: -2 Exam by:: J Daquawn Seelman CNM Blood pressure 131/78, pulse 94, temperature 98 F (36.7 C), temperature source Oral, resp. rate 18, last menstrual period 03/04/2012, SpO2 99.00%.  Chest clear Heart RRR without murmur Abd gravid, NT Ext: WNL  SSE: no lesions noted on vulva, vagina, or cervix  FHR: Cat I UCs:  Irregular  Prenatal labs: ABO, Rh:  O pos Antibody:  neg Rubella:   Immune RPR:   Neg HBsAg:   neg HIV: Non-reactive (04/15 0000) Neg GBS:  Neg Sickle cell/Hgb electrophoresis:  n/a Pap:  Last pap 02/04/11  WNL GC:  neg Chlamydia:  neg Genetic screenings:  AFP neg; 1st trimester neg Glucola:  Early 1 hr 83;  1 hr 80 Other:  PIH labs on 11/09/12 all WNL     Assessment/Plan: IUP at [redacted]w[redacted]d SROM with irregular mild UCs GBS neg HSV 2 pos - was not on prophylaxis Valtrex R&B of labor augmentation vs expectant management of SROM with mild irregular UCs were reviewed with the patient, patient verbalizes understanding of these risks and wishes to proceed with labor augmentation  Admit to BS per c/w Dr. Richardson Dopp at attending MD Routine [facility] admission orders Pitocin initiated    Rowan Blase, MSN 12/08/2012, 2:08 AM

## 2012-12-08 NOTE — Anesthesia Preprocedure Evaluation (Signed)
Anesthesia Evaluation  Patient identified by MRN, date of birth, ID band Patient awake    Reviewed: Allergy & Precautions, H&P , NPO status , Patient's Chart, lab work & pertinent test results  Airway Mallampati: II TM Distance: >3 FB Neck ROM: full    Dental no notable dental hx.    Pulmonary former smoker,    Pulmonary exam normal       Cardiovascular negative cardio ROS      Neuro/Psych negative psych ROS   GI/Hepatic negative GI ROS, Neg liver ROS,   Endo/Other  Morbid obesity  Renal/GU negative Renal ROS  negative genitourinary   Musculoskeletal negative musculoskeletal ROS (+)   Abdominal (+) + obese,   Peds  Hematology negative hematology ROS (+)   Anesthesia Other Findings   Reproductive/Obstetrics (+) Pregnancy                           Anesthesia Physical Anesthesia Plan  ASA: III  Anesthesia Plan: Epidural   Post-op Pain Management:    Induction:   Airway Management Planned:   Additional Equipment:   Intra-op Plan:   Post-operative Plan:   Informed Consent: I have reviewed the patients History and Physical, chart, labs and discussed the procedure including the risks, benefits and alternatives for the proposed anesthesia with the patient or authorized representative who has indicated his/her understanding and acceptance.     Plan Discussed with:   Anesthesia Plan Comments:         Anesthesia Quick Evaluation

## 2012-12-08 NOTE — MAU Note (Signed)
Pt states that around 1115 she had some trickling of fluid. States that it is constantly trickling out. Denies vaginal bleeding. States that she did lose her mucus plug tonight. States good FM.

## 2012-12-08 NOTE — Lactation Note (Signed)
This note was copied from the chart of [name]. Lactation Consultation Note  Patient Name: [name] [id number] Date: 12/08/2012 Reason for consult: Initial assessment;Infant < 6lbs Mom reports to [facility] she plans to breast and bottle feed. Mom reports she BF for 2 weeks with her 1st child because he milk supply never came in well. Mom reports her 1st baby had some difficulty with the latch and sustaining a latch but this baby is doing well per her report. Discussed the risk of early supplementation to milk production, increased risk of engorgement and protecting milk supply. Encouraged Mom to keep this baby at the breast and delay supplements unless medically necessary to promote a good milk supply. Encouraged Mom to BF with feeding ques and to call for LC to observe latch due to her difficulties with her 1st baby. If Mom does supplement, advised to Bf 1st, guidelines for supplementing with BF reviewed with Mom, cluster feeding reviewed with Mom. Lactation brochure left for review, advised of OP services and support group. Encouraged to call for assist.   Maternal Data Formula Feeding for Exclusion: Yes Reason for exclusion: Mother's choice to formula and breast feed on admission (Mom reports to [facility] she plans to breast/formula) Infant to breast within first hour of birth: Yes Has patient been taught Hand Expression?: No (Mom reports she has been demonstrated hand expression) Does the patient have breastfeeding experience prior to this delivery?: Yes  Feeding Feeding Type: Breast Fed Length of feed: 15 min  LATCH Score/Interventions Latch: Grasps breast easily, tongue down, lips flanged, rhythmical sucking.  Audible Swallowing: A few with stimulation Intervention(s): Skin to skin;Hand expression Intervention(s): Skin to skin;Hand expression  Type of Nipple: Everted at rest and after stimulation  Comfort (Breast/Nipple): Soft / non-tender     Hold (Positioning):  No assistance needed to correctly position infant at breast. Intervention(s): Breastfeeding basics reviewed;Skin to skin  LATCH Score: 9  Lactation Tools Discussed/Used WIC Program: Yes   Consult Status Consult Status: Follow-up Date: 12/09/12 Follow-up type: In-patient    Alfred Levins 12/08/2012, 6:08 PM

## 2012-12-08 NOTE — Anesthesia Procedure Notes (Signed)
Epidural Patient location during procedure: OB Start time: 12/08/2012 6:25 AM End time: 12/08/2012 6:29 AM  Staffing Anesthesiologist: Leilani Able Performed by: anesthesiologist   Preanesthetic Checklist Completed: patient identified, surgical consent, pre-op evaluation, timeout performed, IV checked, risks and benefits discussed and monitors and equipment checked  Epidural Patient position: sitting Prep: site prepped and draped and DuraPrep Patient monitoring: continuous pulse ox and blood pressure Approach: midline Injection technique: LOR air  Needle:  Needle type: Tuohy  Needle gauge: 17 G Needle length: 9 cm and 9 Needle insertion depth: 9 cm Catheter type: closed end flexible Catheter size: 19 Gauge Catheter at skin depth: 15 cm Test dose: negative and Other  Assessment Sensory level: T9 Events: blood not aspirated, injection not painful, no injection resistance, negative IV test and no paresthesia  Additional Notes Reason for block:procedure for pain

## 2012-12-08 NOTE — Progress Notes (Signed)
  Subjective: Pt in much more pain with UCs and requesting epidural.    Objective: BP 128/68  Pulse 83  Temp(Src) 97.7 F (36.5 C) (Oral)  Resp 20  Ht 5\' 7"  (1.702 m)  Wt 275 lb (124.739 kg)  BMI 43.06 kg/m2  SpO2 99%  LMP 03/04/2012      FHT:  Reassuring, but unclear.  Variables present however no UCs on toco to assess the variables.  Pt is up moving around preparing for epidural, continue to monitor FHT after epidural is placed. UC:   Toco not picking up  SVE:   Dilation: 4 Effacement (%): 80 Station: -2 Exam by:: JOxley,CNM  Assessment / Plan:  Labor: Early labor with SROM  Preeclampsia: no s/s  Fetal Wellbeing: reassuring but unclear while preparing for epidural Pain Control: Epidural I/D: GBS neg; ROM x 7 hours; Afebrile  Anticipated MOD: SVD   [name] 12/08/2012, 6:42 AM

## 2012-12-09 LAB — CBC
Hemoglobin: 10.6 g/dL — ABNORMAL LOW (ref 12.0–15.0)
MCH: 25.2 pg — ABNORMAL LOW (ref 26.0–34.0)
Platelets: 300 10*3/uL (ref 150–400)
RBC: 4.21 MIL/uL (ref 3.87–5.11)
RDW: 16 % — ABNORMAL HIGH (ref 11.5–15.5)
WBC: 14.5 10*3/uL — ABNORMAL HIGH (ref 4.0–10.5)

## 2012-12-09 MED ORDER — POLYSACCHARIDE IRON COMPLEX 150 MG PO CAPS
150.0000 mg | ORAL_CAPSULE | Freq: Every day | ORAL | Status: DC
  Administered 2012-12-09 – 2012-12-10 (×2): 150 mg via ORAL
  Filled 2012-12-09 (×2): qty 1

## 2012-12-09 NOTE — Lactation Note (Signed)
This note was copied from the chart of [name]. Lactation Consultation Note        Follow up consult with this mom and baby. I observed mom latching her baby, sitting forward with baby vertical   In her lap, and placing her nipple in the baby's mouth. I showed her how to use cross cradle hold, and to bring the baby to her, and was able to obtain a much deeper latch. I also had mom sit back while feeding.  The baby latches easily, with visible swallows and vigorous sucking. Mom knows to call for questions/concerns.  Patient Name: Alisha Phala Schraeder [id number] Date: 12/09/2012 Reason for consult: Follow-up assessment   Maternal Data    Feeding Feeding Type: Breast Fed Length of feed: 15 min  LATCH Score/Interventions Latch: Grasps breast easily, tongue down, lips flanged, rhythmical sucking.  Audible Swallowing: A few with stimulation  Type of Nipple: Everted at rest and after stimulation  Comfort (Breast/Nipple): Soft / non-tender     Hold (Positioning): Assistance needed to correctly position infant at breast and maintain latch. Intervention(s): Breastfeeding basics reviewed;Support Pillows;Position options;Skin to skin  LATCH Score: 8  Lactation Tools Discussed/Used     Consult Status Consult Status: Follow-up Date: 12/10/12 Follow-up type: In-patient    Alfred Levins 12/09/2012, 5:29 PM

## 2012-12-09 NOTE — Progress Notes (Signed)
Post Partum Day 1: S/P SVD with a 1st degree laceration  Subjective: Patient up ad lib, denies syncope or dizziness. Feeding:  Breastfeeding Contraceptive plan:   Nexplanon  Objective: Blood pressure 134/70, pulse 78, temperature 97.9 F (36.6 C), temperature source Oral, resp. rate 20, height 5\' 7"  (1.702 m), weight 275 lb (124.739 kg), last menstrual period 03/04/2012, SpO2 98.00%, unknown if currently breastfeeding.  Physical Exam:  General: alert, cooperative and no distress Lochia: appropriate Uterine Fundus: firm Incision: healing well DVT Evaluation: No evidence of DVT seen on physical exam. Negative Homan's sign.   Recent Labs  12/08/12 0135 12/09/12 0555  HGB 11.5* 10.6*  HCT 33.9* 31.3*    Assessment/Plan: S/P Vaginal delivery day 1 Asymptomatic anemia - Rx FE Continue current care Plan for discharge tomorrow   LOS: 2 days   Maron Stanzione 12/09/2012, 6:24 AM

## 2012-12-09 NOTE — Anesthesia Postprocedure Evaluation (Signed)
  Anesthesia Post-op Note  Patient: [name]  Procedure(s) Performed: * No procedures listed *  Patient Location: Mother/Baby  Anesthesia Type:Epidural  Level of Consciousness: awake and alert   Airway and Oxygen Therapy: Patient Spontanous Breathing  Post-op Pain: none  Post-op Assessment: Patient's Cardiovascular Status Stable, Respiratory Function Stable, Patent Airway, No signs of Nausea or vomiting, Adequate PO intake, Pain level controlled, No headache, No backache, No residual numbness and No residual motor weakness  Post-op Vital Signs: Reviewed and stable  Complications: No apparent anesthesia complications

## 2012-12-10 MED ORDER — OXYCODONE-ACETAMINOPHEN 5-325 MG PO TABS: 1.0000 | ORAL_TABLET | ORAL | Status: DC | PRN

## 2012-12-10 MED ORDER — IBUPROFEN 600 MG PO TABS: 600.0000 mg | ORAL_TABLET | Freq: Four times a day (QID) | ORAL | Status: DC | PRN

## 2012-12-10 NOTE — Discharge Summary (Signed)
Vaginal Delivery Discharge Summary  [name]  DOB:    12-19-1990 MRN:    [id number] CSN:    [id number]  Date of admission:                  12/07/12  Date of discharge:                   12/10/12  Procedures this admission:  Date of Delivery: 12/08/12  Newborn Data:  Live born female  Birth Weight: 5 lb 10.7 oz (2570 g) APGAR: 8, 9  D/C anticipated on day of mother's d/c, but baby has lost weight--seems to be breastfeeding well.  Peds will evaluate for d/c on rounds today.  History of Present Illness:  [name] is a 22 y.o. female, [id number], who presents at [redacted]w[redacted]d weeks gestation. The patient has been followed at the [facility] and Gynecology division of [facility]. She was admitted rupture of membranes. Her pregnancy has been complicated by:  Patient Active Problem List   Diagnosis Date Noted  . Chlamydia infection 12/08/2012  . Low maternal weight gain 12/08/2012  . Generalized headaches 12/08/2012  . Smoker 12/08/2012  . 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 Contact with or exposure to venereal diseases 07/17/2011  . Morbid obesity 03/25/2011     Hospital course:  The patient was admitted for SROM.   Her labor was not complicated, and was augmented due to SROM and no significant labor.. She proceeded to have a vaginal delivery of a healthy infant, with Dr. Normand Sloop attending.. Her delivery was not complicated. Her postpartum course was not complicated.  She was discharged to home on postpartum day 2 doing well.  Feeding:  breast  Contraception:  Nexplanon  Discharge hemoglobin:  Hemoglobin  Date Value Range Status  12/09/2012 10.6* 12.0 - 15.0 g/dL Final     HCT  Date Value Range Status  12/09/2012 31.3* 36.0 - 46.0 % Final    Discharge Physical Exam:   General: alert Lochia: appropriate Uterine Fundus: firm Incision: healing well DVT Evaluation: No evidence of DVT seen on  physical exam. Negative Homan's sign.  Intrapartum Procedures: spontaneous vaginal delivery Postpartum Procedures: none Complications-Operative and Postpartum: 1st degree perineal laceration  Discharge Diagnoses: Term Pregnancy-delivered  Discharge Information:  Activity:           Per [facility] handout Diet:                routine Medications: Ibuprofen and Percocet Condition:      stable Instructions:   Postpartum Care After Vaginal Delivery  After you deliver your newborn (postpartum period), the usual stay in the hospital is 24 72 hours. If there were problems with your labor or delivery, or if you have other medical problems, you might be in the hospital longer.  While you are in the hospital, you will receive help and instructions on how to care for yourself and your newborn during the postpartum period.  While you are in the hospital:  Be sure to tell your nurses if you have pain or discomfort, as well as where you feel the pain and what makes the pain worse.  If you had an incision made near your vagina (episiotomy) or if you had some tearing during delivery, the nurses may put ice packs on your episiotomy or tear. The ice packs may help to reduce the pain and swelling.  If you are breastfeeding, you may feel uncomfortable contractions of your  uterus for a couple of weeks. This is normal. The contractions help your uterus get back to normal size.  It is normal to have some bleeding after delivery.  For the first 1 3 days after delivery, the flow is red and the amount may be similar to a period.  It is common for the flow to start and stop.  In the first few days, you may pass some small clots. Let your nurses know if you begin to pass large clots or your flow increases.  Do not  flush blood clots down the toilet before having the nurse look at them.  During the next 3 10 days after delivery, your flow should become more watery and pink or brown-tinged in color.  Ten to  fourteen days after delivery, your flow should be a small amount of yellowish-white discharge.  The amount of your flow will decrease over the first few weeks after delivery. Your flow may stop in 6 8 weeks. Most women have had their flow stop by 12 weeks after delivery.  You should change your sanitary pads frequently.  Wash your hands thoroughly with soap and water for at least 20 seconds after changing pads, using the toilet, or before holding or feeding your newborn.  You should feel like you need to empty your bladder within the first 6 8 hours after delivery.  In case you become weak, lightheaded, or faint, call your nurse before you get out of bed for the first time and before you take a shower for the first time.  Within the first few days after delivery, your breasts may begin to feel tender and full. This is called engorgement. Breast tenderness usually goes away within 48 72 hours after engorgement occurs. You may also notice milk leaking from your breasts. If you are not breastfeeding, do not stimulate your breasts. Breast stimulation can make your breasts produce more milk.  Spending as much time as possible with your newborn is very important. During this time, you and your newborn can feel close and get to know each other. Having your newborn stay in your room (rooming in) will help to strengthen the bond with your newborn. It will give you time to get to know your newborn and become comfortable caring for your newborn.  Your hormones change after delivery. Sometimes the hormone changes can temporarily cause you to feel sad or tearful. These feelings should not last more than a few days. If these feelings last longer than that, you should talk to your caregiver.  If desired, talk to your caregiver about methods of family planning or contraception.  Talk to your caregiver about immunizations. Your caregiver may want you to have the following immunizations before leaving the  hospital:  Tetanus, diphtheria, and pertussis (Tdap) or tetanus and diphtheria (Td) immunization. It is very important that you and your family (including grandparents) or others caring for your newborn are up-to-date with the Tdap or Td immunizations. The Tdap or Td immunization can help protect your newborn from getting ill.  Rubella immunization.  Varicella (chickenpox) immunization.  Influenza immunization. You should receive this annual immunization if you did not receive the immunization during your pregnancy. Document Released: 11/04/2006 Document Revised: 10/02/2011 Document Reviewed: 09/04/2011 [facility] Patient Information 2014 [place], [place].   Postpartum Depression and Baby Blues  The postpartum period begins right after the birth of a baby. During this time, there is often a great amount of joy and excitement. It is also a time of considerable  changes in the life of the parent(s). Regardless of how many times a mother gives birth, each child brings new challenges and dynamics to the family. It is not unusual to have feelings of excitement accompanied by confusing shifts in moods, emotions, and thoughts. All mothers are at risk of developing postpartum depression or the "baby blues." These mood changes can occur right after giving birth, or they may occur many months after giving birth. The baby blues or postpartum depression can be mild or severe. Additionally, postpartum depression can resolve rather quickly, or it can be a long-term condition. CAUSES Elevated hormones and their rapid decline are thought to be a main cause of postpartum depression and the baby blues. There are a number of hormones that radically change during and after pregnancy. Estrogen and progesterone usually decrease immediately after delivering your baby. The level of thyroid hormone and various cortisol steroids also rapidly drop. Other factors that play a major role in these changes include major life events  and genetics.  RISK FACTORS If you have any of the following risks for the baby blues or postpartum depression, know what symptoms to watch out for during the postpartum period. Risk factors that may increase the likelihood of getting the baby blues or postpartum depression include:  Havinga personal or family history of depression.  Having depression while being pregnant.  Having premenstrual or oral contraceptive-associated mood issues.  Having exceptional life stress.  Having marital conflict.  Lacking a social support network.  Having a baby with special needs.  Having health problems such as diabetes. SYMPTOMS Baby blues symptoms include:  Brief fluctuations in mood, such as going from extreme happiness to sadness.  Decreased concentration.  Difficulty sleeping.  Crying spells, tearfulness.  Irritability.  Anxiety. Postpartum depression symptoms typically begin within the first month after giving birth. These symptoms include:  Difficulty sleeping or excessive sleepiness.  Marked weight loss.  Agitation.  Feelings of worthlessness.  Lack of interest in activity or food. Postpartum psychosis is a very concerning condition and can be dangerous. Fortunately, it is rare. Displaying any of the following symptoms is cause for immediate medical attention. Postpartum psychosis symptoms include:  Hallucinations and delusions.  Bizarre or disorganized behavior.  Confusion or disorientation. DIAGNOSIS  A diagnosis is made by an evaluation of your symptoms. There are no medical or lab tests that lead to a diagnosis, but there are various questionnaires that a caregiver may use to identify those with the baby blues, postpartum depression, or psychosis. Often times, a screening tool called the [place] Postnatal Depression Scale is used to diagnose depression in the postpartum period.  TREATMENT The baby blues usually goes away on its own in 1 to 2 weeks. Social support  is often all that is needed. You should be encouraged to get adequate sleep and rest. Occasionally, you may be given medicines to help you sleep.  Postpartum depression requires treatment as it can last several months or longer if it is not treated. Treatment may include individual or group therapy, medicine, or both to address any social, physiological, and psychological factors that may play a role in the depression. Regular exercise, a healthy diet, rest, and social support may also be strongly recommended.  Postpartum psychosis is more serious and needs treatment right away. Hospitalization is often needed. HOME CARE INSTRUCTIONS  Get as much rest as you can. Nap when the baby sleeps.  Exercise regularly. Some women find yoga and walking to be beneficial.  Eat a balanced and  nourishing diet.  Do little things that you enjoy. Have a cup of tea, take a bubble bath, read your favorite magazine, or listen to your favorite music.  Avoid alcohol.  Ask for help with household chores, cooking, grocery shopping, or running errands as needed. Do not try to do everything.  Talk to people close to you about how you are feeling. Get support from your partner, family members, friends, or other new moms.  Try to stay positive in how you think. Think about the things you are grateful for.  Do not spend a lot of time alone.  Only take medicine as directed by your caregiver.  Keep all your postpartum appointments.  Let your caregiver know if you have any concerns. SEEK MEDICAL CARE IF: You are having a reaction or problems with your medicine. SEEK IMMEDIATE MEDICAL CARE IF:  You have suicidal feelings.  You feel you may harm the baby or someone else. Document Released: 10/12/2003 Document Revised: 04/01/2011 Document Reviewed: 11/13/2010 [facility] Patient Information 2014 [place], [place].   Discharge to: home     Nigel Bridgeman 12/10/2012

## 2013-01-27 ENCOUNTER — Emergency Department (HOSPITAL_COMMUNITY)
Admission: EM | Admit: 2013-01-27 | Discharge: 2013-01-27 | Disposition: A | Discharge status: Home / Self Care | Payer: Medicaid Other | Attending: Emergency Medicine | Admitting: Emergency Medicine

## 2013-01-27 ENCOUNTER — Encounter (HOSPITAL_COMMUNITY): Payer: Self-pay | Admitting: Emergency Medicine

## 2013-01-27 DIAGNOSIS — Z8619 Personal history of other infectious and parasitic diseases: Secondary | ICD-10-CM | POA: Insufficient documentation

## 2013-01-27 DIAGNOSIS — J45909 Unspecified asthma, uncomplicated: Secondary | ICD-10-CM | POA: Insufficient documentation

## 2013-01-27 DIAGNOSIS — K5900 Constipation, unspecified: Secondary | ICD-10-CM | POA: Insufficient documentation

## 2013-01-27 DIAGNOSIS — Z87891 Personal history of nicotine dependence: Secondary | ICD-10-CM | POA: Insufficient documentation

## 2013-01-27 DIAGNOSIS — M549 Dorsalgia, unspecified: Secondary | ICD-10-CM | POA: Insufficient documentation

## 2013-01-27 DIAGNOSIS — K625 Hemorrhage of anus and rectum: Secondary | ICD-10-CM

## 2013-01-27 LAB — CBC
HCT: 37.4 % (ref 36.0–46.0)
HEMOGLOBIN: 12.4 g/dL (ref 12.0–15.0)
MCH: 25.3 pg — AB (ref 26.0–34.0)
MCHC: 33.2 g/dL (ref 30.0–36.0)
MCV: 76.2 fL — AB (ref 78.0–100.0)
PLATELETS: 281 10*3/uL (ref 150–400)
RBC: 4.91 MIL/uL (ref 3.87–5.11)
RDW: 16.7 % — ABNORMAL HIGH (ref 11.5–15.5)
WBC: 7.9 10*3/uL (ref 4.0–10.5)

## 2013-01-27 LAB — TYPE AND SCREEN
ABO/RH(D): O POS
Antibody Screen: NEGATIVE

## 2013-01-27 LAB — COMPREHENSIVE METABOLIC PANEL
ALT: 16 U/L (ref 0–35)
AST: 13 U/L (ref 0–37)
Albumin: 3.7 g/dL (ref 3.5–5.2)
Alkaline Phosphatase: 123 U/L — ABNORMAL HIGH (ref 39–117)
BUN: 14 mg/dL (ref 6–23)
CALCIUM: 8.9 mg/dL (ref 8.4–10.5)
CO2: 22 mEq/L (ref 19–32)
CREATININE: 0.73 mg/dL (ref 0.50–1.10)
Chloride: 100 mEq/L (ref 96–112)
GFR calc Af Amer: >90 mL/min (ref >90)
GFR calc non Af Amer: >90 mL/min (ref >90)
GLUCOSE: 97 mg/dL (ref 70–99)
Potassium: 3.9 mEq/L (ref 3.7–5.3)
SODIUM: 135 meq/L — AB (ref 137–147)
TOTAL PROTEIN: 7.3 g/dL (ref 6.0–8.3)
Total Bilirubin: 0.3 mg/dL (ref 0.3–1.2)

## 2013-01-27 LAB — ABO/RH: ABO/RH(D): O POS

## 2013-01-27 LAB — OCCULT BLOOD, POC DEVICE: Fecal Occult Bld: POSITIVE — AB

## 2013-01-27 MED ORDER — POLYETHYLENE GLYCOL 3350 17 G PO PACK: 17.0000 g | PACK | Freq: Every day | ORAL | Status: DC

## 2013-01-27 NOTE — ED Notes (Signed)
Pt states that she has been having rectal bleeding every time that she has a bowel movement.  States that this has been happening since she had a baby in November.  States that the water in the toilet turns bright red during every bowel movement.  States problems w/ constipation but that even when it is soft, she still bleeds.

## 2013-01-27 NOTE — Discharge Instructions (Signed)
Please follow up with GI specialist for further evaluation of your rectal bleeding.  Take miralax to help with your bowel movement.  Return if you have worsen symptoms, having lightheadedness, dizziness or abdominal pain.   Rectal Bleeding Rectal bleeding is when blood passes out of the anus. It is usually a sign that something is wrong. It may not be serious, but it should always be evaluated. Rectal bleeding may present as bright red blood or extremely dark stools. The color may range from dark red or maroon to black (like tar). It is important that the cause of rectal bleeding be identified so treatment can be started and the problem corrected. CAUSES   Hemorrhoids. These are enlarged (dilated) blood vessels or veins in the anal or rectal area.  Fistulas. Theseare abnormal, burrowing channels that usually run from inside the rectum to the skin around the anus. They can bleed.  Anal fissures. This is a tear in the tissue of the anus. Bleeding occurs with bowel movements.  Diverticulosis. This is a condition in which pockets or sacs project from the bowel wall. Occasionally, the sacs can bleed.  Diverticulitis. Thisis an infection involving diverticulosis of the colon.  Proctitis and colitis. These are conditions in which the rectum, colon, or both, can become inflamed and pitted (ulcerated).  Polyps and cancer. Polyps are non-cancerous (benign) growths in the colon that may bleed. Certain types of polyps turn into cancer.  Protrusion of the rectum. Part of the rectum can project from the anus and bleed.  Certain medicines.  Intestinal infections.  Blood vessel abnormalities. HOME CARE INSTRUCTIONS  Eat a high-fiber diet to keep your stool soft.  Limit activity.  Drink enough fluids to keep your urine clear or pale yellow.  Warm baths may be useful to soothe rectal pain.  Follow up with your caregiver as directed. SEEK IMMEDIATE MEDICAL CARE IF:  You develop increased  bleeding.  You have black or dark red stools.  You vomit blood or material that looks like coffee grounds.  You have abdominal pain or tenderness.  You have a fever.  You feel weak, nauseous, or you faint.  You have severe rectal pain or you are unable to have a bowel movement. MAKE SURE YOU:  Understand these instructions.  Will watch your condition.  Will get help right away if you are not doing well or get worse. Document Released: 06/29/2001 Document Revised: 04/01/2011 Document Reviewed: 06/24/2010 [facility] Patient Information 2014 [place], [place].

## 2013-01-27 NOTE — ED Provider Notes (Signed)
CSN: [id number]     Arrival date & time 01/27/13  1428 History   First MD Initiated Contact with Patient 01/27/13 1639     Chief Complaint  Patient presents with  . Rectal Bleeding   (Consider location/radiation/quality/duration/timing/severity/associated sxs/prior Treatment) HPI Patient is a 23 yo AA female presenting today with a complaint of rectal bleeding. Bleeding occurs with a BM every time. Blood apparent in toilet and with wiping, but denies massive/constant bleeding. Unable to quantify the amount but sts she noticed blood in toilet bowl, but does not see any blood dripping down. Denies change in stool color.  Denies rectal pain or anal pruritus.  Has BM q 2-3 days and states that is normal. Bleeding began after vaginal delivery of second child on Nov. 18, 2014. Patient states bleeding also occurred after birth of first child one year ago. Current bleeding similar to episodes after delivery of first child. Report family hx of hemorrhoids.  Denies family history of coagulopathy or cancer. Sexually active with one partner, denies recent protection or birth control. Seen by OB two days ago and prescribed birth control, which she is currently taking.  Had a pregnancy test 2 days ago and was negative. Has not had period in about 2 months. Denies fever, headache, SOB, chest pain, abdominal pain.  Denies lightheadedness or dizziness. Denies recent medication or diet changes. Pt was recommended by family member to have rectal bleeding evaluated due to the chronicity of sxs.   Smokes roughly 2 cigarettes a day for past three years, occasional ETOH consumption, denies drug use.   Past Medical History  Diagnosis Date  . Asthma   . Gonorrhea 2011  . Chlamydia 2011   Past Surgical History  Procedure Laterality Date  . Knee surgery  2007   Family History  Problem Relation Age of Onset  . Anesthesia problems Neg Hx   . Diabetes Maternal Grandmother   . Hypertension Maternal Grandmother     History  Substance Use Topics  . Smoking status: Former Smoker -- 0.50 packs/day for 5 years    Types: Cigarettes    Quit date: 03/18/2012  . Smokeless tobacco: Never Used  . Alcohol Use: No     Comment: Quit: 03/17/12   OB History   Grav Para Term Preterm Abortions TAB SAB Ect Mult Living   3 2 2  0 1 0 1 0 0 2     Review of Systems  Constitutional: Negative for fever.  Gastrointestinal: Positive for constipation and blood in stool. Negative for abdominal pain and rectal pain.  Musculoskeletal: Positive for back pain.  Skin: Negative for rash and wound.  All other systems reviewed and are negative.    Allergies  Review of patient's allergies indicates no known allergies.  Home Medications  No current outpatient prescriptions on file. BP 118/66  Pulse 94  Temp(Src) 98.5 F (36.9 C) (Oral)  Resp 12  SpO2 97% Physical Exam  Nursing note and vitals reviewed. Constitutional: She appears well-developed and well-nourished. No distress.  HENT:  Head: Atraumatic.  Eyes: Conjunctivae are normal.  Neck: Neck supple.  Cardiovascular: Normal rate and regular rhythm.   Pulmonary/Chest: Effort normal and breath sounds normal.  Abdominal: There is no tenderness.  Genitourinary:  Chaperone present:  Normal rectal tone, no external hemorrhoids, possible internal hemorrhoid with DRE.  NO mass, no anal fissure, no thrombosed hemorrhoids, minimal tenderness on exam, no rash. Trace of BRBPR noted on glove, hemoccult positive. No melena  Neurological: She is alert.  Skin: No rash noted.  Psychiatric: She has a normal mood and affect.    ED Course  Procedures (including critical care time)  5:46 PM Pt with recurrent BRBPR with each BM since birth of her child.  She is hemodynamically stable.  No obvious external hemorrhoids on exam but suspect bleeding from internal hemorrhoids.  Low suspicion for CA, IBD, rectal abscess, AVM, bowel ischemia, coagulopathy, or anal fissure.  Her  Hgb 12.4 and normal.  No significant abd pain on exam.  Plan to recommend miralax as stool softener, and to have pt f/u with GI specialist for further management.  Pt voice understanding and agrees with plan.    Labs Review Labs Reviewed  CBC - Abnormal; Notable for the following:    MCV 76.2 (*)    MCH 25.3 (*)    RDW 16.7 (*)    All other components within normal limits  COMPREHENSIVE METABOLIC PANEL - Abnormal; Notable for the following:    Sodium 135 (*)    Alkaline Phosphatase 123 (*)    All other components within normal limits  OCCULT BLOOD, POC DEVICE - Abnormal; Notable for the following:    Fecal Occult Bld POSITIVE (*)    All other components within normal limits  TYPE AND SCREEN  ABO/RH   Imaging Review No results found.  EKG Interpretation   None       MDM   1. BRBPR (bright red blood per rectum)    BP 118/66  Pulse 94  Temp(Src) 98.5 F (36.9 C) (Oral)  Resp 12  SpO2 97%  I have reviewed nursing notes and vital signs.  I reviewed available ER/hospitalization records thought the EMR     Fayrene HelperBowie Royalti Schauf, [place] JerseyPA-C 01/27/13 1819

## 2013-01-27 NOTE — ED Provider Notes (Signed)
Medical screening examination/treatment/procedure(s) were performed by non-physician practitioner and as supervising physician I was immediately available for consultation/collaboration.    Celene KrasJon R Luisdavid Hamblin, MD 01/27/13 [phone]

## 2013-02-16 ENCOUNTER — Telehealth: Payer: Self-pay | Admitting: Nurse Practitioner

## 2013-02-16 ENCOUNTER — Ambulatory Visit: Payer: Medicaid Other | Admitting: Nurse Practitioner

## 2013-02-16 NOTE — Telephone Encounter (Signed)
Patient was no show for office visit today. 

## 2013-09-08 IMAGING — US US OB FOLLOW-UP
2 series · 14 of 28 positions shown · non-contrast
Comparison: none

[Series 1: us ob follow-up · 1 of 6 slices shown (1 of 2)]
[im 3/6]
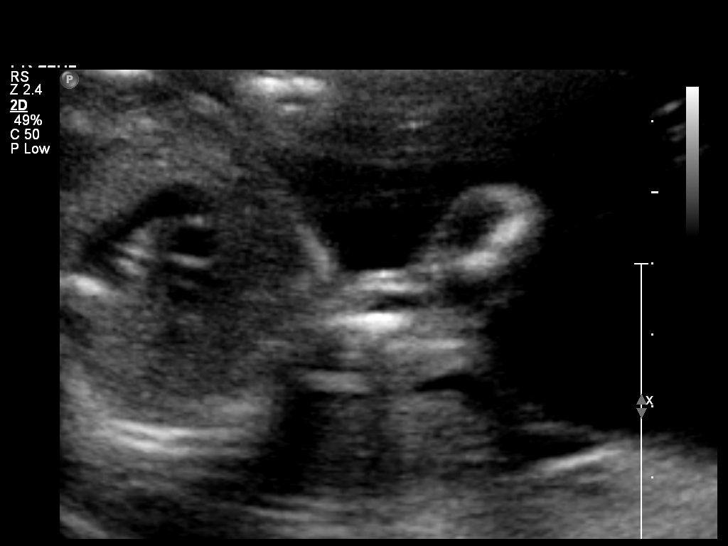

[Series 1: us ob follow-up · 57 acquisitions, 13 frames shown (2 of 2)]
[im 1/57]
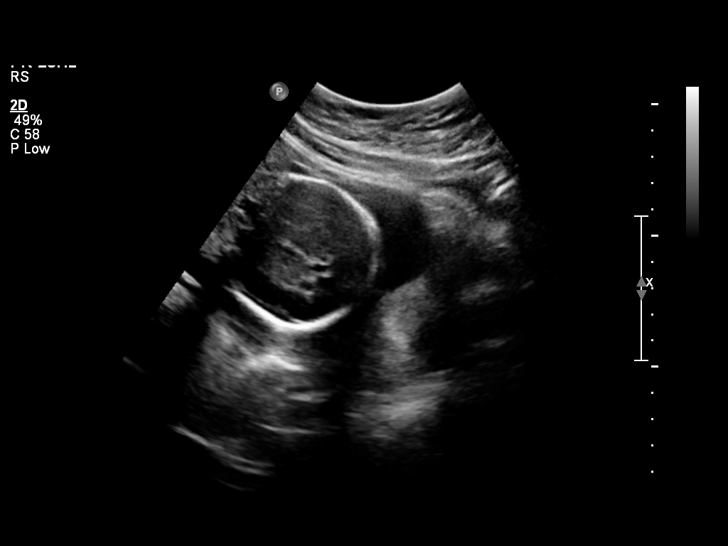
[im 5/57]
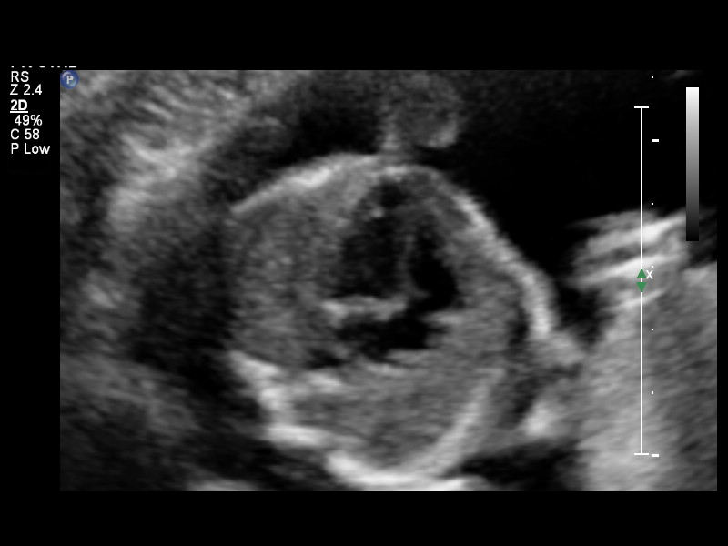
[im 10/57]
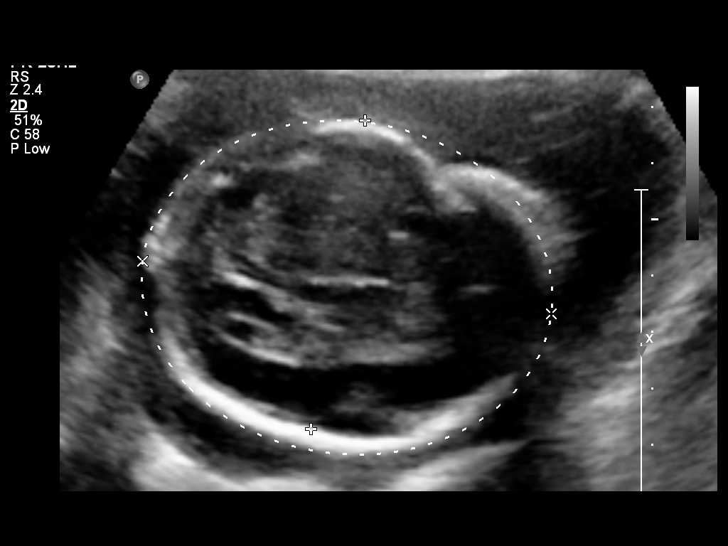
[im 15/57]
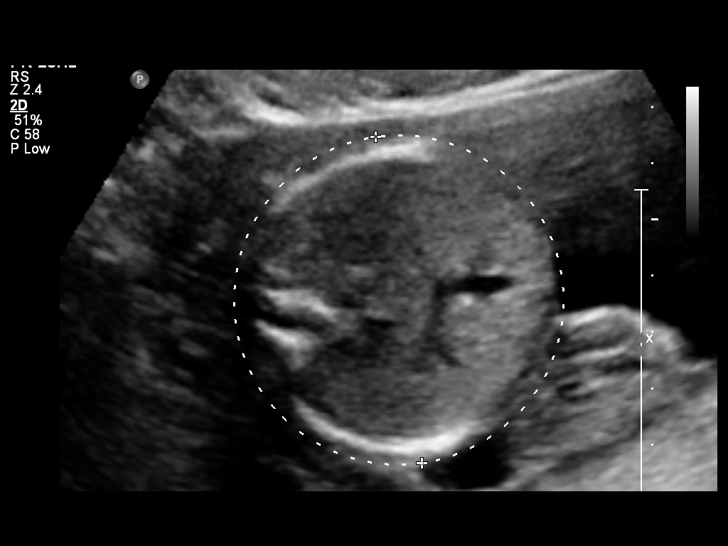
[im 19/57]
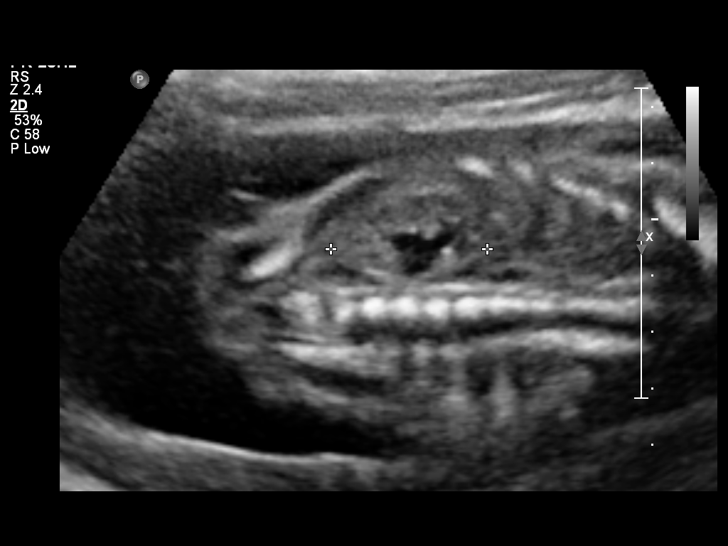
[im 24/57]
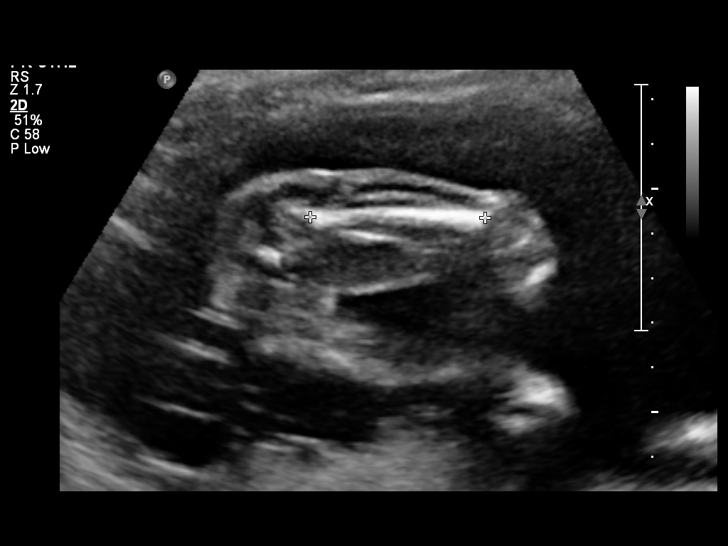
[im 29/57]
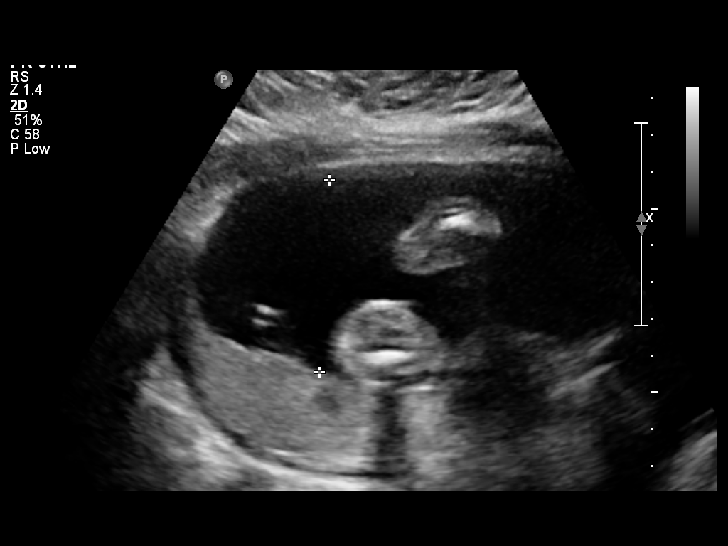
[im 33/57]
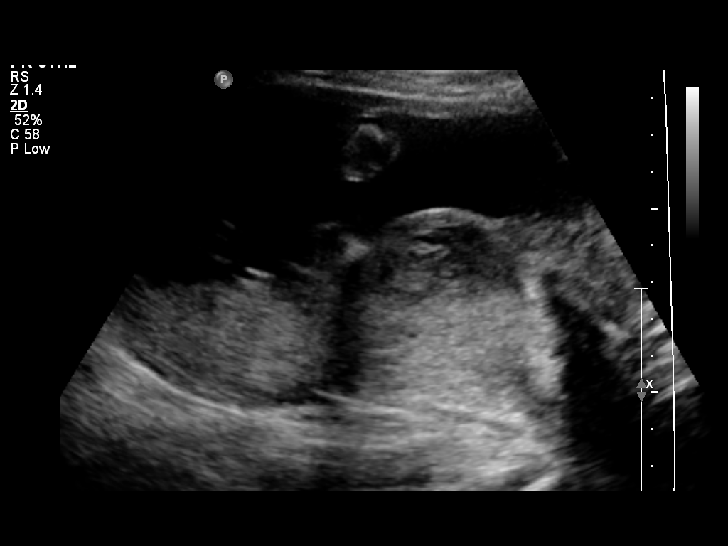
[im 38/57]
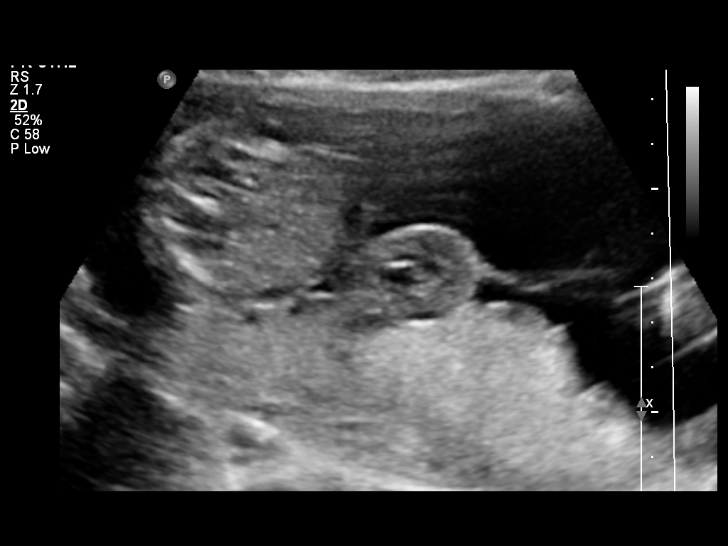
[im 43/57]
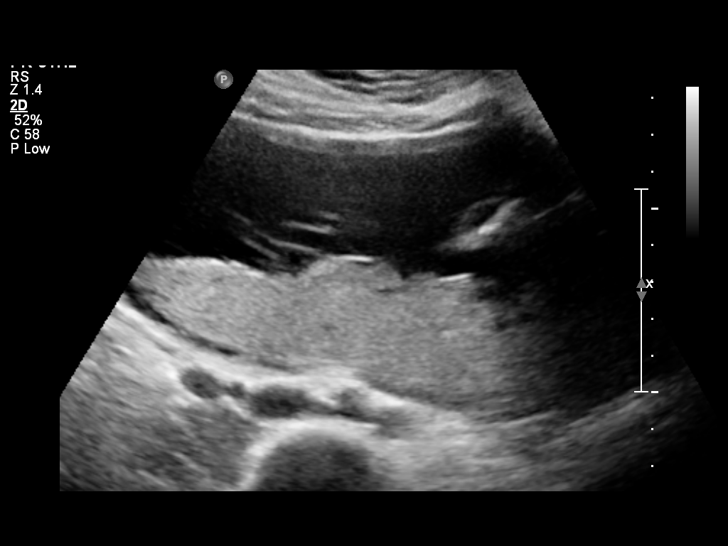
[im 47/57]
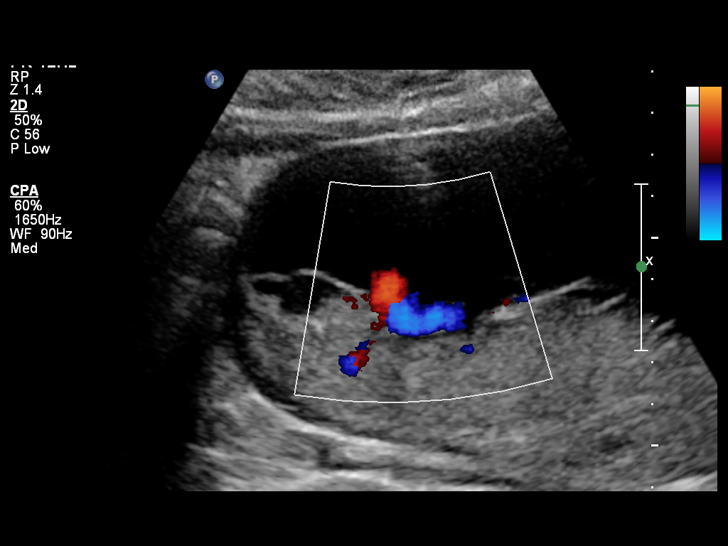
[im 52/57]
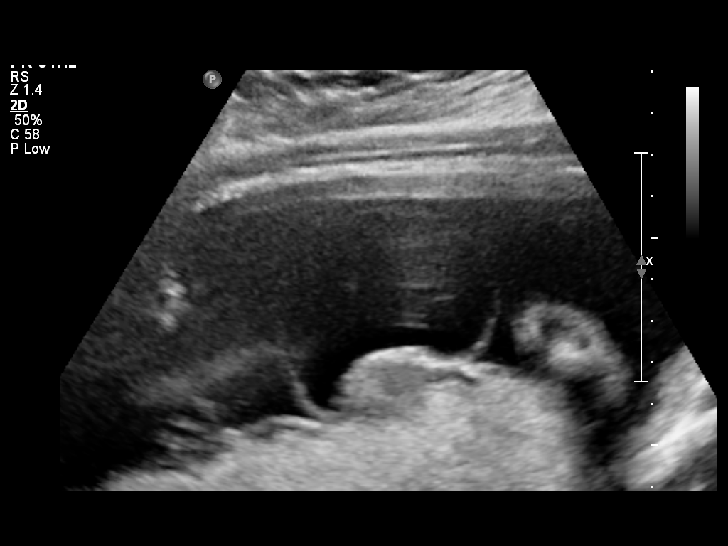
[im 57/57]
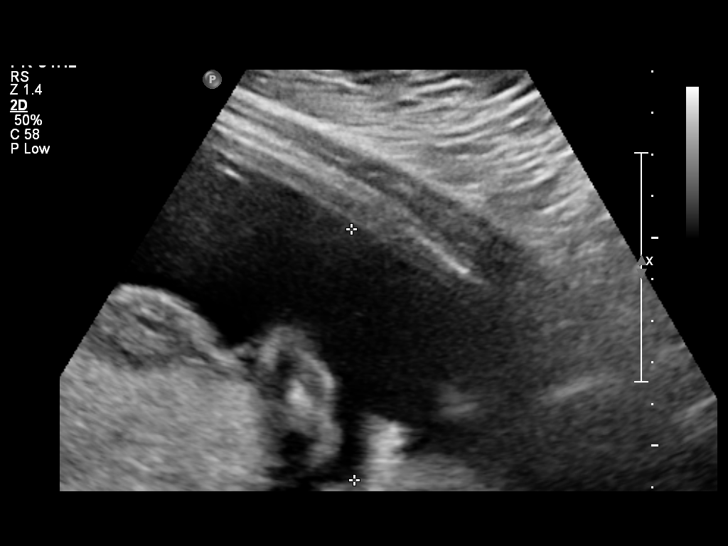

[14 of 28 positions shown; findings below may reference images not displayed]

Canned report from images found in remote index.

Refer to host system for actual result text.

## 2013-11-22 ENCOUNTER — Encounter (HOSPITAL_COMMUNITY): Payer: Self-pay | Admitting: Emergency Medicine

## 2013-11-22 ENCOUNTER — Inpatient Hospital Stay (HOSPITAL_COMMUNITY)
Admission: AD | Admit: 2013-11-22 | Discharge: 2013-11-22 | Disposition: A | Discharge status: Home / Self Care | Payer: Medicaid Other | Source: Ambulatory Visit | Attending: Obstetrics and Gynecology | Admitting: Obstetrics and Gynecology

## 2013-11-22 DIAGNOSIS — Z87891 Personal history of nicotine dependence: Secondary | ICD-10-CM | POA: Insufficient documentation

## 2013-11-22 DIAGNOSIS — Z793 Long term (current) use of hormonal contraceptives: Secondary | ICD-10-CM | POA: Diagnosis not present

## 2013-11-22 DIAGNOSIS — Z3201 Encounter for pregnancy test, result positive: Secondary | ICD-10-CM | POA: Diagnosis not present

## 2013-11-22 LAB — URINALYSIS, ROUTINE W REFLEX MICROSCOPIC
BILIRUBIN URINE: NEGATIVE
Glucose, UA: NEGATIVE mg/dL
Hgb urine dipstick: NEGATIVE
Ketones, ur: NEGATIVE mg/dL
Leukocytes, UA: NEGATIVE
Nitrite: NEGATIVE
PROTEIN: NEGATIVE mg/dL
Specific Gravity, Urine: 1.025 (ref 1.005–1.030)
Urobilinogen, UA: 0.2 mg/dL (ref 0.0–1.0)
pH: 6 (ref 5.0–8.0)

## 2013-11-22 LAB — HCG, QUANTITATIVE, PREGNANCY: hCG, Beta Chain, Quant, S: <1 m[IU]/mL (ref <5)

## 2013-11-22 NOTE — Discharge Instructions (Signed)

## 2013-11-22 NOTE — MAU Provider Note (Signed)
History    Jerrilyn CairoDeshontala Letourneau is a 23y.o. [id number] who presents, unannounced, after positive pregnancy test.  Patient reports that she was feeling "crazy yesterday," aeb increased fatigue, lightlessness, and nausea with dry heaving.  Patient states that she took a pregnancy test that evening and it initially appeared negative with one line.  However, patient states that upon waking she noted that the test now had 2 lines and decided to come to MAU after work.  Patient currently with nexplanon implant for BCM. Patient denies other issues at current.   Patient Active Problem List   Diagnosis Date Noted  . Vaginal delivery 12/10/2012  . Chlamydia infection 12/08/2012  . Low maternal weight gain 12/08/2012  . Generalized headaches 12/08/2012  . Smoker 12/08/2012  . 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 Contact with or exposure to venereal diseases 07/17/2011  . Morbid obesity 03/25/2011    Chief Complaint  Patient presents with  . Possible Pregnancy   HPI  OB History    Gravida Para Term Preterm AB TAB SAB Ectopic Multiple Living   3 2 2  0 1 0 1 0 0 2      Past Medical History  Diagnosis Date  . Asthma   . Gonorrhea 2011  . Chlamydia 2011    Past Surgical History  Procedure Laterality Date  . Knee surgery  2007    Family History  Problem Relation Age of Onset  . Anesthesia problems Neg Hx   . Diabetes Maternal Grandmother   . Hypertension Maternal Grandmother     History  Substance Use Topics  . Smoking status: Former Smoker -- 0.50 packs/day for 5 years    Types: Cigarettes    Quit date: 03/18/2012  . Smokeless tobacco: Never Used  . Alcohol Use: No     Comment: Quit: 03/17/12    Allergies: No Known Allergies  Prescriptions prior to admission  Medication Sig Dispense Refill Last Dose  . polyethylene glycol (MIRALAX / GLYCOLAX) packet Take 17 g by mouth daily. 14 each 0     ROS  See HPI Above Physical Exam   Blood pressure 148/81, pulse  48. Results for orders placed or performed during the hospital encounter of 11/22/13 (from the past 24 hour(s))  Urinalysis, Routine w reflex microscopic     Status: None   Collection Time: 11/22/13  3:00 PM  Result Value Ref Range   Color, Urine YELLOW YELLOW   APPearance CLEAR CLEAR   Specific Gravity, Urine 1.025 1.005 - 1.030   pH 6.0 5.0 - 8.0   Glucose, UA NEGATIVE NEGATIVE mg/dL   Hgb urine dipstick NEGATIVE NEGATIVE   Bilirubin Urine NEGATIVE NEGATIVE   Ketones, ur NEGATIVE NEGATIVE mg/dL   Protein, ur NEGATIVE NEGATIVE mg/dL   Urobilinogen, UA 0.2 0.0 - 1.0 mg/dL   Nitrite NEGATIVE NEGATIVE   Leukocytes, UA NEGATIVE NEGATIVE  hCG, quantitative, pregnancy     Status: None   Collection Time: 11/22/13  3:42 PM  Result Value Ref Range   hCG, Beta Chain, Quant, S <1 <5 mIU/mL    Physical Exam  Constitutional: She is oriented to person, place, and time. She appears well-developed and well-nourished.  Cardiovascular: Normal rate, regular rhythm and normal heart sounds.   Respiratory: Effort normal and breath sounds normal.  GI: Soft.  Genitourinary:  Deferred   Neurological: She is alert and oriented to person, place, and time.  Skin:     Nexplanon palpated in left arm, intact.  Psychiatric: Her mood  appears anxious.    ED Course  Assessment: 23 y.o Female Positive UPT Nexplanon Implantation Device  Plan: -UA -b-hcG  Follow Up (1700) -UA negative -Beta-<1 -Make appt for yearly exam -Encouraged to call if any questions or concerns arise prior to next scheduled office visit.   Laren BoomMLY, Jordon Kristiansen LYNN CNM, MSN 11/22/2013 3:34 PM

## 2013-11-22 NOTE — MAU Note (Signed)
Pt presents to MAU with complaints of + HPT last night, states that she did not have her menstrual cycle last month, pt states she is on birthcontrol

## 2014-05-01 ENCOUNTER — Emergency Department (HOSPITAL_COMMUNITY)
Admission: EM | Admit: 2014-05-01 | Discharge: 2014-05-01 | Disposition: A | Discharge status: Home / Self Care | Payer: Medicaid Other | Attending: Emergency Medicine | Admitting: Emergency Medicine

## 2014-05-01 ENCOUNTER — Encounter (HOSPITAL_COMMUNITY): Payer: Self-pay | Admitting: Emergency Medicine

## 2014-05-01 DIAGNOSIS — R110 Nausea: Secondary | ICD-10-CM | POA: Diagnosis not present

## 2014-05-01 DIAGNOSIS — Z720 Tobacco use: Secondary | ICD-10-CM | POA: Insufficient documentation

## 2014-05-01 DIAGNOSIS — Z3202 Encounter for pregnancy test, result negative: Secondary | ICD-10-CM | POA: Insufficient documentation

## 2014-05-01 DIAGNOSIS — R102 Pelvic and perineal pain: Secondary | ICD-10-CM | POA: Insufficient documentation

## 2014-05-01 DIAGNOSIS — J45909 Unspecified asthma, uncomplicated: Secondary | ICD-10-CM | POA: Diagnosis not present

## 2014-05-01 DIAGNOSIS — R1032 Left lower quadrant pain: Secondary | ICD-10-CM | POA: Diagnosis present

## 2014-05-01 DIAGNOSIS — Z8619 Personal history of other infectious and parasitic diseases: Secondary | ICD-10-CM | POA: Insufficient documentation

## 2014-05-01 LAB — CBC WITH DIFFERENTIAL/PLATELET
BASOS ABS: 0 10*3/uL (ref 0.0–0.1)
Basophils Relative: 0 % (ref 0–1)
Eosinophils Absolute: 0.3 10*3/uL (ref 0.0–0.7)
Eosinophils Relative: 2 % (ref 0–5)
HEMATOCRIT: 39.4 % (ref 36.0–46.0)
HEMOGLOBIN: 12.7 g/dL (ref 12.0–15.0)
LYMPHS PCT: 41 % (ref 12–46)
Lymphs Abs: 4.9 10*3/uL — ABNORMAL HIGH (ref 0.7–4.0)
MCH: 26.3 pg (ref 26.0–34.0)
MCHC: 32.2 g/dL (ref 30.0–36.0)
MCV: 81.6 fL (ref 78.0–100.0)
MONOS PCT: 5 % (ref 3–12)
Monocytes Absolute: 0.7 10*3/uL (ref 0.1–1.0)
Neutro Abs: 6.3 10*3/uL (ref 1.7–7.7)
Neutrophils Relative %: 52 % (ref 43–77)
Platelets: 291 10*3/uL (ref 150–400)
RBC: 4.83 MIL/uL (ref 3.87–5.11)
RDW: 15 % (ref 11.5–15.5)
WBC: 12.1 10*3/uL — ABNORMAL HIGH (ref 4.0–10.5)

## 2014-05-01 LAB — WET PREP, GENITAL
CLUE CELLS WET PREP: NONE SEEN
Trich, Wet Prep: NONE SEEN
YEAST WET PREP: NONE SEEN

## 2014-05-01 LAB — COMPREHENSIVE METABOLIC PANEL
ALT: 14 U/L (ref 0–35)
ANION GAP: 6 (ref 5–15)
AST: 15 U/L (ref 0–37)
Albumin: 3.3 g/dL — ABNORMAL LOW (ref 3.5–5.2)
Alkaline Phosphatase: 72 U/L (ref 39–117)
BUN: 15 mg/dL (ref 6–23)
CALCIUM: 8.6 mg/dL (ref 8.4–10.5)
CO2: 25 mmol/L (ref 19–32)
Chloride: 108 mmol/L (ref 96–112)
Creatinine, Ser: 0.68 mg/dL (ref 0.50–1.10)
GFR calc Af Amer: >90 mL/min (ref >90)
Glucose, Bld: 88 mg/dL (ref 70–99)
POTASSIUM: 3.8 mmol/L (ref 3.5–5.1)
Sodium: 139 mmol/L (ref 135–145)
TOTAL PROTEIN: 6.3 g/dL (ref 6.0–8.3)
Total Bilirubin: 0.3 mg/dL (ref 0.3–1.2)

## 2014-05-01 LAB — URINALYSIS, ROUTINE W REFLEX MICROSCOPIC
Bilirubin Urine: NEGATIVE
Glucose, UA: NEGATIVE mg/dL
Hgb urine dipstick: NEGATIVE
Ketones, ur: NEGATIVE mg/dL
Leukocytes, UA: NEGATIVE
NITRITE: NEGATIVE
PH: 7 (ref 5.0–8.0)
Protein, ur: NEGATIVE mg/dL
Specific Gravity, Urine: 1.025 (ref 1.005–1.030)
Urobilinogen, UA: 1 mg/dL (ref 0.0–1.0)

## 2014-05-01 LAB — LIPASE, BLOOD: Lipase: 26 U/L (ref 11–59)

## 2014-05-01 LAB — POC URINE PREG, ED: Preg Test, Ur: NEGATIVE

## 2014-05-01 MED ORDER — CEFTRIAXONE SODIUM 250 MG IJ SOLR
250.0000 mg | Freq: Once | INTRAMUSCULAR | Status: AC
  Administered 2014-05-01: 250 mg via INTRAMUSCULAR
  Filled 2014-05-01: qty 250

## 2014-05-01 MED ORDER — DOXYCYCLINE HYCLATE 100 MG PO TABS
100.0000 mg | ORAL_TABLET | Freq: Once | ORAL | Status: AC
  Administered 2014-05-01: 100 mg via ORAL
  Filled 2014-05-01: qty 1

## 2014-05-01 MED ORDER — DOXYCYCLINE HYCLATE 100 MG PO CAPS: 100.0000 mg | ORAL_CAPSULE | Freq: Two times a day (BID) | ORAL | Status: DC

## 2014-05-01 NOTE — ED Notes (Signed)
Pelvic cart is set up and NT and EDP in room with pt

## 2014-05-01 NOTE — ED Provider Notes (Signed)
CSN: [id number]     Arrival date & time 05/01/14  1315 History   First MD Initiated Contact with Patient 05/01/14 1351     Chief Complaint  Patient presents with  . Abdominal Pain     (Consider location/radiation/quality/duration/timing/severity/associated sxs/prior Treatment) HPI Comments: Patient presents with lower abdominal pain. She states it's been going on for about 2 weeks intermittently but seems to be getting worse. She describes as an achy crampy pain to her lower abdomen more on the left side. She denies any vaginal bleeding or discharge. She is sexually active and has a past history of gonorrhea and chlamydia. She denies any urinary symptoms. She's had some nausea but no vomiting. She states she can't member when her last period was that she tends to have irregular periods. She denies any fevers or chills.  Patient is a 24 y.o. female presenting with abdominal pain.  Abdominal Pain Associated symptoms: nausea   Associated symptoms: no chest pain, no chills, no cough, no diarrhea, no fatigue, no fever, no hematuria, no shortness of breath and no vomiting     Past Medical History  Diagnosis Date  . Asthma   . Gonorrhea 2011  . Chlamydia 2011   Past Surgical History  Procedure Laterality Date  . Knee surgery  2007   Family History  Problem Relation Age of Onset  . Anesthesia problems Neg Hx   . Diabetes Maternal Grandmother   . Hypertension Maternal Grandmother    History  Substance Use Topics  . Smoking status: Current Every Day Smoker -- 0.50 packs/day for 5 years    Types: Cigarettes  . Smokeless tobacco: Never Used  . Alcohol Use: No     Comment: Quit: 03/17/12   OB History    Gravida Para Term Preterm AB TAB SAB Ectopic Multiple Living   0 1 0 1 0 0 2     Review of Systems  Constitutional: Negative for fever, chills, diaphoresis and fatigue.  HENT: Negative for congestion, rhinorrhea and sneezing.   Eyes: Negative.   Respiratory: Negative for  cough, chest tightness and shortness of breath.   Cardiovascular: Negative for chest pain and leg swelling.  Gastrointestinal: Positive for nausea and abdominal pain. Negative for vomiting, diarrhea and blood in stool.  Genitourinary: Negative for frequency, hematuria, flank pain and difficulty urinating.  Musculoskeletal: Negative for back pain and arthralgias.  Skin: Negative for rash.  Neurological: Negative for dizziness, speech difficulty, weakness, numbness and headaches.      Allergies  Review of patient's allergies indicates no known allergies.  Home Medications   Prior to Admission medications   Medication Sig Start Date End Date Taking? Authorizing Provider  doxycycline (VIBRAMYCIN) 100 MG capsule Take 1 capsule (100 mg total) by mouth 2 (two) times daily. One po bid x 10 days 05/01/14   Rolan Bucco, MD   BP 121/51 mmHg  Pulse 57  Temp(Src) 98.4 F (36.9 C) (Oral)  Resp 20  SpO2 100% Physical Exam  Constitutional: She is oriented to person, place, and time. She appears well-developed and well-nourished.  HENT:  Head: Normocephalic and atraumatic.  Eyes: Pupils are equal, round, and reactive to light.  Neck: Normal range of motion. Neck supple.  Cardiovascular: Normal rate, regular rhythm and normal heart sounds.   Pulmonary/Chest: Effort normal and breath sounds normal. No respiratory distress. She has no wheezes. She has no rales. She exhibits no tenderness.  Abdominal: Soft. Bowel sounds are normal. There is tenderness (Mild tenderness  to suprapubic and left lower abdomen). There is no rebound and no guarding.  Genitourinary:  Positive white gray discharge. No cervical motion tenderness there is some mild tenderness to the left adnexa  Musculoskeletal: Normal range of motion. She exhibits no edema.  Lymphadenopathy:    She has no cervical adenopathy.  Neurological: She is alert and oriented to person, place, and time.  Skin: Skin is warm and dry. No rash noted.   Psychiatric: She has a normal mood and affect.    ED Course  Procedures (including critical care time) Labs Review Results for orders placed or performed during the hospital encounter of 05/01/14  Wet prep, genital  Result Value Ref Range   Yeast Wet Prep HPF POC NONE SEEN NONE SEEN   Trich, Wet Prep NONE SEEN NONE SEEN   Clue Cells Wet Prep HPF POC NONE SEEN NONE SEEN   WBC, Wet Prep HPF POC FEW (A) NONE SEEN  Comprehensive metabolic panel  Result Value Ref Range   Sodium 139 135 - 145 mmol/L   Potassium 3.8 3.5 - 5.1 mmol/L   Chloride 108 96 - 112 mmol/L   CO2 25 19 - 32 mmol/L   Glucose, Bld 88 70 - 99 mg/dL   BUN 15 6 - 23 mg/dL   Creatinine, Ser 1.61 0.50 - 1.10 mg/dL   Calcium 8.6 8.4 - 09.6 mg/dL   Total Protein 6.3 6.0 - 8.3 g/dL   Albumin 3.3 (L) 3.5 - 5.2 g/dL   AST 15 0 - 37 U/L   ALT 14 0 - 35 U/L   Alkaline Phosphatase 72 39 - 117 U/L   Total Bilirubin 0.3 0.3 - 1.2 mg/dL   GFR calc non Af Amer >90 >90 mL/min   GFR calc Af Amer >90 >90 mL/min   Anion gap 6 5 - 15  CBC with Differential  Result Value Ref Range   WBC 12.1 (H) 4.0 - 10.5 K/uL   RBC 4.83 3.87 - 5.11 MIL/uL   Hemoglobin 12.7 12.0 - 15.0 g/dL   HCT 04.5 40.9 - 81.1 %   MCV 81.6 78.0 - 100.0 fL   MCH 26.3 26.0 - 34.0 pg   MCHC 32.2 30.0 - 36.0 g/dL   RDW 91.4 78.2 - 95.6 %   Platelets 291 150 - 400 K/uL   Neutrophils Relative % 52 43 - 77 %   Neutro Abs 6.3 1.7 - 7.7 K/uL   Lymphocytes Relative 41 12 - 46 %   Lymphs Abs 4.9 (H) 0.7 - 4.0 K/uL   Monocytes Relative 5 3 - 12 %   Monocytes Absolute 0.7 0.1 - 1.0 K/uL   Eosinophils Relative 2 0 - 5 %   Eosinophils Absolute 0.3 0.0 - 0.7 K/uL   Basophils Relative 0 0 - 1 %   Basophils Absolute 0.0 0.0 - 0.1 K/uL  Lipase, blood  Result Value Ref Range   Lipase 26 11 - 59 U/L  Urinalysis, Routine w reflex microscopic  Result Value Ref Range   Color, Urine YELLOW YELLOW   APPearance CLEAR CLEAR   Specific Gravity, Urine 1.025 1.005 - 1.030    pH 7.0 5.0 - 8.0   Glucose, UA NEGATIVE NEGATIVE mg/dL   Hgb urine dipstick NEGATIVE NEGATIVE   Bilirubin Urine NEGATIVE NEGATIVE   Ketones, ur NEGATIVE NEGATIVE mg/dL   Protein, ur NEGATIVE NEGATIVE mg/dL   Urobilinogen, UA 1.0 0.0 - 1.0 mg/dL   Nitrite NEGATIVE NEGATIVE   Leukocytes, UA NEGATIVE NEGATIVE  POC Urine Pregnancy, ED  (If Pre-menopausal female) - [facility]  Result Value Ref Range   Preg Test, Ur NEGATIVE NEGATIVE   No results found.    Imaging Review No results found.   EKG Interpretation None      MDM   Final diagnoses:  Pelvic pain in female    Patient presents with pelvic pain and vaginal discharge. She has no cervical motion tenderness or suggestions of tubo-ovarian affect. She is nontoxic appearing. Her urinalysis is negative and her pregnancy test is negative. Her wet prep was negative. I will go ahead and treat her for a possible pelvic infection with Rocephin and doxycycline. She was discharged home in good condition advised to follow-up with the women's outpatient clinic if her symptoms are not improving.    Rolan BuccoMelanie Romell Wolden, MD 05/01/14 [phone]

## 2014-05-01 NOTE — ED Notes (Signed)
Pt c/o lower abd pain x 2 weeks. Pt states that nothing makes pain worse or better.

## 2014-05-01 NOTE — Discharge Instructions (Signed)
Pelvic Pain Female pelvic pain can be caused by many different things and start from a variety of places. Pelvic pain refers to pain that is located in the lower half of the abdomen and between your hips. The pain may occur over a short period of time (acute) or may be reoccurring (chronic). The cause of pelvic pain may be related to disorders affecting the female reproductive organs (gynecologic), but it may also be related to the bladder, kidney stones, an intestinal complication, or muscle or skeletal problems. Getting help right away for pelvic pain is important, especially if there has been severe, sharp, or a sudden onset of unusual pain. It is also important to get help right away because some types of pelvic pain can be life threatening.  CAUSES  Below are only some of the causes of pelvic pain. The causes of pelvic pain can be in one of several categories.   Gynecologic.  Pelvic inflammatory disease.  Sexually transmitted infection.  Ovarian cyst or a twisted ovarian ligament (ovarian torsion).  Uterine lining that grows outside the uterus (endometriosis).  Fibroids, cysts, or tumors.  Ovulation.  Pregnancy.  Pregnancy that occurs outside the uterus (ectopic pregnancy).  Miscarriage.  Labor.  Abruption of the placenta or ruptured uterus.  Infection.  Uterine infection (endometritis).  Bladder infection.  Diverticulitis.  Miscarriage related to a uterine infection (septic abortion).  Bladder.  Inflammation of the bladder (cystitis).  Kidney stone(s).  Gastrointestinal.  Constipation.  Diverticulitis.  Neurologic.  Trauma.  Feeling pelvic pain because of mental or emotional causes (psychosomatic).  Cancers of the bowel or pelvis. EVALUATION  Your caregiver will want to take a careful history of your concerns. This includes recent changes in your health, a careful gynecologic history of your periods (menses), and a sexual history. Obtaining your family  history and medical history is also important. Your caregiver may suggest a pelvic exam. A pelvic exam will help identify the location and severity of the pain. It also helps in the evaluation of which organ system may be involved. In order to identify the cause of the pelvic pain and be properly treated, your caregiver may order tests. These tests may include:   A pregnancy test.  Pelvic ultrasonography.  An X-ray exam of the abdomen.  A urinalysis or evaluation of vaginal discharge.  Blood tests. HOME CARE INSTRUCTIONS   Only take over-the-counter or prescription medicines for pain, discomfort, or fever as directed by your caregiver.   Rest as directed by your caregiver.   Eat a balanced diet.   Drink enough fluids to make your urine clear or pale yellow, or as directed.   Avoid sexual intercourse if it causes pain.   Apply warm or cold compresses to the lower abdomen depending on which one helps the pain.   Avoid stressful situations.   Keep a journal of your pelvic pain. Write down when it started, where the pain is located, and if there are things that seem to be associated with the pain, such as food or your menstrual cycle.  Follow up with your caregiver as directed.  SEEK MEDICAL CARE IF:  Your medicine does not help your pain.  You have abnormal vaginal discharge. SEEK IMMEDIATE MEDICAL CARE IF:   You have heavy bleeding from the vagina.   Your pelvic pain increases.   You feel light-headed or faint.   You have chills.   You have pain with urination or blood in your urine.   You have uncontrolled diarrhea   or vomiting.   You have a fever or persistent symptoms for more than 3 days.  You have a fever and your symptoms suddenly get worse.   You are being physically or sexually abused.  MAKE SURE YOU:  Understand these instructions.  Will watch your condition.  Will get help if you are not doing well or get worse. Document Released:  12/05/2003 Document Revised: 05/24/2013 Document Reviewed: 04/29/2011 Exit[employer] Patient Information 2015 [employer]. This information is not intended to replace advice given to you by your health care provider. Make sure you discuss any questions you have with your health care provider.  

## 2014-05-02 LAB — GC/CHLAMYDIA PROBE AMP (~~LOC~~) NOT AT ARMC
Chlamydia: NEGATIVE
NEISSERIA GONORRHEA: NEGATIVE

## 2014-06-23 ENCOUNTER — Ambulatory Visit (INDEPENDENT_AMBULATORY_CARE_PROVIDER_SITE_OTHER): Payer: Medicaid Other | Admitting: Obstetrics & Gynecology

## 2014-06-23 ENCOUNTER — Encounter: Payer: Self-pay | Admitting: Obstetrics & Gynecology

## 2014-06-23 VITALS — BP 112/65 | HR 96 | Ht 66.0 in | Wt 296.0 lb

## 2014-06-23 DIAGNOSIS — Z30014 Encounter for initial prescription of intrauterine contraceptive device: Secondary | ICD-10-CM

## 2014-06-23 DIAGNOSIS — Z30430 Encounter for insertion of intrauterine contraceptive device: Secondary | ICD-10-CM | POA: Diagnosis not present

## 2014-06-23 DIAGNOSIS — Z3049 Encounter for surveillance of other contraceptives: Secondary | ICD-10-CM | POA: Diagnosis not present

## 2014-06-23 MED ORDER — LEVONORGESTREL 18.6 MCG/DAY IU IUD
INTRAUTERINE_SYSTEM | Freq: Once | INTRAUTERINE | Status: AC
  Administered 2014-06-23: 16:00:00 via INTRAUTERINE

## 2014-06-23 NOTE — Progress Notes (Signed)
   Subjective:    Patient ID: [name], female    DOB: 01-21-1991, 24 y.o.   MRN: [id number]  HPI  24 yo S AA P2 (3 and 23 yo kids) here today because she would like her Nexplanon removed. It has been in for about 18 months and she has gained about 30+# and feels depressed and bleeds all the time.  Review of Systems Pap normal in the last year    Objective:   Physical Exam  Consent was signed and time out was done. Her right arm was prepped with betadine after establishing the position of the Nexplanon. The area was infiltrated with 2 cc of 1% lidocaine. A small incision was made and the intact rod was easily removed. A steristrip was placed and her arm was noted to be hemostatic. It was bandaged.  She tolerated the procedure well.  I then proceeded with the insertion of Liletta IUD.  Time out procedure done. Cervix prepped with betadine and grasped with a single tooth tenaculum. Liletta was easily placed and the strings were cut to 3-4 cm. Uterus sounded to 9 cm. She tolerated the procedure well.        Assessment & Plan:

## 2014-06-23 NOTE — Addendum Note (Signed)
Addended by: Anell BarrHOWARD, JENNIFER L on: 06/23/2014 03:53 PM   Modules accepted: Orders

## 2014-06-27 ENCOUNTER — Ambulatory Visit: Payer: Medicaid Other | Admitting: Medical

## 2014-08-15 ENCOUNTER — Ambulatory Visit: Payer: Medicaid Other | Admitting: Obstetrics & Gynecology

## 2014-08-26 ENCOUNTER — Emergency Department (HOSPITAL_COMMUNITY)
Admission: EM | Admit: 2014-08-26 | Discharge: 2014-08-26 | Disposition: A | Discharge status: Home / Self Care | Payer: Medicaid Other | Attending: Emergency Medicine | Admitting: Emergency Medicine

## 2014-08-26 ENCOUNTER — Encounter (HOSPITAL_COMMUNITY): Payer: Self-pay | Admitting: Emergency Medicine

## 2014-08-26 DIAGNOSIS — J45901 Unspecified asthma with (acute) exacerbation: Secondary | ICD-10-CM | POA: Insufficient documentation

## 2014-08-26 DIAGNOSIS — Z720 Tobacco use: Secondary | ICD-10-CM | POA: Diagnosis not present

## 2014-08-26 DIAGNOSIS — R05 Cough: Secondary | ICD-10-CM | POA: Diagnosis present

## 2014-08-26 DIAGNOSIS — J069 Acute upper respiratory infection, unspecified: Secondary | ICD-10-CM | POA: Insufficient documentation

## 2014-08-26 DIAGNOSIS — Z8619 Personal history of other infectious and parasitic diseases: Secondary | ICD-10-CM | POA: Diagnosis not present

## 2014-08-26 DIAGNOSIS — B9789 Other viral agents as the cause of diseases classified elsewhere: Secondary | ICD-10-CM

## 2014-08-26 MED ORDER — ALBUTEROL SULFATE HFA 108 (90 BASE) MCG/ACT IN AERS: 2.0000 | INHALATION_SPRAY | RESPIRATORY_TRACT | Status: DC | PRN

## 2014-08-26 MED ORDER — IPRATROPIUM-ALBUTEROL 0.5-2.5 (3) MG/3ML IN SOLN
3.0000 mL | Freq: Once | RESPIRATORY_TRACT | Status: AC
  Administered 2014-08-26: 3 mL via RESPIRATORY_TRACT
  Filled 2014-08-26: qty 3

## 2014-08-26 NOTE — Discharge Instructions (Signed)
You likely picked up an upper respiratory virus. This flared up your asthma. Use the albuterol every 4-6 hours as needed for cough or shortness of breath. You should start to feel better in the next 2-3 days. Please come back if your fevers return or you are getting more short of breath.

## 2014-08-26 NOTE — ED Provider Notes (Signed)
CSN: [id number]     Arrival date & time 08/26/14  1434 History   First MD Initiated Contact with Patient 08/26/14 1458     Chief Complaint  Patient presents with  . Nasal Congestion  . Cough     (Consider location/radiation/quality/duration/timing/severity/associated sxs/prior Treatment) Patient is a 24 y.o. female presenting with cough. The history is provided by the patient.  Cough Cough characteristics:  Non-productive Severity:  Moderate Onset quality:  Sudden Duration:  3 days Timing:  Constant Progression:  Partially resolved Chronicity:  New Smoker: yes   Context: upper respiratory infection   Relieved by:  None tried Worsened by:  Nothing tried Associated symptoms: fever, sore throat and wheezing   Associated symptoms: no chest pain, no ear pain, no myalgias and no shortness of breath   Fever:    Max temp PTA (F):  103   Progression:  Resolved  She is a 24 year old woman here for evaluation of cough. She states her symptoms started 3 days ago with cough, nasal congestion, sore throat, weakness. She reports a temperature of 103 2 days ago. The fever and nasal congestion resolved last night. She continues to have coughing and chest congestion. She also reports intermittent wheezing. She has felt nauseated and has been vomiting. She is tolerating liquids well. No diarrhea or abdominal pain. She has a history of asthma, but has not used an inhaler in several years.  Past Medical History  Diagnosis Date  . Asthma   . Gonorrhea 2011  . Chlamydia 2011  . Vaginal Pap smear, abnormal    Past Surgical History  Procedure Laterality Date  . Knee surgery  2007   Family History  Problem Relation Age of Onset  . Anesthesia problems Neg Hx   . Diabetes Maternal Grandmother   . Hypertension Maternal Grandmother    History  Substance Use Topics  . Smoking status: Current Every Day Smoker -- 0.50 packs/day for 5 years    Types: Cigarettes  . Smokeless tobacco: Never Used  .  Alcohol Use: No     Comment: Quit: 03/17/12   OB History    Gravida Para Term Preterm AB TAB SAB Ectopic Multiple Living   3 2 2  0 1 0 1 0 0 2     Review of Systems  Constitutional: Positive for fever.  HENT: Positive for sore throat. Negative for ear pain.   Respiratory: Positive for cough and wheezing. Negative for shortness of breath.   Cardiovascular: Negative for chest pain.  Musculoskeletal: Negative for myalgias.   As in history of present illness   Allergies  Review of patient's allergies indicates no known allergies.  Home Medications   Prior to Admission medications   Medication Sig Start Date End Date Taking? Authorizing Provider  albuterol (PROVENTIL HFA;VENTOLIN HFA) 108 (90 BASE) MCG/ACT inhaler Inhale 2 puffs into the lungs every 4 (four) hours as needed for wheezing or shortness of breath (cough). 08/26/14   Charm Rings, MD   BP 130/80 mmHg  Pulse 88  Temp(Src) 98.8 F (37.1 C) (Oral)  Resp 18  SpO2 95% Physical Exam  Constitutional: She is oriented to person, place, and time. She appears well-developed and well-nourished.  HENT:  Nose: Nose normal.  Mouth/Throat: Oropharynx is clear and moist. No oropharyngeal exudate.  TMs normal bilaterally  Neck: Neck supple.  Cardiovascular: Normal rate, regular rhythm and normal heart sounds.   No murmur heard. Pulmonary/Chest: Effort normal and breath sounds normal. No respiratory distress. She has no wheezes.  She has no rales.  Frequent coughing spasms  Lymphadenopathy:    She has no cervical adenopathy.  Neurological: She is alert and oriented to person, place, and time.    ED Course  Procedures (including critical care time) Labs Review Labs Reviewed - No data to display  Imaging Review No results found.   EKG Interpretation None      MDM   Final diagnoses:  Viral URI with cough  Asthma exacerbation    DuoNeb given.  Coughing is much improved after the breathing treatment. Recommended  albuterol use for the next several days. Return precautions reviewed.   Charm Rings, MD 08/26/14 [phone]

## 2014-08-26 NOTE — ED Notes (Signed)
Pt sts nasal congestion and cough; pt sts some asthma type sx

## 2014-11-17 ENCOUNTER — Encounter (HOSPITAL_COMMUNITY): Payer: Self-pay

## 2014-11-17 ENCOUNTER — Inpatient Hospital Stay (HOSPITAL_COMMUNITY): Payer: Medicaid Other

## 2014-11-17 ENCOUNTER — Inpatient Hospital Stay (HOSPITAL_COMMUNITY)
Admission: AD | Admit: 2014-11-17 | Discharge: 2014-11-17 | Disposition: A | Discharge status: Home / Self Care | Payer: Medicaid Other | Source: Ambulatory Visit | Attending: Family Medicine | Admitting: Family Medicine

## 2014-11-17 DIAGNOSIS — O210 Mild hyperemesis gravidarum: Secondary | ICD-10-CM | POA: Diagnosis not present

## 2014-11-17 DIAGNOSIS — O208 Other hemorrhage in early pregnancy: Secondary | ICD-10-CM | POA: Diagnosis not present

## 2014-11-17 DIAGNOSIS — O219 Vomiting of pregnancy, unspecified: Secondary | ICD-10-CM

## 2014-11-17 DIAGNOSIS — R1084 Generalized abdominal pain: Secondary | ICD-10-CM | POA: Diagnosis not present

## 2014-11-17 DIAGNOSIS — O99331 Smoking (tobacco) complicating pregnancy, first trimester: Secondary | ICD-10-CM | POA: Insufficient documentation

## 2014-11-17 DIAGNOSIS — F17210 Nicotine dependence, cigarettes, uncomplicated: Secondary | ICD-10-CM | POA: Diagnosis not present

## 2014-11-17 DIAGNOSIS — Z3A01 Less than 8 weeks gestation of pregnancy: Secondary | ICD-10-CM | POA: Diagnosis not present

## 2014-11-17 DIAGNOSIS — O468X1 Other antepartum hemorrhage, first trimester: Secondary | ICD-10-CM

## 2014-11-17 DIAGNOSIS — R109 Unspecified abdominal pain: Secondary | ICD-10-CM

## 2014-11-17 DIAGNOSIS — O26899 Other specified pregnancy related conditions, unspecified trimester: Secondary | ICD-10-CM

## 2014-11-17 DIAGNOSIS — O418X10 Other specified disorders of amniotic fluid and membranes, first trimester, not applicable or unspecified: Secondary | ICD-10-CM

## 2014-11-17 LAB — URINALYSIS, ROUTINE W REFLEX MICROSCOPIC
Bilirubin Urine: NEGATIVE
GLUCOSE, UA: NEGATIVE mg/dL
HGB URINE DIPSTICK: NEGATIVE
KETONES UR: NEGATIVE mg/dL
Leukocytes, UA: NEGATIVE
Nitrite: NEGATIVE
PH: 7.5 (ref 5.0–8.0)
Protein, ur: NEGATIVE mg/dL
Specific Gravity, Urine: 1.02 (ref 1.005–1.030)
Urobilinogen, UA: 1 mg/dL (ref 0.0–1.0)

## 2014-11-17 LAB — WET PREP, GENITAL
TRICH WET PREP: NONE SEEN
Yeast Wet Prep HPF POC: NONE SEEN

## 2014-11-17 LAB — CBC
HCT: 36.5 % (ref 36.0–46.0)
Hemoglobin: 12.2 g/dL (ref 12.0–15.0)
MCH: 26.5 pg (ref 26.0–34.0)
MCHC: 33.4 g/dL (ref 30.0–36.0)
MCV: 79.2 fL (ref 78.0–100.0)
Platelets: 313 10*3/uL (ref 150–400)
RBC: 4.61 MIL/uL (ref 3.87–5.11)
RDW: 15.6 % — ABNORMAL HIGH (ref 11.5–15.5)
WBC: 7.5 10*3/uL (ref 4.0–10.5)

## 2014-11-17 LAB — POCT PREGNANCY, URINE: Preg Test, Ur: POSITIVE — AB

## 2014-11-17 LAB — HCG, QUANTITATIVE, PREGNANCY: hCG, Beta Chain, Quant, S: 915 m[IU]/mL — ABNORMAL HIGH (ref <5)

## 2014-11-17 LAB — ABO/RH: ABO/RH(D): O POS

## 2014-11-17 MED ORDER — METOCLOPRAMIDE HCL 5 MG/ML IJ SOLN: 5.0000 mg | Freq: Once | INTRAMUSCULAR | Status: DC

## 2014-11-17 MED ORDER — DIPHENHYDRAMINE HCL 50 MG/ML IJ SOLN: 25.0000 mg | Freq: Once | INTRAMUSCULAR | Status: DC

## 2014-11-17 MED ORDER — PROMETHAZINE HCL 25 MG PO TABS: 25.0000 mg | ORAL_TABLET | Freq: Four times a day (QID) | ORAL | Status: DC | PRN

## 2014-11-17 NOTE — MAU Note (Signed)
Pt presents to [facility]U with complaints of 6 positive home  Pregnancy test. States she started having lower abdominal cramping today. Denies any vaginal bleeding

## 2014-11-17 NOTE — Discharge Instructions (Signed)
First Trimester of Pregnancy °The first trimester of pregnancy is from week 1 until the end of week 12 (months 1 through 3). During this time, your baby will begin to develop inside you. At 6-8 weeks, the eyes and face are formed, and the heartbeat can be seen on ultrasound. At the end of 12 weeks, all the baby's organs are formed. Prenatal care is all the medical care you receive before the birth of your baby. Make sure you get good prenatal care and follow all of your doctor's instructions. °HOME CARE  °Medicines °· Take medicine only as told by your doctor. Some medicines are safe and some are not during pregnancy. °· Take your prenatal vitamins as told by your doctor. °· Take medicine that helps you poop (stool softener) as needed if your doctor says it is okay. °Diet °· Eat regular, healthy meals. °· Your doctor will tell you the amount of weight gain that is right for you. °· Avoid raw meat and uncooked cheese. °· If you feel sick to your stomach (nauseous) or throw up (vomit): °· Eat 4 or 5 small meals a day instead of 3 large meals. °· Try eating a few soda crackers. °· Drink liquids between meals instead of during meals. °· If you have a hard time pooping (constipation): °· Eat high-fiber foods like fresh vegetables, fruit, and whole grains. °· Drink enough fluids to keep your pee (urine) clear or pale yellow. °Activity and Exercise °· Exercise only as told by your doctor. Stop exercising if you have cramps or pain in your lower belly (abdomen) or low back. °· Try to avoid standing for long periods of time. Move your legs often if you must stand in one place for a long time. °· Avoid heavy lifting. °· Wear low-heeled shoes. Sit and stand up straight. °· You can have sex unless your doctor tells you not to. °Relief of Pain or Discomfort °· Wear a good support bra if your breasts are sore. °· Take warm water baths (sitz baths) to soothe pain or discomfort caused by hemorrhoids. Use hemorrhoid cream if your  doctor says it is okay. °· Rest with your legs raised if you have leg cramps or low back pain. °· Wear support hose if you have puffy, bulging veins (varicose veins) in your legs. Raise (elevate) your feet for 15 minutes, 3-4 times a day. Limit salt in your diet. °Prenatal Care °· Schedule your prenatal visits by the twelfth week of pregnancy. °· Write down your questions. Take them to your prenatal visits. °· Keep all your prenatal visits as told by your doctor. °Safety °· Wear your seat belt at all times when driving. °· Make a list of emergency phone numbers. The list should include numbers for family, friends, the hospital, and police and fire departments. °General Tips °· Ask your doctor for a referral to a local prenatal class. Begin classes no later than at the start of month 6 of your pregnancy. °· Ask for help if you need counseling or help with nutrition. Your doctor can give you advice or tell you where to go for help. °· Do not use hot tubs, steam rooms, or saunas. °· Do not douche or use tampons or scented sanitary pads. °· Do not cross your legs for long periods of time. °· Avoid litter boxes and soil used by cats. °· Avoid all smoking, herbs, and alcohol. Avoid drugs not approved by your doctor. °· Do not use any tobacco products, including cigarettes,   chewing tobacco, and electronic cigarettes. If you need help quitting, ask your doctor. You may get counseling or other support to help you quit.  Visit your dentist. At home, brush your teeth with a soft toothbrush. Be gentle when you floss. GET HELP IF:  You are dizzy.  You have mild cramps or pressure in your lower belly.  You have a nagging pain in your belly area.  You continue to feel sick to your stomach, throw up, or have watery poop (diarrhea).  You have a bad smelling fluid coming from your vagina.  You have pain with peeing (urination).  You have increased puffiness (swelling) in your face, hands, legs, or ankles. GET HELP  RIGHT AWAY IF:   You have a fever.  You are leaking fluid from your vagina.  You have spotting or bleeding from your vagina.  You have very bad belly cramping or pain.  You gain or lose weight rapidly.  You throw up blood. It may look like coffee grounds.  You are around people who have [place] measles, fifth disease, or chickenpox.  You have a very bad headache.  You have shortness of breath.  You have any kind of trauma, such as from a fall or a car accident.   This information is not intended to replace advice given to you by your health care provider. Make sure you discuss any questions you have with your health care provider.   Document Released: 06/26/2007 Document Revised: 01/28/2014 Document Reviewed: 11/17/2012 Elsevier Interactive Patient Education 2016 Else[employer] Inc[employer]    Subchorionic Hematoma A subchorionic hematoma is a gathering of blood between the outer wall of the placenta and the inner wall of the womb (uterus). The placenta is the organ that connects the fetus to the wall of the uterus. The placenta performs the feeding, breathing (oxygen to the fetus), and waste removal (excretory work) of the fetus.  Subchorionic hematoma is the most common abnormality found on a result from ultrasonography done during the first trimester or early second trimester of pregnancy. If there has been little or no vaginal bleeding, early small hematomas usually shrink on their own and do not affect your baby or pregnancy. The blood is gradually absorbed over 1-2 weeks. When bleeding starts later in pregnancy or the hematoma is larger or occurs in an older pregnant woman, the outcome may not be as good. Larger hematomas may get bigger, which increases the chances for miscarriage. Subchorionic hematoma also increases the risk of premature detachment of the placenta from the uterus, preterm (premature) labor, and stillbirth. HOME CARE INSTRUCTIONS  Stay on bed rest if your health care  provider recommends this. Although bed rest will not prevent more bleeding or prevent a miscarriage, your health care provider may recommend bed rest until you are advised otherwise.  Avoid heavy lifting (more than 10 lb [4.5 kg]), exercise, sexual intercourse, or douching as directed by your health care provider.  Keep track of the number of pads you use each day and how soaked (saturated) they are. Write down this information.  Do not use tampons.  Keep all follow-up appointments as directed by your health care provider. Your health care provider may ask you to have follow-up blood tests or ultrasound tests or both. SEEK IMMEDIATE MEDICAL CARE IF:  You have severe cramps in your stomach, back, abdomen, or pelvis.  You have a fever.  You pass large clots or tissue. Save any tissue for your health care provider to look at.  Your  bleeding increases or you become lightheaded, feel weak, or have fainting episodes.   This information is not intended to replace advice given to you by your health care provider. Make sure you discuss any questions you have with your health care provider.   Document Released: 04/24/2006 Document Revised: 01/28/2014 Document Reviewed: 08/06/2012 Elsevier Interactive Patient Education 2016 [employer]     Safe Medications in Pregnancy   Acne: Benzoyl Peroxide Salicylic Acid  Backache/Headache: Tylenol: 2 regular strength every 4 hours OR              2 Extra strength every 6 hours  Colds/Coughs/Allergies: Benadryl (alcohol free) 25 mg every 6 hours as needed Breath right strips Claritin Cepacol throat lozenges Chloraseptic throat spray Cold-Eeze- up to three times per day Cough drops, alcohol free Flonase (by prescription only) Guaifenesin Mucinex Robitussin DM (plain only, alcohol free) Saline nasal spray/drops Sudafed (pseudoephedrine) & Actifed ** use only after [redacted] weeks gestation and if you do not have high blood  pressure Tylenol Vicks Vaporub Zinc lozenges Zyrtec   Constipation: Colace Ducolax suppositories Fleet enema Glycerin suppositories Metamucil Milk of magnesia Miralax Senokot Smooth move tea  Diarrhea: Kaopectate Imodium A-D  *NO pepto Bismol  Hemorrhoids: Anusol Anusol HC Preparation H Tucks  Indigestion: Tums Maalox Mylanta Zantac  Pepcid  Insomnia: Benadryl (alcohol free) 25mg  every 6 hours as needed Tylenol PM Unisom, no Gelcaps  Leg Cramps: Tums MagGel  Nausea/Vomiting:  Bonine Dramamine Emetrol Ginger extract Sea bands Meclizine  Nausea medication to take during pregnancy:  Unisom (doxylamine succinate 25 mg tablets) Take one tablet daily at bedtime. If symptoms are not adequately controlled, the dose can be increased to a maximum recommended dose of two tablets daily (1/2 tablet in the morning, 1/2 tablet mid-afternoon and one at bedtime). Vitamin B6 100mg  tablets. Take one tablet twice a day (up to 200 mg per day).  Skin Rashes: Aveeno products Benadryl cream or 25mg  every 6 hours as needed Calamine Lotion 1% cortisone cream  Yeast infection: Gyne-lotrimin 7 Monistat 7   **If taking multiple medications, please check labels to avoid duplicating the same active ingredients **take medication as directed on the label ** Do not exceed 4000 mg of tylenol in 24 hours **Do not take medications that contain aspirin or ibuprofen

## 2014-11-17 NOTE — MAU Provider Note (Signed)
History     CSN: [id number]  Arrival date and time: 11/17/14 1307   First Provider Initiated Contact with Patient 11/17/14 1401         Chief Complaint  Patient presents with  . Possible Pregnancy  . Abdominal Cramping   HPI [name] is a 24 y.o. female who presents for abdominal pain & possible pregnancy.  Positive pregnancy tests at home x 6.  Abdominal cramping since this morning.  No bleeding or discharge.  Rates as 4/10, no treatment. Nausea & vomiting this week. Last vomited this morning.  Denies diarrhea or constipation.  Denies urinary complaints or fever.  Plans on getting care with [facility].     OB History    Gravida Para Term Preterm AB TAB SAB Ectopic Multiple Living   0 1 0 1 0 0 2      Past Medical History  Diagnosis Date  . Asthma   . Gonorrhea 2011  . Chlamydia 2011  . Vaginal Pap smear, abnormal     Past Surgical History  Procedure Laterality Date  . Knee surgery  2007    Family History  Problem Relation Age of Onset  . Anesthesia problems Neg Hx   . Diabetes Maternal Grandmother   . Hypertension Maternal Grandmother     Social History  Substance Use Topics  . Smoking status: Current Every Day Smoker -- 0.25 packs/day for 5 years    Types: Cigarettes  . Smokeless tobacco: Never Used  . Alcohol Use: No     Comment: Quit: 03/17/12    Allergies: No Known Allergies  No prescriptions prior to admission    Review of Systems  Constitutional: Negative.   HENT: Negative.   Gastrointestinal: Positive for nausea, vomiting and abdominal pain. Negative for diarrhea and constipation.  Genitourinary: Negative.    Physical Exam   Blood pressure 144/63, pulse 59, resp. rate 18, height  (1.676 m), weight 298 lb (135.172 kg), last menstrual period 10/15/2014.  Physical Exam  Nursing note and vitals reviewed. Constitutional: She is oriented to person, place, and time. She appears well-developed and well-nourished. No  distress.  HENT:  Head: Normocephalic and atraumatic.  Eyes: Conjunctivae are normal. Right eye exhibits no discharge. Left eye exhibits no discharge. No scleral icterus.  Neck: Normal range of motion.  Cardiovascular: Normal rate, regular rhythm and normal heart sounds.   No murmur heard. Respiratory: Effort normal and breath sounds normal. No respiratory distress. She has no wheezes.  GI: Soft. Bowel sounds are normal. She exhibits no distension. There is no tenderness.  Genitourinary: Vagina normal and uterus normal. Cervix exhibits discharge (small amount of thin white discharge). Cervix exhibits no motion tenderness and no friability.  Cervix closed  Neurological: She is alert and oriented to person, place, and time.  Skin: Skin is warm and dry. She is not diaphoretic.  Psychiatric: She has a normal mood and affect. Her behavior is normal. Judgment and thought content normal.    MAU Course  Procedures Results for orders placed or performed during the hospital encounter of 11/17/14 (from the past 24 hour(s))  Urinalysis, Routine w reflex microscopic (not at [facility])     Status: None   Collection Time: 11/17/14  1:30 PM  Result Value Ref Range   Color, Urine YELLOW YELLOW   APPearance CLEAR CLEAR   Specific Gravity, Urine 1.020 1.005 - 1.030   pH 7.5 5.0 - 8.0   Glucose, UA NEGATIVE NEGATIVE mg/dL   Hgb  urine dipstick NEGATIVE NEGATIVE   Bilirubin Urine NEGATIVE NEGATIVE   Ketones, ur NEGATIVE NEGATIVE mg/dL   Protein, ur NEGATIVE NEGATIVE mg/dL   Urobilinogen, UA 1.0 0.0 - 1.0 mg/dL   Nitrite NEGATIVE NEGATIVE   Leukocytes, UA NEGATIVE NEGATIVE  Pregnancy, urine POC     Status: Abnormal   Collection Time: 11/17/14  1:41 PM  Result Value Ref Range   Preg Test, Ur POSITIVE (A) NEGATIVE  CBC     Status: Abnormal   Collection Time: 11/17/14  2:16 PM  Result Value Ref Range   WBC 7.5 4.0 - 10.5 K/uL   RBC 4.61 3.87 - 5.11 MIL/uL   Hemoglobin 12.2 12.0 - 15.0 g/dL   HCT 04.5  40.9 - 81.1 %   MCV 79.2 78.0 - 100.0 fL   MCH 26.5 26.0 - 34.0 pg   MCHC 33.4 30.0 - 36.0 g/dL   RDW 91.4 (H) 78.2 - 95.6 %   Platelets 313 150 - 400 K/uL  ABO/Rh     Status: None   Collection Time: 11/17/14  2:16 PM  Result Value Ref Range   ABO/RH(D) O POS   hCG, quantitative, pregnancy     Status: Abnormal   Collection Time: 11/17/14  2:16 PM  Result Value Ref Range   hCG, Beta Chain, Quant, S 915 (H) <5 mIU/mL  Wet prep, genital     Status: Abnormal   Collection Time: 11/17/14  2:25 PM  Result Value Ref Range   Yeast Wet Prep HPF POC NONE SEEN NONE SEEN   Trich, Wet Prep NONE SEEN NONE SEEN   Clue Cells Wet Prep HPF POC FEW (A) NONE SEEN   WBC, Wet Prep HPF POC MODERATE (A) NONE SEEN   US Ob Comp Less 14 Wks  11/17/2014  CLINICAL DATA:  Lower abdominal pain and cramping today. Four weeks and 5 days pregnant by last menstrual period. EXAM: OBSTETRIC <14 WK [place] AND TRANSVAGINAL OB US TECHNIQUE: Both transabdominal and transvaginal ultrasound examinations were performed for complete evaluation of the gestation as well as the maternal uterus, adnexal regions, and pelvic cul-de-sac. Transvaginal technique was performed to assess early pregnancy. COMPARISON:  Previous examinations, the most recent dated 04/09/2012. FINDINGS: Intrauterine gestational sac: Visualized/normal in shape. Yolk sac:  Visualized/ normal in shape. Embryo:  Not visualized Cardiac Activity: Not visualized MSD: 2.8  mm   5 w   0  d             [place] EDC: 07/20/2015 Maternal uterus/adnexae: Normal appearing ovaries with a left ovarian corpus luteum noted. Small to moderate-sized subchorionic hemorrhage with low-level internal echoes. Trace free peritoneal fluid. IMPRESSION: 1. Intrauterine gestational sac with an estimated gestational age by a mean sac diameter of 5 weeks and 0 days. 2. A yolk sac is visualized within the gestational sac without visualization of the fetal pole at this time. This is a normal finding early in  gestation. Serial quantitative beta HCG levels and follow-up ultrasound in 2 weeks are recommended. 3. Small to moderate-sized subchorionic hemorrhage. Electronically Signed   By: Beckie Salts M.D.   On: 11/17/2014 15:46   US Ob Transvaginal  11/17/2014  CLINICAL DATA:  Lower abdominal pain and cramping today. Four weeks and 5 days pregnant by last menstrual period. EXAM: OBSTETRIC <14 WK [place] AND TRANSVAGINAL OB US TECHNIQUE: Both transabdominal and transvaginal ultrasound examinations were performed for complete evaluation of the gestation as well as the maternal uterus, adnexal regions, and pelvic cul-de-sac. Transvaginal technique  was performed to assess early pregnancy. COMPARISON:  Previous examinations, the most recent dated 04/09/2012. FINDINGS: Intrauterine gestational sac: Visualized/normal in shape. Yolk sac:  Visualized/ normal in shape. Embryo:  Not visualized Cardiac Activity: Not visualized MSD: 2.8  mm   5 w   0  d             US EDC: 07/20/2015 Maternal uterus/adnexae: Normal appearing ovaries with a left ovarian corpus luteum noted. Small to moderate-sized subchorionic hemorrhage with low-level internal echoes. Trace free peritoneal fluid. IMPRESSION: 1. Intrauterine gestational sac with an estimated gestational age by a mean sac diameter of 5 weeks and 0 days. 2. A yolk sac is visualized within the gestational sac without visualization of the fetal pole at this time. This is a normal finding early in gestation. Serial quantitative beta HCG levels and follow-up ultrasound in 2 weeks are recommended. 3. Small to moderate-sized subchorionic hemorrhage. Electronically Signed   By: Beckie SaltsSteven  Reid M.D.   On: 11/17/2014 15:46    MDM UPT positive CBC, ultrasound, BHCG, abo/rh, GC/CT, wet prep Ultrasound - IUGS with yolk sac Assessment and Plan  A: 1. Subchorionic hematoma in first trimester   2. Abdominal pain in pregnancy   3. Nausea and vomiting in pregnancy prior to [redacted] weeks gestation     P: Discharge home Rx phenergan Schedule prenatal care with [facility] Pregnancy verification letter given Take prenatal vitamins Discussed reasons to return to [facility]  Judeth HornErin Meline Russaw, NP  11/17/2014, 2:00 PM

## 2014-11-18 LAB — GC/CHLAMYDIA PROBE AMP (~~LOC~~) NOT AT ARMC
CHLAMYDIA, DNA PROBE: NEGATIVE
NEISSERIA GONORRHEA: NEGATIVE

## 2014-11-18 LAB — HIV ANTIBODY (ROUTINE TESTING W REFLEX): HIV SCREEN 4TH GENERATION: NONREACTIVE

## 2014-12-07 ENCOUNTER — Encounter: Payer: Medicaid Other | Admitting: Advanced Practice Midwife

## 2014-12-08 ENCOUNTER — Encounter: Payer: Medicaid Other | Admitting: Family Medicine

## 2014-12-13 ENCOUNTER — Inpatient Hospital Stay (HOSPITAL_COMMUNITY): Payer: Medicaid Other

## 2014-12-13 ENCOUNTER — Encounter (HOSPITAL_COMMUNITY): Payer: Self-pay | Admitting: *Deleted

## 2014-12-13 ENCOUNTER — Inpatient Hospital Stay (HOSPITAL_COMMUNITY)
Admission: AD | Admit: 2014-12-13 | Discharge: 2014-12-13 | Disposition: A | Discharge status: Home / Self Care | Payer: Medicaid Other | Source: Ambulatory Visit | Attending: Obstetrics & Gynecology | Admitting: Obstetrics & Gynecology

## 2014-12-13 DIAGNOSIS — O418X10 Other specified disorders of amniotic fluid and membranes, first trimester, not applicable or unspecified: Secondary | ICD-10-CM

## 2014-12-13 DIAGNOSIS — Z3A08 8 weeks gestation of pregnancy: Secondary | ICD-10-CM | POA: Insufficient documentation

## 2014-12-13 DIAGNOSIS — O209 Hemorrhage in early pregnancy, unspecified: Secondary | ICD-10-CM

## 2014-12-13 DIAGNOSIS — O468X1 Other antepartum hemorrhage, first trimester: Secondary | ICD-10-CM

## 2014-12-13 DIAGNOSIS — O208 Other hemorrhage in early pregnancy: Secondary | ICD-10-CM | POA: Insufficient documentation

## 2014-12-13 DIAGNOSIS — F17210 Nicotine dependence, cigarettes, uncomplicated: Secondary | ICD-10-CM | POA: Insufficient documentation

## 2014-12-13 DIAGNOSIS — O99331 Smoking (tobacco) complicating pregnancy, first trimester: Secondary | ICD-10-CM | POA: Diagnosis not present

## 2014-12-13 LAB — URINALYSIS, ROUTINE W REFLEX MICROSCOPIC
BILIRUBIN URINE: NEGATIVE
Glucose, UA: NEGATIVE mg/dL
Ketones, ur: NEGATIVE mg/dL
Leukocytes, UA: NEGATIVE
NITRITE: NEGATIVE
PROTEIN: NEGATIVE mg/dL
Specific Gravity, Urine: >1.03 — ABNORMAL HIGH (ref 1.005–1.030)
pH: 6.5 (ref 5.0–8.0)

## 2014-12-13 LAB — URINE MICROSCOPIC-ADD ON

## 2014-12-13 LAB — WET PREP, GENITAL
CLUE CELLS WET PREP: NONE SEEN
Sperm: NONE SEEN
Trich, Wet Prep: NONE SEEN
Yeast Wet Prep HPF POC: NONE SEEN

## 2014-12-13 NOTE — Discharge Instructions (Signed)
Subchorionic Hematoma °A subchorionic hematoma is a gathering of blood between the outer wall of the placenta and the inner wall of the womb (uterus). The placenta is the organ that connects the fetus to the wall of the uterus. The placenta performs the feeding, breathing (oxygen to the fetus), and waste removal (excretory work) of the fetus.  °Subchorionic hematoma is the most common abnormality found on a result from ultrasonography done during the first trimester or early second trimester of pregnancy. If there has been little or no vaginal bleeding, early small hematomas usually shrink on their own and do not affect your baby or pregnancy. The blood is gradually absorbed over 1-2 weeks. When bleeding starts later in pregnancy or the hematoma is larger or occurs in an older pregnant woman, the outcome may not be as good. Larger hematomas may get bigger, which increases the chances for miscarriage. Subchorionic hematoma also increases the risk of premature detachment of the placenta from the uterus, preterm (premature) labor, and stillbirth. °HOME CARE INSTRUCTIONS °· Stay on bed rest if your health care provider recommends this. Although bed rest will not prevent more bleeding or prevent a miscarriage, your health care provider may recommend bed rest until you are advised otherwise. °· Avoid heavy lifting (more than 10 lb [4.5 kg]), exercise, sexual intercourse, or douching as directed by your health care provider. °· Keep track of the number of pads you use each day and how soaked (saturated) they are. Write down this information. °· Do not use tampons. °· Keep all follow-up appointments as directed by your health care provider. Your health care provider may ask you to have follow-up blood tests or ultrasound tests or both. °SEEK IMMEDIATE MEDICAL CARE IF: °· You have severe cramps in your stomach, back, abdomen, or pelvis. °· You have a fever. °· You pass large clots or tissue. Save any tissue for your health  care provider to look at. °· Your bleeding increases or you become lightheaded, feel weak, or have fainting episodes. °  °This information is not intended to replace advice given to you by your health care provider. Make sure you discuss any questions you have with your health care provider. °  °Document Released: 04/24/2006 Document Revised: 01/28/2014 Document Reviewed: 08/06/2012 °Elsevier Interactive Patient Education ©2016 [employer] Inc[employer] ° °Pelvic Rest °Pelvic rest is sometimes recommended for women when:  °· The placenta is partially or completely covering the opening of the cervix (placenta previa). °· There is bleeding between the uterine wall and the amniotic sac in the first trimester (subchorionic hemorrhage). °· The cervix begins to open without labor starting (incompetent cervix, cervical insufficiency). °· The labor is too early (preterm labor). °HOME CARE INSTRUCTIONS °· Do not have sexual intercourse, stimulation, or an orgasm. °· Do not use tampons, douche, or put anything in the vagina. °· Do not lift anything over 10 pounds (4.5 kg). °· Avoid strenuous activity or straining your pelvic muscles. °SEEK MEDICAL CARE IF:  °· You have any vaginal bleeding during pregnancy. Treat this as a potential emergency. °· You have cramping pain felt low in the stomach (stronger than menstrual cramps). °· You notice vaginal discharge (watery, mucus, or bloody). °· You have a low, dull backache. °· There are regular contractions or uterine tightening. °SEEK IMMEDIATE MEDICAL CARE IF: °You have vaginal bleeding and have placenta previa.  °  °This information is not intended to replace advice given to you by your health care provider. Make sure you discuss any questions you have   with your health care provider. °  °Document Released: 05/04/2010 Document Revised: 04/01/2011 Document Reviewed: 07/11/2014 °Elsevier Interactive Patient Education ©2016 Elsevier Inc. ° ° ° ° ° °

## 2014-12-13 NOTE — MAU Provider Note (Signed)
History     CSN: [id number]  Arrival date and time: 12/13/14 1426   None     Chief Complaint  Patient presents with  . Vaginal Bleeding   HPI [name] is a 24 y.o. [id number] at 7160w3d who presents to MAU today with complaint of spotting with wiping since this morning. The patient was seen in MAU on 11/17/14. US that day showed IUGS and [facility]. The patient states no bleeding until today. She denies abdominal pain, abnormal discharge, fever, UTI symptoms today. She has had intermittent N/V without diarrhea or constipation. She states that nausea is often associated with PNV, so she has started taking them at night.   OB History    Gravida Para Term Preterm AB TAB SAB Ectopic Multiple Living   4 2 2  0 1 0 1 0 0 2      Past Medical History  Diagnosis Date  . Asthma   . Gonorrhea 2011  . Chlamydia 2011  . Vaginal Pap smear, abnormal     Past Surgical History  Procedure Laterality Date  . Knee surgery  2007    Family History  Problem Relation Age of Onset  . Anesthesia problems Neg Hx   . Diabetes Maternal Grandmother   . Hypertension Maternal Grandmother     Social History  Substance Use Topics  . Smoking status: Current Every Day Smoker -- 0.25 packs/day for 5 years    Types: Cigarettes  . Smokeless tobacco: Never Used  . Alcohol Use: No     Comment: Quit: 03/17/12    Allergies: No Known Allergies  Prescriptions prior to admission  Medication Sig Dispense Refill Last Dose  . promethazine (PHENERGAN) 25 MG tablet Take 1 tablet (25 mg total) by mouth every 6 (six) hours as needed for nausea or vomiting. (Patient not taking: Reported on 12/13/2014) 30 tablet 0     Review of Systems  Constitutional: Negative for fever and malaise/fatigue.  Gastrointestinal: Negative for nausea, vomiting, abdominal pain, diarrhea and constipation.  Genitourinary: Negative for dysuria, urgency and frequency.       + vaginal bleeding    Physical Exam   Blood pressure 130/62, pulse 85, temperature 98.2 F (36.8 C), resp. rate 18, last menstrual period 10/15/2014.  Physical Exam  Nursing note and vitals reviewed. Constitutional: She is oriented to person, place, and time. She appears well-developed and well-nourished. No distress.  HENT:  Head: Normocephalic and atraumatic.  Cardiovascular: Normal rate.   Respiratory: Effort normal.  GI: Soft. She exhibits no distension and no mass. There is no tenderness. There is no rebound and no guarding.  Neurological: She is alert and oriented to person, place, and time.  Skin: Skin is warm and dry. No erythema.  Psychiatric: She has a normal mood and affect.   Results for orders placed or performed during the hospital encounter of 12/13/14 (from the past 24 hour(s))  Urinalysis, Routine w reflex microscopic ([facility])     Status: Abnormal   Collection Time: 12/13/14  2:30 PM  Result Value Ref Range   Color, Urine YELLOW YELLOW   APPearance CLEAR CLEAR   Specific Gravity, Urine >1.030 (H) 1.005 - 1.030   pH 6.5 5.0 - 8.0   Glucose, UA NEGATIVE NEGATIVE mg/dL   Hgb urine dipstick LARGE (A) NEGATIVE   Bilirubin Urine NEGATIVE NEGATIVE   Ketones, ur NEGATIVE NEGATIVE mg/dL   Protein, ur NEGATIVE NEGATIVE mg/dL   Nitrite NEGATIVE NEGATIVE  Leukocytes, UA NEGATIVE NEGATIVE  Urine microscopic-add on     Status: Abnormal   Collection Time: 12/13/14  2:30 PM  Result Value Ref Range   Squamous Epithelial / LPF 6-30 (A) NONE SEEN   WBC, UA 0-5 0 - 5 WBC/hpf   RBC / HPF 0-5 0 - 5 RBC/hpf   Bacteria, UA MANY (A) NONE SEEN   Urine-Other MUCOUS PRESENT    US Ob Transvaginal  12/13/2014  CLINICAL DATA:  Pregnant, vaginal bleeding EXAM: TRANSVAGINAL OB ULTRASOUND TECHNIQUE: Transvaginal ultrasound was performed for complete evaluation of the gestation as well as the maternal uterus, adnexal regions, and pelvic cul-de-sac. COMPARISON:  11/17/2014 FINDINGS: Intrauterine  gestational sac: Visualized/normal in shape. Yolk sac:  Present Embryo:  Present Cardiac Activity: Present Heart Rate: 171 bpm CRL:   18.8  mm   8 w 3 d                  [place] EDC: 07/22/2015 Maternal uterus/adnexae: Small subchronic hemorrhage. Bilateral ovaries are within normal limits. No free fluid. IMPRESSION: Single live intrauterine gestation, with estimated gestational age [redacted] weeks 3 days by crown-rump length. Electronically Signed   By: Charline Bills M.D.   On: 12/13/2014 15:10    MAU Course  Procedures None  MDM UA today [place] ordered  Assessment and Plan  A: SIUP at [redacted]w[redacted]d Small subchorionic hemorrhage  P: Discharge home First trimester/bleeding precautions discussed Patient advised to follow-up with [facility] as scheduled for routine prenatal care or sooner PRN Patient may return to MAU as needed or if her condition were to change or worsen   Marny Lowenstein, PA-C  12/13/2014, 3:23 PM

## 2014-12-13 NOTE — MAU Note (Signed)
Pt presents to MAU with complaints of vaginal bleeding when she wipes. Denies any pain.  

## 2014-12-19 ENCOUNTER — Ambulatory Visit (INDEPENDENT_AMBULATORY_CARE_PROVIDER_SITE_OTHER): Payer: Medicaid Other | Admitting: Obstetrics & Gynecology

## 2014-12-19 ENCOUNTER — Other Ambulatory Visit (HOSPITAL_COMMUNITY)
Admission: RE | Admit: 2014-12-19 | Discharge: 2014-12-19 | Disposition: A | Discharge status: Home / Self Care | Payer: Medicaid Other | Source: Ambulatory Visit | Attending: Obstetrics & Gynecology | Admitting: Obstetrics & Gynecology

## 2014-12-19 ENCOUNTER — Encounter: Payer: Self-pay | Admitting: Obstetrics & Gynecology

## 2014-12-19 VITALS — BP 113/60 | HR 82 | Wt 304.0 lb

## 2014-12-19 DIAGNOSIS — Z113 Encounter for screening for infections with a predominantly sexual mode of transmission: Secondary | ICD-10-CM | POA: Insufficient documentation

## 2014-12-19 DIAGNOSIS — Z3482 Encounter for supervision of other normal pregnancy, second trimester: Secondary | ICD-10-CM | POA: Diagnosis not present

## 2014-12-19 DIAGNOSIS — Z3490 Encounter for supervision of normal pregnancy, unspecified, unspecified trimester: Secondary | ICD-10-CM | POA: Insufficient documentation

## 2014-12-19 DIAGNOSIS — Z3491 Encounter for supervision of normal pregnancy, unspecified, first trimester: Secondary | ICD-10-CM

## 2014-12-19 LAB — OB RESULTS CONSOLE GC/CHLAMYDIA: GC PROBE AMP, GENITAL: NEGATIVE

## 2014-12-19 MED ORDER — PRENATAL PLUS DHA 7-0.4-100 MG PO TABS
1.0000 | ORAL_TABLET | Freq: Every day | ORAL | Status: DC
Start: 2014-12-19 — End: 2017-05-28

## 2014-12-19 MED ORDER — ONDANSETRON 4 MG PO TBDP: 4.0000 mg | ORAL_TABLET | Freq: Four times a day (QID) | ORAL | Status: DC | PRN

## 2014-12-19 NOTE — Patient Instructions (Signed)
First Trimester of Pregnancy The first trimester of pregnancy is from week 1 until the end of week 12 (months 1 through 3). A week after a sperm fertilizes an egg, the egg will implant on the wall of the uterus. This embryo will begin to develop into a baby. Genes from you and your partner are forming the baby. The female genes determine whether the baby is a boy or a girl. At 6-8 weeks, the eyes and face are formed, and the heartbeat can be seen on ultrasound. At the end of 12 weeks, all the baby's organs are formed.  Now that you are pregnant, you will want to do everything you can to have a healthy baby. Two of the most important things are to get good prenatal care and to follow your health care provider's instructions. Prenatal care is all the medical care you receive before the baby's birth. This care will help prevent, find, and treat any problems during the pregnancy and childbirth. BODY CHANGES Your body goes through many changes during pregnancy. The changes vary from woman to woman.   You may gain or lose a couple of pounds at first.  You may feel sick to your stomach (nauseous) and throw up (vomit). If the vomiting is uncontrollable, call your health care provider.  You may tire easily.  You may develop headaches that can be relieved by medicines approved by your health care provider.  You may urinate more often. Painful urination may mean you have a bladder infection.  You may develop heartburn as a result of your pregnancy.  You may develop constipation because certain hormones are causing the muscles that push waste through your intestines to slow down.  You may develop hemorrhoids or swollen, bulging veins (varicose veins).  Your breasts may begin to grow larger and become tender. Your nipples may stick out more, and the tissue that surrounds them (areola) may become darker.  Your gums may bleed and may be sensitive to brushing and flossing.  Dark spots or blotches (chloasma,  mask of pregnancy) may develop on your face. This will likely fade after the baby is born.  Your menstrual periods will stop.  You may have a loss of appetite.  You may develop cravings for certain kinds of food.  You may have changes in your emotions from day to day, such as being excited to be pregnant or being concerned that something may go wrong with the pregnancy and baby.  You may have more vivid and strange dreams.  You may have changes in your hair. These can include thickening of your hair, rapid growth, and changes in texture. Some women also have hair loss during or after pregnancy, or hair that feels dry or thin. Your hair will most likely return to normal after your baby is born. WHAT TO EXPECT AT YOUR PRENATAL VISITS During a routine prenatal visit:  You will be weighed to make sure you and the baby are growing normally.  Your blood pressure will be taken.  Your abdomen will be measured to track your baby's growth.  The fetal heartbeat will be listened to starting around week 10 or 12 of your pregnancy.  Test results from any previous visits will be discussed. Your health care provider may ask you:  How you are feeling.  If you are feeling the baby move.  If you have had any abnormal symptoms, such as leaking fluid, bleeding, severe headaches, or abdominal cramping.  If you are using any tobacco products,   including cigarettes, chewing tobacco, and electronic cigarettes.  If you have any questions. Other tests that may be performed during your first trimester include:  Blood tests to find your blood type and to check for the presence of any previous infections. They will also be used to check for low iron levels (anemia) and Rh antibodies. Later in the pregnancy, blood tests for diabetes will be done along with other tests if problems develop.  Urine tests to check for infections, diabetes, or protein in the urine.  An ultrasound to confirm the proper growth  and development of the baby.  An amniocentesis to check for possible genetic problems.  Fetal screens for spina bifida and Down syndrome.  You may need other tests to make sure you and the baby are doing well.  HIV (human immunodeficiency virus) testing. Routine prenatal testing includes screening for HIV, unless you choose not to have this test. HOME CARE INSTRUCTIONS  Medicines  Follow your health care provider's instructions regarding medicine use. Specific medicines may be either safe or unsafe to take during pregnancy.  Take your prenatal vitamins as directed.  If you develop constipation, try taking a stool softener if your health care provider approves. Diet  Eat regular, well-balanced meals. Choose a variety of foods, such as meat or vegetable-based protein, fish, milk and low-fat dairy products, vegetables, fruits, and whole grain breads and cereals. Your health care provider will help you determine the amount of weight gain that is right for you.  Avoid raw meat and uncooked cheese. These carry germs that can cause birth defects in the baby.  Eating four or five small meals rather than three large meals a day may help relieve nausea and vomiting. If you start to feel nauseous, eating a few soda crackers can be helpful. Drinking liquids between meals instead of during meals also seems to help nausea and vomiting.  If you develop constipation, eat more high-fiber foods, such as fresh vegetables or fruit and whole grains. Drink enough fluids to keep your urine clear or pale yellow. Activity and Exercise  Exercise only as directed by your health care provider. Exercising will help you:  Control your weight.  Stay in shape.  Be prepared for labor and delivery.  Experiencing pain or cramping in the lower abdomen or low back is a good sign that you should stop exercising. Check with your health care provider before continuing normal exercises.  Try to avoid standing for long  periods of time. Move your legs often if you must stand in one place for a long time.  Avoid heavy lifting.  Wear low-heeled shoes, and practice good posture.  You may continue to have sex unless your health care provider directs you otherwise. Relief of Pain or Discomfort  Wear a good support bra for breast tenderness.   Take warm sitz baths to soothe any pain or discomfort caused by hemorrhoids. Use hemorrhoid cream if your health care provider approves.   Rest with your legs elevated if you have leg cramps or low back pain.  If you develop varicose veins in your legs, wear support hose. Elevate your feet for 15 minutes, 3-4 times a day. Limit salt in your diet. Prenatal Care  Schedule your prenatal visits by the twelfth week of pregnancy. They are usually scheduled monthly at first, then more often in the last 2 months before delivery.  Write down your questions. Take them to your prenatal visits.  Keep all your prenatal visits as directed by your   health care provider. Safety  Wear your seat belt at all times when driving.  Make a list of emergency phone numbers, including numbers for family, friends, the hospital, and police and fire departments. General Tips  Ask your health care provider for a referral to a local prenatal education class. Begin classes no later than at the beginning of month 6 of your pregnancy.  Ask for help if you have counseling or nutritional needs during pregnancy. Your health care provider can offer advice or refer you to specialists for help with various needs.  Do not use hot tubs, steam rooms, or saunas.  Do not douche or use tampons or scented sanitary pads.  Do not cross your legs for long periods of time.  Avoid cat litter boxes and soil used by cats. These carry germs that can cause birth defects in the baby and possibly loss of the fetus by miscarriage or stillbirth.  Avoid all smoking, herbs, alcohol, and medicines not prescribed by  your health care provider. Chemicals in these affect the formation and growth of the baby.  Do not use any tobacco products, including cigarettes, chewing tobacco, and electronic cigarettes. If you need help quitting, ask your health care provider. You may receive counseling support and other resources to help you quit.  Schedule a dentist appointment. At home, brush your teeth with a soft toothbrush and be gentle when you floss. SEEK MEDICAL CARE IF:   You have dizziness.  You have mild pelvic cramps, pelvic pressure, or nagging pain in the abdominal area.  You have persistent nausea, vomiting, or diarrhea.  You have a bad smelling vaginal discharge.  You have pain with urination.  You notice increased swelling in your face, hands, legs, or ankles. SEEK IMMEDIATE MEDICAL CARE IF:   You have a fever.  You are leaking fluid from your vagina.  You have spotting or bleeding from your vagina.  You have severe abdominal cramping or pain.  You have rapid weight gain or loss.  You vomit blood or material that looks like coffee grounds.  You are exposed to German measles and have never had them.  You are exposed to fifth disease or chickenpox.  You develop a severe headache.  You have shortness of breath.  You have any kind of trauma, such as from a fall or a car accident.   This information is not intended to replace advice given to you by your health care provider. Make sure you discuss any questions you have with your health care provider.   Document Released: 01/01/2001 Document Revised: 01/28/2014 Document Reviewed: 11/17/2012 Elsevier Interactive Patient Education 2016 [employer] Inc.  

## 2014-12-20 LAB — GC/CHLAMYDIA PROBE AMP (~~LOC~~) NOT AT ARMC
CHLAMYDIA, DNA PROBE: NEGATIVE
NEISSERIA GONORRHEA: NEGATIVE

## 2014-12-20 LAB — CULTURE, URINE COMPREHENSIVE
Colony Count: NO GROWTH
ORGANISM ID, BACTERIA: NO GROWTH

## 2014-12-23 LAB — OBSTETRIC PANEL
Antibody Screen: NEGATIVE
BASOS ABS: 0 10*3/uL (ref 0.0–0.1)
Basophils Relative: 0 % (ref 0–1)
EOS ABS: 0.1 10*3/uL (ref 0.0–0.7)
Eosinophils Relative: 1 % (ref 0–5)
HCT: 35.7 % — ABNORMAL LOW (ref 36.0–46.0)
HEMOGLOBIN: 11.8 g/dL — AB (ref 12.0–15.0)
HEP B S AG: NEGATIVE
LYMPHS PCT: 29 % (ref 12–46)
Lymphs Abs: 2.8 10*3/uL (ref 0.7–4.0)
MCH: 26 pg (ref 26.0–34.0)
MCHC: 33.1 g/dL (ref 30.0–36.0)
MCV: 78.8 fL (ref 78.0–100.0)
MPV: 9.9 fL (ref 8.6–12.4)
Monocytes Absolute: 0.6 10*3/uL (ref 0.1–1.0)
Monocytes Relative: 6 % (ref 3–12)
NEUTROS PCT: 64 % (ref 43–77)
Neutro Abs: 6.1 10*3/uL (ref 1.7–7.7)
Platelets: 336 10*3/uL (ref 150–400)
RBC: 4.53 MIL/uL (ref 3.87–5.11)
RDW: 15.5 % (ref 11.5–15.5)
Rh Type: POSITIVE
Rubella: 2.78 Index — ABNORMAL HIGH (ref <0.90)
WBC: 9.5 10*3/uL (ref 4.0–10.5)

## 2014-12-23 LAB — GLUCOSE TOLERANCE, 1 HOUR (50G) W/O FASTING: Glucose, 1 Hour GTT: 115 mg/dL (ref 70–140)

## 2014-12-26 LAB — HIV ANTIBODY (ROUTINE TESTING W REFLEX): HIV 1&2 Ab, 4th Generation: NONREACTIVE

## 2014-12-28 LAB — CYSTIC FIBROSIS DIAGNOSTIC STUDY

## 2015-01-11 ENCOUNTER — Telehealth: Payer: Self-pay

## 2015-01-11 NOTE — Telephone Encounter (Signed)
Patient calls stating that on the evening of 01-09-15 she vomited blood. Patient also states yesterday 01-10-15 she had another occurrence of vomiting blood- patient states it was all blood. Asked her if she has been able to keep anything drink/ food down and she states a "little bit." Patient denies any other symptoms. [facility]. Alisha StammerJennifer Jalil Lorusso RN BSN

## 2015-01-17 ENCOUNTER — Encounter: Payer: Self-pay | Admitting: Certified Nurse Midwife

## 2015-01-17 ENCOUNTER — Ambulatory Visit (INDEPENDENT_AMBULATORY_CARE_PROVIDER_SITE_OTHER): Payer: Medicaid Other | Admitting: Certified Nurse Midwife

## 2015-01-17 VITALS — BP 115/54 | HR 84 | Temp 97.8°F | Wt 302.0 lb

## 2015-01-17 DIAGNOSIS — O99611 Diseases of the digestive system complicating pregnancy, first trimester: Secondary | ICD-10-CM | POA: Diagnosis not present

## 2015-01-17 DIAGNOSIS — K219 Gastro-esophageal reflux disease without esophagitis: Secondary | ICD-10-CM

## 2015-01-17 DIAGNOSIS — Z3491 Encounter for supervision of normal pregnancy, unspecified, first trimester: Secondary | ICD-10-CM | POA: Diagnosis present

## 2015-01-17 LAB — POCT URINALYSIS DIP (DEVICE)
Bilirubin Urine: NEGATIVE
Glucose, UA: NEGATIVE mg/dL
Ketones, ur: NEGATIVE mg/dL
Nitrite: NEGATIVE
Protein, ur: NEGATIVE mg/dL
Specific Gravity, Urine: 1.02 (ref 1.005–1.030)
Urobilinogen, UA: 1 mg/dL (ref 0.0–1.0)
pH: 7 (ref 5.0–8.0)

## 2015-01-17 MED ORDER — METOCLOPRAMIDE HCL 10 MG PO TABS
10.0000 mg | ORAL_TABLET | Freq: Four times a day (QID) | ORAL | Status: DC
Start: 2015-01-17 — End: 2015-05-04

## 2015-01-17 MED ORDER — RANITIDINE HCL 300 MG PO TABS: 300.0000 mg | ORAL_TABLET | Freq: Every day | ORAL | Status: DC

## 2015-01-17 NOTE — Progress Notes (Signed)
Subjective:  [name] is a 24 y.o. [id number] at 3640w3d being seen today for ongoing prenatal care.  She is currently monitored for the following issues for this high-risk pregnancy and has Morbid obesity with BMI of 45.0-49.9, adult (HCC); Herpes simplex type 2 infection; Smoker; and Supervision of low-risk pregnancy on her problem list.  Patient reports heartburn.  Contractions: Not present. Vag. Bleeding: None.   . Denies leaking of fluid.   The following portions of the patient's history were reviewed and updated as appropriate: allergies, current medications, past family history, past medical history, past social history, past surgical history and problem list. Problem list updated.  Objective:   Filed Vitals:   01/17/15 1001  BP: 115/54  Pulse: 84  Temp: 97.8 F (36.6 C)  Weight: 302 lb (136.986 kg)    Fetal Status: Fetal Heart Rate (bpm): 151         General:  Alert, oriented and cooperative. Patient is in no acute distress.  Skin: Skin is warm and dry. No rash noted.   Cardiovascular: Normal heart rate noted  Respiratory: Normal respiratory effort, no problems with respiration noted  Abdomen: Soft, gravid, appropriate for gestational age. Pain/Pressure: Absent     Pelvic: Vag. Bleeding: None     Cervical exam deferred        Extremities: Normal range of motion.  Edema: None  Mental Status: Normal mood and affect. Normal behavior. Normal judgment and thought content.   Urinalysis: Urine Protein: Negative Urine Glucose: Negative  Assessment and Plan:  Pregnancy: [id number] at 5040w3d  1. Supervision of low-risk pregnancy, first trimester   2. Gastroesophageal reflux disease without esophagitis   Preterm labor symptoms and general obstetric precautions including but not limited to vaginal bleeding, contractions, leaking of fluid and fetal movement were reviewed in detail with the patient. Please refer to After Visit Summary for other counseling recommendations.   Return in about 4 weeks (around 02/14/2015).   Rhea PinkLori A Leroi Haque, CNM

## 2015-01-17 NOTE — Patient Instructions (Signed)
Food Choices for Gastroesophageal Reflux Disease, Adult When you have gastroesophageal reflux disease (GERD), the foods you eat and your eating habits are very important. Choosing the right foods can help ease your discomfort.  WHAT GUIDELINES DO I NEED TO FOLLOW?   Choose fruits, vegetables, whole grains, and low-fat dairy products.   Choose low-fat meat, fish, and poultry.  Limit fats such as oils, salad dressings, butter, nuts, and avocado.   Keep a food diary. This helps you identify foods that cause symptoms.   Avoid foods that cause symptoms. These may be different for everyone.   Eat small meals often instead of 3 large meals a day.   Eat your meals slowly, in a place where you are relaxed.   Limit fried foods.   Cook foods using methods other than frying.   Avoid drinking alcohol.   Avoid drinking large amounts of liquids with your meals.   Avoid bending over or lying down until 2-3 hours after eating.  WHAT FOODS ARE NOT RECOMMENDED?  These are some foods and drinks that may make your symptoms worse: Vegetables Tomatoes. Tomato juice. Tomato and spaghetti sauce. Chili peppers. Onion and garlic. Horseradish. Fruits Oranges, grapefruit, and lemon (fruit and juice). Meats High-fat meats, fish, and poultry. This includes hot dogs, ribs, ham, sausage, salami, and bacon. Dairy Whole milk and chocolate milk. Sour cream. Cream. Butter. Ice cream. Cream cheese.  Drinks Coffee and tea. Bubbly (carbonated) drinks or energy drinks. Condiments Hot sauce. Barbecue sauce.  Sweets/Desserts Chocolate and cocoa. Donuts. Peppermint and spearmint. Fats and Oils High-fat foods. This includes [place] fries and potato chips. Other Vinegar. Strong spices. This includes black pepper, white pepper, red pepper, cayenne, curry powder, cloves, ginger, and chili powder. The items listed above may not be a complete list of foods and drinks to avoid. Contact your dietitian for more  information.   This information is not intended to replace advice given to you by your health care provider. Make sure you discuss any questions you have with your health care provider.   Document Released: 07/09/2011 Document Revised: 01/28/2014 Document Reviewed: 11/11/2012 Elsevier Interactive Patient Education 2016 Elsevier Inc. Heartburn During Pregnancy Heartburn is a burning sensation in the chest caused by stomach acid backing up into the esophagus. Heartburn is common in pregnancy because a certain hormone (progesterone) is released when a woman is pregnant. The progesterone hormone may relax the valve that separates the esophagus from the stomach. This allows acid to go up into the esophagus, causing heartburn. Heartburn may also happen in pregnancy because the enlarging uterus pushes up on the stomach, which pushes more acid into the esophagus. This is especially true in the later stages of pregnancy. Heartburn problems usually go away after giving birth. CAUSES  Heartburn is caused by stomach acid backing up into the esophagus. During pregnancy, this may result from various things, including:   The progesterone hormone.  Changing hormone levels.  The growing uterus pushing stomach acid upward.  Large meals.  Certain foods and drinks.  Exercise.  Increased acid production. SIGNS AND SYMPTOMS   Burning pain in the chest or lower throat.  Bitter taste in the mouth.  Coughing. DIAGNOSIS  Your health care provider will typically diagnose heartburn by taking a careful history of your concern. Blood tests may be done to check for a certain type of bacteria that is associated with heartburn. Sometimes, heartburn is diagnosed by prescribing a heartburn medicine to see if the symptoms improve. In some cases, a  procedure called an endoscopy may be done. In this procedure, a tube with a light and a camera on the end (endoscope) is used to examine the esophagus and the  stomach. TREATMENT  Treatment will vary depending on the severity of your symptoms. Your health care provider may recommend:  Over-the-counter medicines (antacids, acid reducers) for mild heartburn.  Prescription medicines to decrease stomach acid or to protect your stomach lining.  Certain changes in your diet.  Elevating the head of your bed by putting blocks under the legs. This helps prevent stomach acid from backing up into the esophagus when you are lying down. HOME CARE INSTRUCTIONS   Only take over-the-counter or prescription medicines as directed by your health care provider.  Raise the head of your bed by putting blocks under the legs if instructed to do so by your health care provider. Sleeping with more pillows is not effective because it only changes the position of your head.  Do not exercise right after eating.  Avoid eating 2-3 hours before bed. Do not lie down right after eating.  Eat small meals throughout the day instead of three large meals.  Identify foods and beverages that make your symptoms worse and avoid them. Foods you may want to avoid include:  Peppers.  Chocolate.  High-fat foods, including fried foods.  Spicy foods.  Garlic and onions.  Citrus fruits, including oranges, grapefruit, lemons, and limes.  Food containing tomatoes or tomato products.  Mint.  Carbonated and caffeinated drinks.  Vinegar. SEEK MEDICAL CARE IF:  You have abdominal pain of any kind.  You feel burning in your upper abdomen or chest, especially after eating or lying down.  You have nausea and vomiting.  Your stomach feels upset after you eat. SEEK IMMEDIATE MEDICAL CARE IF:   You have severe chest pain that goes down your arm or into your jaw or neck.  You feel sweaty, dizzy, or light-headed.  You become short of breath.  You vomit blood.  You have difficulty or pain with swallowing.  You have bloody or black, tarry stools.  You have episodes of  heartburn more than 3 times a week, for more than 2 weeks. MAKE SURE YOU:  Understand these instructions.  Will watch your condition.  Will get help right away if you are not doing well or get worse.   This information is not intended to replace advice given to you by your health care provider. Make sure you discuss any questions you have with your health care provider.   Document Released: 01/05/2000 Document Revised: 01/28/2014 Document Reviewed: 08/26/2012 Elsevier Interactive Patient Education [employer]2016 Elsevier Inc.

## 2015-01-17 NOTE — Progress Notes (Signed)
Patient reports throwing up blood & terrible acid reflux. Patient states the acid reflux keeps her from eating; has tried OTC tums but has received no relief

## 2015-01-18 NOTE — Progress Notes (Signed)
   Subjective:    [name] is a [id number] [id number] being seen today for her first obstetrical visit.  Her obstetrical history is significant for multiparous. Patient does intend to breast feed. Pregnancy history fully reviewed.  Patient reports no complaints.  Filed Vitals:   12/19/14 1035  BP: 113/60  Pulse: 82  Weight: 137.893 kg (304 lb)    HISTORY: OB History  Gravida Para Term Preterm AB SAB TAB Ectopic Multiple Living  4 2 2  0 1 1 0 0 0 2    # Outcome Date GA Lbr Len/2nd Weight Sex Delivery Anes PTL Lv  4 Current           3 Term 12/08/12 6850w6d 09:45 / 00:14 2.57 kg (5 lb 10.7 oz) F Vag-Spont EPI  Y  2 Term 05/02/11 6863w1d  2.679 kg (5 lb 14.5 oz) F Vag-Spont EPI  Y     Comments: nasal bifid  1 SAB              Past Medical History  Diagnosis Date  . Asthma   . Gonorrhea 2011  . Chlamydia 2011  . Vaginal Pap smear, abnormal    Past Surgical History  Procedure Laterality Date  . Knee surgery  2007   Family History  Problem Relation Age of Onset  . Anesthesia problems Neg Hx   . Diabetes Maternal Grandmother   . Hypertension Maternal Grandmother      Exam    Uterus:     Pelvic Exam:    Perineum: No Hemorrhoids   Vulva: normal   Vagina:  normal mucosa   pH:     Cervix: no lesions   Adnexa: normal adnexa   Bony Pelvis: average  System: Breast:  normal appearance, no masses or tenderness   Skin: normal coloration and turgor, no rashes    Neurologic: oriented, normal mood   Extremities: normal strength, tone, and muscle mass   HEENT extra ocular movement intact   Mouth/Teeth mucous membranes moist, pharynx normal without lesions   Neck supple   Cardiovascular: regular rate and rhythm   Respiratory:  appears well, vitals normal, no respiratory distress, acyanotic, normal RR   Abdomen: soft, non-tender; bowel sounds normal; no masses,  no organomegaly   Urinary: urethral meatus normal      Assessment:    Pregnancy: [id number] Patient Active  Problem List   Diagnosis Date Noted  . Supervision of low-risk pregnancy 12/19/2014  . Smoker 12/08/2012  . 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 Morbid obesity with BMI of 45.0-49.9, adult (HCC) 03/25/2011        Plan:     Initial labs drawn. Prenatal vitamins. Problem list reviewed and updated. Genetic Screening discussed First Screen: undecided.  Ultrasound discussed; fetal survey: 18+ weeks.  Follow up in 4 weeks. 50% of 30 min visit spent on counseling and coordination of care.     ARNOLD,JAMES 01/18/2015

## 2015-01-20 ENCOUNTER — Other Ambulatory Visit: Payer: Self-pay | Admitting: Obstetrics & Gynecology

## 2015-01-22 NOTE — L&D Delivery Note (Signed)
Delivery Note At 10:01 AM a viable female was delivered via  (Presentation: ROA).  APGAR: 9, 9; weight pending.   Placenta status: spontaneous, intact.  Cord: 3vc.  With the following complications: none.    Anesthesia:  epidural Episiotomy:  none Lacerations:  none Est. Blood Loss (mL): 150mL   Mom to postpartum.  Baby to Couplet care / Skin to Skin.  [name] JEHIEL 07/20/2015, 10:20 AM

## 2015-01-23 ENCOUNTER — Other Ambulatory Visit: Payer: Self-pay

## 2015-01-23 DIAGNOSIS — Z3491 Encounter for supervision of normal pregnancy, unspecified, first trimester: Secondary | ICD-10-CM

## 2015-01-23 MED ORDER — ONDANSETRON 4 MG PO TBDP: 4.0000 mg | ORAL_TABLET | Freq: Four times a day (QID) | ORAL | Status: DC | PRN

## 2015-02-15 ENCOUNTER — Encounter: Payer: Medicaid Other | Admitting: Certified Nurse Midwife

## 2015-02-22 ENCOUNTER — Ambulatory Visit (INDEPENDENT_AMBULATORY_CARE_PROVIDER_SITE_OTHER): Payer: Medicaid Other | Admitting: Advanced Practice Midwife

## 2015-02-22 VITALS — BP 120/61 | HR 54 | Temp 97.6°F | Wt 293.7 lb

## 2015-02-22 DIAGNOSIS — O26892 Other specified pregnancy related conditions, second trimester: Secondary | ICD-10-CM

## 2015-02-22 DIAGNOSIS — Z3492 Encounter for supervision of normal pregnancy, unspecified, second trimester: Secondary | ICD-10-CM

## 2015-02-22 DIAGNOSIS — R51 Headache: Secondary | ICD-10-CM | POA: Diagnosis not present

## 2015-02-22 LAB — POCT URINALYSIS DIP (DEVICE)
GLUCOSE, UA: NEGATIVE mg/dL
Ketones, ur: NEGATIVE mg/dL
Nitrite: NEGATIVE
Protein, ur: 30 mg/dL — AB
SPECIFIC GRAVITY, URINE: 1.02 (ref 1.005–1.030)
Urobilinogen, UA: 1 mg/dL (ref 0.0–1.0)
pH: 8.5 — ABNORMAL HIGH (ref 5.0–8.0)

## 2015-02-22 NOTE — Progress Notes (Signed)
Pt reports frequent headaches @ work. She works at a [employer]. [place] for anatomy scheduled

## 2015-02-22 NOTE — Progress Notes (Signed)
Subjective:  [name] is a 25 y.o. Z6X0960 at [redacted]w[redacted]d being seen today for ongoing prenatal care.  She is currently monitored for the following issues for this low-risk pregnancy and has Morbid obesity with BMI of 45.0-49.9, adult (HCC); Herpes simplex type 2 infection; Smoker; and Supervision of low-risk pregnancy on her problem list.  Patient reports headache off and on especially while at work, none today.  Contractions: Not present. Vag. Bleeding: None.  Movement: Present. Denies leaking of fluid.   The following portions of the patient's history were reviewed and updated as appropriate: allergies, current medications, past family history, past medical history, past social history, past surgical history and problem list. Problem list updated.  Objective:   Filed Vitals:   02/22/15 1100  BP: 120/61  Pulse: 54  Temp: 97.6 F (36.4 C)  Weight: 293 lb 11.2 oz (133.221 kg)    Fetal Status: Fetal Heart Rate (bpm): 143 Fundal Height: 20 cm Movement: Present     General:  Alert, oriented and cooperative. Patient is in no acute distress.  Skin: Skin is warm and dry. No rash noted.   Cardiovascular: Normal heart rate noted  Respiratory: Normal respiratory effort, no problems with respiration noted  Abdomen: Soft, gravid, appropriate for gestational age. Pain/Pressure: Absent     Pelvic: Vag. Bleeding: None     Cervical exam deferred        Extremities: Normal range of motion.     Mental Status: Normal mood and affect. Normal behavior. Normal judgment and thought content.   Urinalysis: Urine Protein: 1+ Urine Glucose: Negative  Assessment and Plan:  Pregnancy: [id number] at [redacted]w[redacted]d  1. Supervision of normal pregnancy, second trimester  - [place] MFM OB COMP + 14 WK; Future - Culture, OB Urine  2. Headache in pregnancy, antepartum, second trimester --Dull frontal h/a's, sometimes with neck pain, resolve without treatment.  No n/v or light sensitivity.  Likely tension h/a. --Recommend  increase water intake, reduce tension with stretching/relaxation, Tylenol PRN.  F/U and let office know if h/a persist.   Preterm labor symptoms and general obstetric precautions including but not limited to vaginal bleeding, contractions, leaking of fluid and fetal movement were reviewed in detail with the patient. Please refer to After Visit Summary for other counseling recommendations.  Return in about 4 weeks (around 03/22/2015).   Hurshel Party, CNM

## 2015-02-23 LAB — CULTURE, OB URINE: Colony Count: 30000

## 2015-03-01 ENCOUNTER — Ambulatory Visit (HOSPITAL_COMMUNITY)
Admission: RE | Admit: 2015-03-01 | Discharge: 2015-03-01 | Disposition: A | Discharge status: Home / Self Care | Payer: Medicaid Other | Source: Ambulatory Visit | Attending: Advanced Practice Midwife | Admitting: Advanced Practice Midwife

## 2015-03-01 ENCOUNTER — Other Ambulatory Visit: Payer: Self-pay | Admitting: Advanced Practice Midwife

## 2015-03-01 DIAGNOSIS — Z3A19 19 weeks gestation of pregnancy: Secondary | ICD-10-CM | POA: Diagnosis not present

## 2015-03-01 DIAGNOSIS — Z3492 Encounter for supervision of normal pregnancy, unspecified, second trimester: Secondary | ICD-10-CM

## 2015-03-01 DIAGNOSIS — Z36 Encounter for antenatal screening of mother: Secondary | ICD-10-CM | POA: Insufficient documentation

## 2015-03-01 DIAGNOSIS — O99212 Obesity complicating pregnancy, second trimester: Secondary | ICD-10-CM | POA: Diagnosis not present

## 2015-03-01 DIAGNOSIS — O98512 Other viral diseases complicating pregnancy, second trimester: Secondary | ICD-10-CM | POA: Diagnosis not present

## 2015-03-01 DIAGNOSIS — O99332 Smoking (tobacco) complicating pregnancy, second trimester: Secondary | ICD-10-CM | POA: Diagnosis not present

## 2015-03-01 DIAGNOSIS — B009 Herpesviral infection, unspecified: Secondary | ICD-10-CM | POA: Diagnosis not present

## 2015-03-22 ENCOUNTER — Ambulatory Visit (INDEPENDENT_AMBULATORY_CARE_PROVIDER_SITE_OTHER): Payer: Medicaid Other | Admitting: Certified Nurse Midwife

## 2015-03-22 VITALS — BP 120/56 | HR 87 | Temp 97.9°F | Wt 296.8 lb

## 2015-03-22 DIAGNOSIS — Z6841 Body Mass Index (BMI) 40.0 and over, adult: Secondary | ICD-10-CM

## 2015-03-22 DIAGNOSIS — O99212 Obesity complicating pregnancy, second trimester: Secondary | ICD-10-CM

## 2015-03-22 DIAGNOSIS — O99332 Smoking (tobacco) complicating pregnancy, second trimester: Secondary | ICD-10-CM

## 2015-03-22 DIAGNOSIS — Z3492 Encounter for supervision of normal pregnancy, unspecified, second trimester: Secondary | ICD-10-CM

## 2015-03-22 DIAGNOSIS — F17200 Nicotine dependence, unspecified, uncomplicated: Secondary | ICD-10-CM

## 2015-03-22 DIAGNOSIS — F17210 Nicotine dependence, cigarettes, uncomplicated: Secondary | ICD-10-CM | POA: Diagnosis not present

## 2015-03-22 DIAGNOSIS — O98312 Other infections with a predominantly sexual mode of transmission complicating pregnancy, second trimester: Secondary | ICD-10-CM | POA: Diagnosis not present

## 2015-03-22 DIAGNOSIS — A6009 Herpesviral infection of other urogenital tract: Secondary | ICD-10-CM

## 2015-03-22 DIAGNOSIS — B009 Herpesviral infection, unspecified: Secondary | ICD-10-CM

## 2015-03-22 LAB — POCT URINALYSIS DIP (DEVICE)
Glucose, UA: 100 mg/dL — AB
Ketones, ur: NEGATIVE mg/dL
Nitrite: NEGATIVE
Protein, ur: 30 mg/dL — AB
Specific Gravity, Urine: 1.025 (ref 1.005–1.030)
Urobilinogen, UA: 4 mg/dL — ABNORMAL HIGH (ref 0.0–1.0)
pH: 7 (ref 5.0–8.0)

## 2015-03-22 NOTE — Progress Notes (Signed)
Urine Small amt wbcs, trace hgb Breastfeeding tip of the week reviewed

## 2015-03-22 NOTE — Progress Notes (Signed)
Subjective:  [name] is a 25 y.o. Z6X0960 at [redacted]w[redacted]d being seen today for ongoing prenatal care.  She is currently monitored for the following issues for this high-risk pregnancy and has Morbid obesity with BMI of 45.0-49.9, adult (HCC); Herpes simplex type 2 infection; Smoker; and Supervision of low-risk pregnancy on her problem list.  Patient reports no complaints.  Contractions: Not present. Vag. Bleeding: None.  Movement: Present. Denies leaking of fluid.   The following portions of the patient's history were reviewed and updated as appropriate: allergies, current medications, past family history, past medical history, past social history, past surgical history and problem list. Problem list updated.  Objective:   Filed Vitals:   03/22/15 1109  BP: 120/56  Pulse: 87  Temp: 97.9 F (36.6 C)  Weight: 296 lb 12.8 oz (134.628 kg)    Fetal Status: Fetal Heart Rate (bpm): 154   Movement: Present     General:  Alert, oriented and cooperative. Patient is in no acute distress.  Skin: Skin is warm and dry. No rash noted.   Cardiovascular: Normal heart rate noted  Respiratory: Normal respiratory effort, no problems with respiration noted  Abdomen: Soft, gravid, appropriate for gestational age. Pain/Pressure: Absent     Pelvic: Vag. Bleeding: None Vag D/C Character: Mucous   Cervical exam deferred        Extremities: Normal range of motion.  Edema: None  Mental Status: Normal mood and affect. Normal behavior. Normal judgment and thought content.   Urinalysis: Urine Protein: 1+ Urine Glucose: 1+  Assessment and Plan:  Pregnancy: [id number] at [redacted]w[redacted]d  1. Herpes simplex type 2 infection   2. Morbid obesity with BMI of 45.0-49.9, adult (HCC)   3. Smoker   4. Supervision of low-risk pregnancy, second trimester   Preterm labor symptoms and general obstetric precautions including but not limited to vaginal bleeding, contractions, leaking of fluid and fetal movement were reviewed  in detail with the patient. Please refer to After Visit Summary for other counseling recommendations.  Return in about 4 weeks (around 04/19/2015) for 1 hour gtt.   Rhea Pink, CNM

## 2015-03-22 NOTE — Patient Instructions (Signed)
Glucose Tolerance Test During Pregnancy The glucose tolerance test (GTT) is a blood test used to determine if you have developed a type of diabetes during pregnancy (gestational diabetes). This is when your body does not properly process sugar (glucose) in the food you eat, resulting in high blood glucose levels. Typically, a GTT is done after you have had a 1-hour glucose test with results that indicate you possibly have gestational diabetes. It may also be done if:  You have a history of giving birth to very large babies or have experienced repeated fetal loss (stillbirth).   You have signs and symptoms of diabetes, such as:   Changes in your vision.   Tingling or numbness in your hands or feet.   Changes in hunger, thirst, and urination not otherwise explained by your pregnancy.  The GTT lasts about 3 hours. You will be given a sugar-water solution to drink at the beginning of the test. You will have blood drawn before you drink the solution and then again 1, 2, and 3 hours after you drink it. You will not be allowed to eat or drink anything else during the test. You must remain at the testing location to make sure that your blood is drawn on time. You should also avoid exercising during the test, because exercise can alter test results. PREPARATION FOR TEST  Eat normally for 3 days prior to the GTT test, including having plenty of carbohydrate-rich foods. Do not eat or drink anything except water during the final 12 hours before the test. In addition, your health care provider may ask you to stop taking certain medicines before the test. RESULTS  It is your responsibility to obtain your test results. Ask the lab or department performing the test when and how you will get your results. Contact your health care provider to discuss any questions you have about your results.  Range of Normal Values Ranges for normal values may vary among different labs and hospitals. You should always check  with your health care provider after having lab work or other tests done to discuss whether your values are considered within normal limits. Normal levels of blood glucose are as follows:  Fasting: less than 105 mg/dL.   1 hour after drinking the solution: less than 190 mg/dL.   2 hours after drinking the solution: less than 165 mg/dL.   3 hours after drinking the solution: less than 145 mg/dL.  Some substances can interfere with GTT results. These may include:  Blood pressure and heart failure medicines, including beta blockers, furosemide, and thiazides.   Anti-inflammatory medicines, including aspirin.   Nicotine.   Some psychiatric medicines.  Meaning of Results Outside Normal Value Ranges GTT test results that are above normal values may indicate a number of health problems, such as:   Gestational diabetes.   Acute stress response.   Cushing syndrome.   Tumors such as pheochromocytoma or glucagonoma.   Long-term kidney problems.   Pancreatitis.   Hyperthyroidism.   Current infection.  Discuss your test results with your health care provider. He or she will use the results to make a diagnosis and determine a treatment plan that is right for you.   This information is not intended to replace advice given to you by your health care provider. Make sure you discuss any questions you have with your health care provider.   Document Released: 07/09/2011 Document Revised: 01/28/2014 Document Reviewed: 05/14/2013 Elsevier Interactive Patient Education 2016 Elsevier Inc.  

## 2015-04-04 ENCOUNTER — Emergency Department (HOSPITAL_COMMUNITY)
Admission: EM | Admit: 2015-04-04 | Discharge: 2015-04-04 | Disposition: A | Discharge status: Home / Self Care | Payer: Medicaid Other | Attending: Emergency Medicine | Admitting: Emergency Medicine

## 2015-04-04 ENCOUNTER — Encounter (HOSPITAL_COMMUNITY): Payer: Self-pay | Admitting: Emergency Medicine

## 2015-04-04 DIAGNOSIS — J029 Acute pharyngitis, unspecified: Secondary | ICD-10-CM | POA: Diagnosis present

## 2015-04-04 DIAGNOSIS — J069 Acute upper respiratory infection, unspecified: Secondary | ICD-10-CM | POA: Diagnosis not present

## 2015-04-04 DIAGNOSIS — Z79899 Other long term (current) drug therapy: Secondary | ICD-10-CM | POA: Diagnosis not present

## 2015-04-04 DIAGNOSIS — F17210 Nicotine dependence, cigarettes, uncomplicated: Secondary | ICD-10-CM | POA: Diagnosis not present

## 2015-04-04 DIAGNOSIS — Z8619 Personal history of other infectious and parasitic diseases: Secondary | ICD-10-CM | POA: Insufficient documentation

## 2015-04-04 DIAGNOSIS — J45909 Unspecified asthma, uncomplicated: Secondary | ICD-10-CM | POA: Diagnosis not present

## 2015-04-04 LAB — RAPID STREP SCREEN (MED CTR MEBANE ONLY): Streptococcus, Group A Screen (Direct): NEGATIVE

## 2015-04-04 MED ORDER — ACETAMINOPHEN 325 MG PO TABS
650.0000 mg | ORAL_TABLET | Freq: Once | ORAL | Status: AC | PRN
  Administered 2015-04-04: 650 mg via ORAL
  Filled 2015-04-04: qty 2

## 2015-04-04 MED ORDER — CHLORPHENIRAMINE MALEATE 4 MG PO TABS: 4.0000 mg | ORAL_TABLET | Freq: Two times a day (BID) | ORAL | Status: DC | PRN

## 2015-04-04 NOTE — Discharge Instructions (Signed)
Upper Respiratory Infection, Adult  Most upper respiratory infections (URIs) are a viral infection of the air passages leading to the lungs. A URI affects the nose, throat, and upper air passages. The most common type of URI is nasopharyngitis and is typically referred to as "the common cold."  URIs run their course and usually go away on their own. Most of the time, a URI does not require medical attention, but sometimes a bacterial infection in the upper airways can follow a viral infection. This is called a secondary infection. Sinus and middle ear infections are common types of secondary upper respiratory infections.  Bacterial pneumonia can also complicate a URI. A URI can worsen asthma and chronic obstructive pulmonary disease (COPD). Sometimes, these complications can require emergency medical care and may be life threatening.   CAUSES  Almost all URIs are caused by viruses. A virus is a type of germ and can spread from one person to another.   RISKS FACTORS  You may be at risk for a URI if:    You smoke.    You have chronic heart or lung disease.   You have a weakened defense (immune) system.    You are very young or very old.    You have nasal allergies or asthma.   You work in crowded or poorly ventilated areas.   You work in health care facilities or schools.  SIGNS AND SYMPTOMS   Symptoms typically develop 2-3 days after you come in contact with a cold virus. Most viral URIs last 7-10 days. However, viral URIs from the influenza virus (flu virus) can last 14-18 days and are typically more severe. Symptoms may include:    Runny or stuffy (congested) nose.    Sneezing.    Cough.    Sore throat.    Headache.    Fatigue.    Fever.    Loss of appetite.    Pain in your forehead, behind your eyes, and over your cheekbones (sinus pain).   Muscle aches.   DIAGNOSIS   Your health care provider may diagnose a URI by:   Physical exam.   Tests to check that your symptoms are not due to  another condition such as:   Strep throat.   Sinusitis.   Pneumonia.   Asthma.  TREATMENT   A URI goes away on its own with time. It cannot be cured with medicines, but medicines may be prescribed or recommended to relieve symptoms. Medicines may help:   Reduce your fever.   Reduce your cough.   Relieve nasal congestion.  HOME CARE INSTRUCTIONS    Take medicines only as directed by your health care provider.    Gargle warm saltwater or take cough drops to comfort your throat as directed by your health care provider.   Use a warm mist humidifier or inhale steam from a shower to increase air moisture. This may make it easier to breathe.   Drink enough fluid to keep your urine clear or pale yellow.    Eat soups and other clear broths and maintain good nutrition.    Rest as needed.    Return to work when your temperature has returned to normal or as your health care provider advises. You may need to stay home longer to avoid infecting others. You can also use a face mask and careful hand washing to prevent spread of the virus.   Increase the usage of your inhaler if you have asthma.    Do not   use any tobacco products, including cigarettes, chewing tobacco, or electronic cigarettes. If you need help quitting, ask your health care provider.  PREVENTION   The best way to protect yourself from getting a cold is to practice good hygiene.    Avoid oral or hand contact with people with cold symptoms.    Wash your hands often if contact occurs.   There is no clear evidence that vitamin C, vitamin E, echinacea, or exercise reduces the chance of developing a cold. However, it is always recommended to get plenty of rest, exercise, and practice good nutrition.   SEEK MEDICAL CARE IF:    You are getting worse rather than better.    Your symptoms are not controlled by medicine.    You have chills.   You have worsening shortness of breath.   You have brown or red mucus.   You have yellow or brown nasal  discharge.   You have pain in your face, especially when you bend forward.   You have a fever.   You have swollen neck glands.   You have pain while swallowing.   You have white areas in the back of your throat.  SEEK IMMEDIATE MEDICAL CARE IF:    You have severe or persistent:    Headache.    Ear pain.    Sinus pain.    Chest pain.   You have chronic lung disease and any of the following:    Wheezing.    Prolonged cough.    Coughing up blood.    A change in your usual mucus.   You have a stiff neck.   You have changes in your:    Vision.    Hearing.    Thinking.    Mood.  MAKE SURE YOU:    Understand these instructions.   Will watch your condition.   Will get help right away if you are not doing well or get worse.     This information is not intended to replace advice given to you by your health care provider. Make sure you discuss any questions you have with your health care provider.     Document Released: 07/03/2000 Document Revised: 05/24/2014 Document Reviewed: 04/14/2013  Elsevier Interactive Patient Education 2016 [employer]      Sinusitis, Adult  Sinusitis is redness, soreness, and inflammation of the paranasal sinuses. Paranasal sinuses are air pockets within the bones of your face. They are located beneath your eyes, in the middle of your forehead, and above your eyes. In healthy paranasal sinuses, mucus is able to drain out, and air is able to circulate through them by way of your nose. However, when your paranasal sinuses are inflamed, mucus and air can become trapped. This can allow bacteria and other germs to grow and cause infection.  Sinusitis can develop quickly and last only a short time (acute) or continue over a long period (chronic). Sinusitis that lasts for more than 12 weeks is considered chronic.  CAUSES  Causes of sinusitis include:   Allergies.   Structural abnormalities, such as displacement of the cartilage that separates your nostrils (deviated septum), which can  decrease the air flow through your nose and sinuses and affect sinus drainage.   Functional abnormalities, such as when the small hairs (cilia) that line your sinuses and help remove mucus do not work properly or are not present.  SIGNS AND SYMPTOMS  Symptoms of acute and chronic sinusitis are the same. The primary symptoms are pain and   pressure around the affected sinuses. Other symptoms include:   Upper toothache.   Earache.   Headache.   Bad breath.   Decreased sense of smell and taste.   A cough, which worsens when you are lying flat.   Fatigue.   Fever.   Thick drainage from your nose, which often is green and may contain pus (purulent).   Swelling and warmth over the affected sinuses.  DIAGNOSIS  Your health care provider will perform a physical exam. During your exam, your health care provider may perform any of the following to help determine if you have acute sinusitis or chronic sinusitis:   Look in your nose for signs of abnormal growths in your nostrils (nasal polyps).   Tap over the affected sinus to check for signs of infection.   View the inside of your sinuses using an imaging device that has a light attached (endoscope).  If your health care provider suspects that you have chronic sinusitis, one or more of the following tests may be recommended:   Allergy tests.   Nasal culture. A sample of mucus is taken from your nose, sent to a lab, and screened for bacteria.   Nasal cytology. A sample of mucus is taken from your nose and examined by your health care provider to determine if your sinusitis is related to an allergy.  TREATMENT  Most cases of acute sinusitis are related to a viral infection and will resolve on their own within 10 days. Sometimes, medicines are prescribed to help relieve symptoms of both acute and chronic sinusitis. These may include pain medicines, decongestants, nasal steroid sprays, or saline sprays.  However, for sinusitis related to a bacterial infection, your  health care provider will prescribe antibiotic medicines. These are medicines that will help kill the bacteria causing the infection.  Rarely, sinusitis is caused by a fungal infection. In these cases, your health care provider will prescribe antifungal medicine.  For some cases of chronic sinusitis, surgery is needed. Generally, these are cases in which sinusitis recurs more than 3 times per year, despite other treatments.  HOME CARE INSTRUCTIONS   Drink plenty of water. Water helps thin the mucus so your sinuses can drain more easily.   Use a humidifier.   Inhale steam 3-4 times a day (for example, sit in the bathroom with the shower running).   Apply a warm, moist washcloth to your face 3-4 times a day, or as directed by your health care provider.   Use saline nasal sprays to help moisten and clean your sinuses.   Take medicines only as directed by your health care provider.   If you were prescribed either an antibiotic or antifungal medicine, finish it all even if you start to feel better.  SEEK IMMEDIATE MEDICAL CARE IF:   You have increasing pain or severe headaches.   You have nausea, vomiting, or drowsiness.   You have swelling around your face.   You have vision problems.   You have a stiff neck.   You have difficulty breathing.     This information is not intended to replace advice given to you by your health care provider. Make sure you discuss any questions you have with your health care provider.     Document Released: 01/07/2005 Document Revised: 01/28/2014 Document Reviewed: 01/22/2011  Elsevier Interactive Patient Education 2016 [employer]vier Inc[employer]

## 2015-04-04 NOTE — ED Notes (Signed)
Patient complaining of a cold with a sore throat and headache. Patient states this started today.

## 2015-04-04 NOTE — ED Provider Notes (Signed)
CSN: [id number]     Arrival date & time 04/04/15  2023 History  By signing my name below, I, Ronney Lion, attest that this documentation has been prepared under the direction and in the presence of Marlon Pel, PA-C. Electronically Signed: Ronney Lion, ED Scribe. 04/04/2015. 10:16 PM.    Chief Complaint  Patient presents with  . Sore Throat  . Headache  . Nasal Congestion   The history is provided by the patient. No language interpreter was used.   HPI Comments: [name] is a 25 y.o. female who is currently [redacted]w[redacted]d pregnant, with a history of asthma, who presents to the Emergency Department with multiple complaints, cough, including a gradual-onset, constant, gradually worsening generalized headache, worse to the front of her face and sinus congestion that began today. She also complains of associated cough, mild rhinorrhea, nasal congestion, and sore throat. Nobody else at home is sick. No medications PTA were noted; however, patient states that the Tylenol given to her in triage provided no relief to her headache. She denies vomiting, fever, or diarrhea. Patient reports NKDA.   Past Medical History  Diagnosis Date  . Asthma   . Gonorrhea 2011  . Chlamydia 2011  . Vaginal Pap smear, abnormal    Past Surgical History  Procedure Laterality Date  . Knee surgery  2007   Family History  Problem Relation Age of Onset  . Anesthesia problems Neg Hx   . Diabetes Maternal Grandmother   . Hypertension Maternal Grandmother    Social History  Substance Use Topics  . Smoking status: Current Every Day Smoker -- 0.25 packs/day for 5 years    Types: Cigarettes  . Smokeless tobacco: Never Used  . Alcohol Use: No     Comment: Quit: 03/17/12   OB History    Gravida Para Term Preterm AB TAB SAB Ectopic Multiple Living   0 1 0 1 0 0 2     Review of Systems  A complete 10 system review of systems was obtained and all systems are negative except as noted in the HPI and PMH.     Allergies  Review of patient's allergies indicates no known allergies.  Home Medications   Prior to Admission medications   Medication Sig Start Date End Date Taking? Authorizing Provider  chlorpheniramine (CHLOR-TRIMETON) 4 MG tablet Take 1 tablet (4 mg total) by mouth 2 (two) times daily as needed for allergies. 04/04/15   Milia Warth Neva Seat, PA-C  metoCLOPramide (REGLAN) 10 MG tablet Take 1 tablet (10 mg total) by mouth 4 (four) times daily. Patient not taking: Reported on 02/22/2015 01/17/15   Lawson Fiscal A Clemmons, CNM  ondansetron (ZOFRAN ODT) 4 MG disintegrating tablet Take 1 tablet (4 mg total) by mouth every 6 (six) hours as needed for nausea. Patient not taking: Reported on 03/22/2015 01/23/15   Rhona Raider Stinson, DO  ondansetron (ZOFRAN-ODT) 4 MG disintegrating tablet TAKE 1 TABLET BY MOUTH EVERY 6 HOURS AS NEEDED FOR NAUSEA Patient not taking: Reported on 03/22/2015 01/24/15   Adam Phenix, MD  Prenatal MV-Min-Fe Cbn-FA-DHA (PRENATAL PLUS DHA) 7-0.4-100 MG TABS Take 1 tablet by mouth daily. 12/19/14   Adam Phenix, MD  ranitidine (ZANTAC) 300 MG tablet Take 1 tablet (300 mg total) by mouth at bedtime. Patient not taking: Reported on 03/22/2015 01/17/15   Elmore Guise Clemmons, CNM   BP 149/85 mmHg  Pulse 105  Temp(Src) 98.3 F (36.8 C) (Oral)  Resp 20  Ht 5' 6.5" (1.689 m)  Wt 133.811 kg  BMI 46.91 kg/m2  SpO2 99%  LMP 10/15/2014 Physical Exam  Constitutional: She is oriented to person, place, and time. She appears well-developed and well-nourished. No distress.  HENT:  Head: Normocephalic and atraumatic.  Nose: Rhinorrhea and sinus tenderness present. No epistaxis. Right sinus exhibits maxillary sinus tenderness and frontal sinus tenderness. Left sinus exhibits maxillary sinus tenderness and frontal sinus tenderness.  Mouth/Throat: Uvula is midline, oropharynx is clear and moist and mucous membranes are normal.  Eyes: Conjunctivae and EOM are normal.  Neck: Neck supple. No tracheal deviation  present. No Brudzinski's sign and no Kernig's sign noted.  Cardiovascular: Normal rate.   Pulmonary/Chest: Effort normal and breath sounds normal. No respiratory distress. She has no decreased breath sounds. She has no wheezes. She has no rhonchi.  Coughing during exam  Musculoskeletal: Normal range of motion.  Neurological: She is alert and oriented to person, place, and time.  Cranial nerves grossly intact on exam. Pt alert and oriented x 3 and physiologic Normal muscular tone No facial droop  Coordination intact, no limb ataxia,No pronator drift  Skin: Skin is warm and dry.  Psychiatric: She has a normal mood and affect. Her behavior is normal.  Nursing note and vitals reviewed.   ED Course  Procedures (including critical care time)  DIAGNOSTIC STUDIES: Oxygen Saturation is 99% on RA, normal by my interpretation.    COORDINATION OF CARE: 10:09 PM - Rapid strep screen results reviewed with pt. Discussed treatment plan with pt at bedside which includes symptomatic treatment. Will give list of safe OTC medications for pregnancy. Advised follow-up with obstetrician in several days. Pt verbalized understanding and agreed to plan.   Labs Review Labs Reviewed  [facility])  CULTURE, GROUP A STREP [facility]    MDM   Final diagnoses:  URI (upper respiratory infection)   Normal vital signs, not hypertensive.  Well appearing.   Mild to moderate symptoms of clear/yellow nasal discharge/congestion and sore throat with cough for less than 10 days.  Patient is afebrile.  No concern for acute bacterial rhinosinusitis; likely viral in nature.  Patient discharged with symptomatic treatment.  Given that patient is pregnancy, given recommendations for follow-up with obstetrician.   Discussed return precautions.  chlorpheniramine (CHLOR-TRIMETON) 4 MG tablet Take 1 tablet (4 mg total) by mouth 2 (two) times daily as needed for allergies. 14 tablet Marlon Peliffany Kareen Jefferys, PA-C    I  personally performed the services described in this documentation, which was scribed in my presence. The recorded information has been reviewed and is accurate.    Marlon Peliffany Keirstin Musil, PA-C 04/04/15 2225  Donnetta HutchingBrian Cook, MD 04/05/15 [phone]

## 2015-04-04 NOTE — ED Notes (Signed)
PA at bedside.

## 2015-04-07 LAB — CULTURE, GROUP A STREP (THRC)

## 2015-04-19 ENCOUNTER — Ambulatory Visit (INDEPENDENT_AMBULATORY_CARE_PROVIDER_SITE_OTHER): Payer: Medicaid Other | Admitting: Family

## 2015-04-19 VITALS — BP 119/69 | HR 85 | Temp 98.5°F | Wt 295.8 lb

## 2015-04-19 DIAGNOSIS — Z1389 Encounter for screening for other disorder: Secondary | ICD-10-CM

## 2015-04-19 DIAGNOSIS — Z23 Encounter for immunization: Secondary | ICD-10-CM | POA: Diagnosis not present

## 2015-04-19 DIAGNOSIS — O99332 Smoking (tobacco) complicating pregnancy, second trimester: Secondary | ICD-10-CM

## 2015-04-19 DIAGNOSIS — Z3492 Encounter for supervision of normal pregnancy, unspecified, second trimester: Secondary | ICD-10-CM | POA: Diagnosis present

## 2015-04-19 DIAGNOSIS — Z363 Encounter for antenatal screening for malformations: Secondary | ICD-10-CM

## 2015-04-19 DIAGNOSIS — F17210 Nicotine dependence, cigarettes, uncomplicated: Secondary | ICD-10-CM

## 2015-04-19 DIAGNOSIS — O99212 Obesity complicating pregnancy, second trimester: Secondary | ICD-10-CM

## 2015-04-19 LAB — CBC
HCT: 34 % — ABNORMAL LOW (ref 36.0–46.0)
Hemoglobin: 11.2 g/dL — ABNORMAL LOW (ref 12.0–15.0)
MCH: 25.2 pg — AB (ref 26.0–34.0)
MCHC: 32.9 g/dL (ref 30.0–36.0)
MCV: 76.4 fL — ABNORMAL LOW (ref 78.0–100.0)
MPV: 10.7 fL (ref 8.6–12.4)
Platelets: 307 10*3/uL (ref 150–400)
RBC: 4.45 MIL/uL (ref 3.87–5.11)
RDW: 15.2 % (ref 11.5–15.5)
WBC: 10.5 10*3/uL (ref 4.0–10.5)

## 2015-04-19 LAB — POCT URINALYSIS DIP (DEVICE)
BILIRUBIN URINE: NEGATIVE
Glucose, UA: NEGATIVE mg/dL
KETONES UR: 15 mg/dL — AB
Nitrite: NEGATIVE
PH: 7 (ref 5.0–8.0)
Protein, ur: NEGATIVE mg/dL
Specific Gravity, Urine: 1.02 (ref 1.005–1.030)
Urobilinogen, UA: 1 mg/dL (ref 0.0–1.0)

## 2015-04-19 NOTE — Progress Notes (Signed)
Subjective:  [name] is a 25 y.o. [id number] at 1456w4d being seen today for ongoing prenatal care.  She is currently monitored for the following issues for this low-risk pregnancy and has Morbid obesity with BMI of 45.0-49.9, adult (HCC); Herpes simplex type 2 infection; Smoker; and Supervision of low-risk pregnancy on her problem list.  Patient reports no complaints.  Contractions: Not present.  .  Movement: Present. Denies leaking of fluid.   The following portions of the patient's history were reviewed and updated as appropriate: allergies, current medications, past family history, past medical history, past social history, past surgical history and problem list. Problem list updated.  Objective:   Filed Vitals:   04/19/15 0949  BP: 119/69  Pulse: 85  Temp: 98.5 F (36.9 C)  Weight: 295 lb 12.8 oz (134.174 kg)    Fetal Status: Fetal Heart Rate (bpm): 152 Fundal Height: 28 cm Movement: Present     General:  Alert, oriented and cooperative. Patient is in no acute distress.  Skin: Skin is warm and dry. No rash noted.   Cardiovascular: Normal heart rate noted  Respiratory: Normal respiratory effort, no problems with respiration noted  Abdomen: Soft, gravid, appropriate for gestational age. Pain/Pressure: Absent     Pelvic:       Cervical exam deferred        Extremities: Normal range of motion.  Edema: None  Mental Status: Normal mood and affect. Normal behavior. Normal judgment and thought content.   Urinalysis: Urine Protein: Negative Urine Glucose: Negative  Assessment and Plan:  Pregnancy: [id number] at 8256w4d  1. Need for Tdap vaccination - Tdap vaccine greater than or equal to 7yo IM  2. Encounter for routine screening for malformation using ultrasonics - US MFM OB FOLLOW UP; Future > limited view   3. Supervision of low-risk pregnancy, second trimester - HIV antibody - CBC - RPR - Glucose Tolerance, 1 HR (50g)  Preterm labor symptoms and general obstetric  precautions including but not limited to vaginal bleeding, contractions, leaking of fluid and fetal movement were reviewed in detail with the patient. Please refer to After Visit Summary for other counseling recommendations.  Return in about 2 weeks (around 05/03/2015).   Eino FarberWalidah Kennith GainN Karim, CNM

## 2015-04-19 NOTE — Progress Notes (Signed)
Ob f/u US scheduled for April 11th @ 1000.  Pt notified.

## 2015-04-20 LAB — HIV ANTIBODY (ROUTINE TESTING W REFLEX): HIV: NONREACTIVE

## 2015-04-20 LAB — GLUCOSE TOLERANCE, 1 HOUR (50G) W/O FASTING: GLUCOSE, 1 HR, GESTATIONAL: 96 mg/dL (ref <140)

## 2015-04-22 LAB — RPR

## 2015-05-02 ENCOUNTER — Other Ambulatory Visit: Payer: Self-pay | Admitting: Family

## 2015-05-02 ENCOUNTER — Ambulatory Visit (HOSPITAL_COMMUNITY)
Admission: RE | Admit: 2015-05-02 | Discharge: 2015-05-02 | Disposition: A | Discharge status: Home / Self Care | Payer: Medicaid Other | Source: Ambulatory Visit | Attending: Family | Admitting: Family

## 2015-05-02 DIAGNOSIS — IMO0002 Reserved for concepts with insufficient information to code with codable children: Secondary | ICD-10-CM

## 2015-05-02 DIAGNOSIS — B009 Herpesviral infection, unspecified: Secondary | ICD-10-CM

## 2015-05-02 DIAGNOSIS — O09293 Supervision of pregnancy with other poor reproductive or obstetric history, third trimester: Secondary | ICD-10-CM | POA: Diagnosis not present

## 2015-05-02 DIAGNOSIS — Z3A28 28 weeks gestation of pregnancy: Secondary | ICD-10-CM | POA: Diagnosis not present

## 2015-05-02 DIAGNOSIS — Z3493 Encounter for supervision of normal pregnancy, unspecified, third trimester: Secondary | ICD-10-CM

## 2015-05-02 DIAGNOSIS — O98512 Other viral diseases complicating pregnancy, second trimester: Secondary | ICD-10-CM

## 2015-05-02 DIAGNOSIS — O98513 Other viral diseases complicating pregnancy, third trimester: Secondary | ICD-10-CM | POA: Diagnosis not present

## 2015-05-02 DIAGNOSIS — Z0489 Encounter for examination and observation for other specified reasons: Secondary | ICD-10-CM

## 2015-05-02 DIAGNOSIS — O99213 Obesity complicating pregnancy, third trimester: Secondary | ICD-10-CM

## 2015-05-02 DIAGNOSIS — O09292 Supervision of pregnancy with other poor reproductive or obstetric history, second trimester: Secondary | ICD-10-CM

## 2015-05-02 DIAGNOSIS — Z1389 Encounter for screening for other disorder: Secondary | ICD-10-CM

## 2015-05-02 DIAGNOSIS — Z36 Encounter for antenatal screening of mother: Secondary | ICD-10-CM | POA: Insufficient documentation

## 2015-05-02 DIAGNOSIS — O99333 Smoking (tobacco) complicating pregnancy, third trimester: Secondary | ICD-10-CM | POA: Diagnosis present

## 2015-05-04 ENCOUNTER — Ambulatory Visit (INDEPENDENT_AMBULATORY_CARE_PROVIDER_SITE_OTHER): Payer: Medicaid Other | Admitting: Family Medicine

## 2015-05-04 VITALS — BP 110/62 | HR 100 | Temp 98.2°F | Wt 293.8 lb

## 2015-05-04 DIAGNOSIS — Z3493 Encounter for supervision of normal pregnancy, unspecified, third trimester: Secondary | ICD-10-CM | POA: Diagnosis present

## 2015-05-04 DIAGNOSIS — J301 Allergic rhinitis due to pollen: Secondary | ICD-10-CM

## 2015-05-04 LAB — POCT URINALYSIS DIP (DEVICE)
GLUCOSE, UA: NEGATIVE mg/dL
Hgb urine dipstick: NEGATIVE
KETONES UR: NEGATIVE mg/dL
LEUKOCYTES UA: NEGATIVE
Nitrite: NEGATIVE
Protein, ur: 30 mg/dL — AB
SPECIFIC GRAVITY, URINE: 1.025 (ref 1.005–1.030)
Urobilinogen, UA: 2 mg/dL — ABNORMAL HIGH (ref 0.0–1.0)
pH: 6.5 (ref 5.0–8.0)

## 2015-05-04 MED ORDER — FLUTICASONE PROPIONATE 50 MCG/ACT NA SUSP: 1.0000 | Freq: Every day | NASAL | Status: DC

## 2015-05-04 NOTE — Progress Notes (Signed)
Subjective:  [name] is a 25 y.o. [id number] at 3461w5d being seen today for ongoing prenatal care.  She is currently monitored for the following issues for this low-risk pregnancy and has Morbid obesity with BMI of 45.0-49.9, adult (HCC); Herpes simplex type 2 infection; Smoker; and Supervision of low-risk pregnancy on her problem list.  Patient reports sinus congestion x 2 weeks, associated with allergies..  Contractions: Irritability. Vag. Bleeding: None.  Movement: Present. Denies leaking of fluid.   The following portions of the patient's history were reviewed and updated as appropriate: allergies, current medications, past family history, past medical history, past social history, past surgical history and problem list. Problem list updated.  Objective:   Filed Vitals:   05/04/15 1546  BP: 110/62  Pulse: 100  Temp: 98.2 F (36.8 C)  Weight: 293 lb 12.8 oz (133.267 kg)    Fetal Status: Fetal Heart Rate (bpm): 160   Movement: Present     General:  Alert, oriented and cooperative. Patient is in no acute distress.  Skin: Skin is warm and dry. No rash noted.   Cardiovascular: Normal heart rate noted  Respiratory: Normal respiratory effort, no problems with respiration noted  Abdomen: Soft, gravid, appropriate for gestational age. Pain/Pressure: Present     Pelvic: Vag. Bleeding: None Vag D/C Character: Mucous   Cervical exam deferred        Extremities: Normal range of motion.  Edema: Mild pitting, slight indentation  Mental Status: Normal mood and affect. Normal behavior. Normal judgment and thought content.   Urinalysis: Urine Protein: 1+ Urine Glucose: Negative  Assessment and Plan:  Pregnancy: [id number] at 7461w5d  1. Supervision of low-risk pregnancy, third trimester FHT and FH normal  2. Allergic rhinitis due to pollen Add flonase.  If this doesn't improve hoarseness, can try H2 blocker for reflux.     Preterm labor symptoms and general obstetric precautions  including but not limited to vaginal bleeding, contractions, leaking of fluid and fetal movement were reviewed in detail with the patient. Please refer to After Visit Summary for other counseling recommendations.  No Follow-up on file.   Levie HeritageJacob J Stinson, DO

## 2015-05-04 NOTE — Progress Notes (Signed)
Breastfeeding tip of the week reviewed. 

## 2015-05-23 ENCOUNTER — Ambulatory Visit (INDEPENDENT_AMBULATORY_CARE_PROVIDER_SITE_OTHER): Payer: Medicaid Other | Admitting: Advanced Practice Midwife

## 2015-05-23 ENCOUNTER — Encounter: Payer: Self-pay | Admitting: Advanced Practice Midwife

## 2015-05-23 ENCOUNTER — Encounter: Payer: Self-pay | Admitting: Family Medicine

## 2015-05-23 VITALS — BP 113/55 | HR 85 | Wt 291.0 lb

## 2015-05-23 DIAGNOSIS — A6009 Herpesviral infection of other urogenital tract: Secondary | ICD-10-CM

## 2015-05-23 DIAGNOSIS — O99213 Obesity complicating pregnancy, third trimester: Secondary | ICD-10-CM

## 2015-05-23 DIAGNOSIS — B009 Herpesviral infection, unspecified: Secondary | ICD-10-CM

## 2015-05-23 DIAGNOSIS — O99333 Smoking (tobacco) complicating pregnancy, third trimester: Secondary | ICD-10-CM

## 2015-05-23 DIAGNOSIS — O98313 Other infections with a predominantly sexual mode of transmission complicating pregnancy, third trimester: Secondary | ICD-10-CM

## 2015-05-23 DIAGNOSIS — Z3493 Encounter for supervision of normal pregnancy, unspecified, third trimester: Secondary | ICD-10-CM

## 2015-05-23 DIAGNOSIS — F17210 Nicotine dependence, cigarettes, uncomplicated: Secondary | ICD-10-CM

## 2015-05-23 DIAGNOSIS — O4703 False labor before 37 completed weeks of gestation, third trimester: Secondary | ICD-10-CM

## 2015-05-23 LAB — POCT URINALYSIS DIP (DEVICE)
Bilirubin Urine: NEGATIVE
Glucose, UA: NEGATIVE mg/dL
HGB URINE DIPSTICK: NEGATIVE
Ketones, ur: NEGATIVE mg/dL
Leukocytes, UA: NEGATIVE
NITRITE: NEGATIVE
PH: 6 (ref 5.0–8.0)
Protein, ur: 30 mg/dL — AB
Specific Gravity, Urine: >=1.03 (ref 1.005–1.030)
UROBILINOGEN UA: 1 mg/dL (ref 0.0–1.0)

## 2015-05-23 LAB — FETAL FIBRONECTIN: Fetal Fibronectin: NEGATIVE

## 2015-05-23 NOTE — Progress Notes (Signed)
Subjective:  [name] is a 25 y.o. [id number] at 4034w3d being seen today for ongoing prenatal care.  She is currently monitored for the following issues for this low-risk pregnancy and has Morbid obesity with BMI of 45.0-49.9, adult (HCC); Herpes simplex type 2 infection; Smoker; and Supervision of low-risk pregnancy on her problem list.  Patient reports contractions since 2-3 weeks.  Contractions: Not present.  .  Movement: Present. Denies leaking of fluid.   The following portions of the patient's history were reviewed and updated as appropriate: allergies, current medications, past family history, past medical history, past social history, past surgical history and problem list. Problem list updated.  Objective:   Filed Vitals:   05/23/15 1516  BP: 113/55  Pulse: 85  Weight: 291 lb (131.997 kg)    Fetal Status: Fetal Heart Rate (bpm): 132   Movement: Present     General:  Alert, oriented and cooperative. Patient is in no acute distress.  Skin: Skin is warm and dry. No rash noted.   Cardiovascular: Normal heart rate noted  Respiratory: Normal respiratory effort, no problems with respiration noted  Abdomen: Soft, gravid, appropriate for gestational age. Pain/Pressure: Present     Pelvic:       Cervical exam deferred        Extremities: Normal range of motion.     Mental Status: Normal mood and affect. Normal behavior. Normal judgment and thought content.   Urinalysis:      Assessment and Plan:  Pregnancy: [id number] at 4734w3d Long discussion of HSV + per blood test, never had a lesion (diagnosed per screening), recommend Valtrex at 34 wks  . Preterm labor symptoms and general obstetric precautions including but not limited to vaginal bleeding, contractions, leaking of fluid and fetal movement were reviewed in detail with the patient. Please refer to After Visit Summary for other counseling recommendations.  No Follow-up on file.   Aviva SignsMarie L Tarance Balan, CNM

## 2015-05-23 NOTE — Progress Notes (Signed)
Pt has some cramping at times

## 2015-05-23 NOTE — Patient Instructions (Signed)

## 2015-05-23 NOTE — Progress Notes (Unsigned)
  FFN is negative.

## 2015-06-06 ENCOUNTER — Ambulatory Visit (INDEPENDENT_AMBULATORY_CARE_PROVIDER_SITE_OTHER): Payer: Medicaid Other | Admitting: Family

## 2015-06-06 VITALS — BP 121/75 | HR 86 | Wt 290.0 lb

## 2015-06-06 DIAGNOSIS — B009 Herpesviral infection, unspecified: Secondary | ICD-10-CM

## 2015-06-06 DIAGNOSIS — Z3493 Encounter for supervision of normal pregnancy, unspecified, third trimester: Secondary | ICD-10-CM

## 2015-06-06 DIAGNOSIS — O98513 Other viral diseases complicating pregnancy, third trimester: Secondary | ICD-10-CM

## 2015-06-06 LAB — POCT URINALYSIS DIP (DEVICE)
BILIRUBIN URINE: NEGATIVE
Glucose, UA: NEGATIVE mg/dL
Ketones, ur: NEGATIVE mg/dL
LEUKOCYTES UA: NEGATIVE
NITRITE: NEGATIVE
PH: 7 (ref 5.0–8.0)
Protein, ur: 30 mg/dL — AB
SPECIFIC GRAVITY, URINE: 1.02 (ref 1.005–1.030)
Urobilinogen, UA: 2 mg/dL — ABNORMAL HIGH (ref 0.0–1.0)

## 2015-06-06 MED ORDER — VALACYCLOVIR HCL 1 G PO TABS: 1000.0000 mg | ORAL_TABLET | Freq: Every day | ORAL | Status: DC

## 2015-06-06 NOTE — Progress Notes (Signed)
Subjective:  [name] is a 25 y.o. [id number] at 3942w3d being seen today for ongoing prenatal care.  She is currently monitored for the following issues for this high-risk pregnancy and has Morbid obesity with BMI of 45.0-49.9, adult (HCC); Herpes simplex type 2 infection; Smoker; and Supervision of low-risk pregnancy on her problem list.  Patient reports no complaints.  Contractions: Not present. Vag. Bleeding: None.  Movement: Present. Denies leaking of fluid.   The following portions of the patient's history were reviewed and updated as appropriate: allergies, current medications, past family history, past medical history, past social history, past surgical history and problem list. Problem list updated.  Objective:   Filed Vitals:   06/06/15 1630  BP: 121/75  Pulse: 86  Weight: 290 lb (131.543 kg)    Fetal Status: Fetal Heart Rate (bpm): 154 Fundal Height: 34 cm Movement: Present     General:  Alert, oriented and cooperative. Patient is in no acute distress.  Skin: Skin is warm and dry. No rash noted.   Cardiovascular: Normal heart rate noted  Respiratory: Normal respiratory effort, no problems with respiration noted  Abdomen: Soft, gravid, appropriate for gestational age. Pain/Pressure: Present     Pelvic: Vag. Bleeding: None     Cervical exam deferred        Extremities: Normal range of motion.  Edema: Trace  Mental Status: Normal mood and affect. Normal behavior. Normal judgment and thought content.   Urinalysis: Urine Protein: 1+ Urine Glucose: Negative  Assessment and Plan:  Pregnancy: [id number] at 3442w3d  1. HSV-2 infection complicating pregnancy, third trimester - valACYclovir (VALTREX) 1000 MG tablet; Take 1 tablet (1,000 mg total) by mouth daily.  Dispense: 60 tablet; Refill: 0  2. Supervision of low-risk pregnancy, third trimester - Reviewed follow-up ultrasound > anatomy wnl  3. Herpes simplex type 2 infection - RX Valtrex  Preterm labor symptoms and  general obstetric precautions including but not limited to vaginal bleeding, contractions, leaking of fluid and fetal movement were reviewed in detail with the patient. Please refer to After Visit Summary for other counseling recommendations.  Return in about 2 weeks (around 06/20/2015).   Eino FarberWalidah Kennith GainN [name], CNM

## 2015-06-06 NOTE — Progress Notes (Signed)
Would like a refill on Valtrex.

## 2015-06-22 ENCOUNTER — Other Ambulatory Visit (HOSPITAL_COMMUNITY)
Admission: RE | Admit: 2015-06-22 | Discharge: 2015-06-22 | Disposition: A | Discharge status: Home / Self Care | Payer: Medicaid Other | Source: Ambulatory Visit | Attending: Obstetrics and Gynecology | Admitting: Obstetrics and Gynecology

## 2015-06-22 ENCOUNTER — Encounter: Payer: Self-pay | Admitting: Obstetrics and Gynecology

## 2015-06-22 ENCOUNTER — Ambulatory Visit (INDEPENDENT_AMBULATORY_CARE_PROVIDER_SITE_OTHER): Payer: Medicaid Other | Admitting: Obstetrics and Gynecology

## 2015-06-22 ENCOUNTER — Encounter (HOSPITAL_COMMUNITY): Payer: Self-pay | Admitting: *Deleted

## 2015-06-22 ENCOUNTER — Inpatient Hospital Stay (HOSPITAL_COMMUNITY)
Admission: AD | Admit: 2015-06-22 | Discharge: 2015-06-22 | Disposition: A | Discharge status: Home / Self Care | Payer: Medicaid Other | Source: Ambulatory Visit | Attending: Obstetrics & Gynecology | Admitting: Obstetrics & Gynecology

## 2015-06-22 VITALS — BP 103/58 | HR 91 | Wt 291.7 lb

## 2015-06-22 DIAGNOSIS — Z87891 Personal history of nicotine dependence: Secondary | ICD-10-CM | POA: Diagnosis not present

## 2015-06-22 DIAGNOSIS — B009 Herpesviral infection, unspecified: Secondary | ICD-10-CM

## 2015-06-22 DIAGNOSIS — O26893 Other specified pregnancy related conditions, third trimester: Secondary | ICD-10-CM | POA: Insufficient documentation

## 2015-06-22 DIAGNOSIS — Z113 Encounter for screening for infections with a predominantly sexual mode of transmission: Secondary | ICD-10-CM | POA: Insufficient documentation

## 2015-06-22 DIAGNOSIS — O98513 Other viral diseases complicating pregnancy, third trimester: Secondary | ICD-10-CM

## 2015-06-22 DIAGNOSIS — O9989 Other specified diseases and conditions complicating pregnancy, childbirth and the puerperium: Secondary | ICD-10-CM | POA: Insufficient documentation

## 2015-06-22 DIAGNOSIS — Z3A35 35 weeks gestation of pregnancy: Secondary | ICD-10-CM | POA: Insufficient documentation

## 2015-06-22 DIAGNOSIS — R51 Headache: Secondary | ICD-10-CM | POA: Diagnosis not present

## 2015-06-22 DIAGNOSIS — O98313 Other infections with a predominantly sexual mode of transmission complicating pregnancy, third trimester: Secondary | ICD-10-CM | POA: Diagnosis not present

## 2015-06-22 DIAGNOSIS — Z6841 Body Mass Index (BMI) 40.0 and over, adult: Secondary | ICD-10-CM

## 2015-06-22 DIAGNOSIS — A6009 Herpesviral infection of other urogenital tract: Secondary | ICD-10-CM

## 2015-06-22 DIAGNOSIS — G44219 Episodic tension-type headache, not intractable: Secondary | ICD-10-CM

## 2015-06-22 DIAGNOSIS — O99213 Obesity complicating pregnancy, third trimester: Secondary | ICD-10-CM

## 2015-06-22 DIAGNOSIS — M545 Low back pain, unspecified: Secondary | ICD-10-CM

## 2015-06-22 DIAGNOSIS — Z3493 Encounter for supervision of normal pregnancy, unspecified, third trimester: Secondary | ICD-10-CM

## 2015-06-22 LAB — POCT URINALYSIS DIP (DEVICE)
BILIRUBIN URINE: NEGATIVE
Glucose, UA: NEGATIVE mg/dL
Nitrite: NEGATIVE
PH: 7 (ref 5.0–8.0)
Protein, ur: 30 mg/dL — AB
SPECIFIC GRAVITY, URINE: 1.02 (ref 1.005–1.030)
UROBILINOGEN UA: 1 mg/dL (ref 0.0–1.0)

## 2015-06-22 LAB — URINALYSIS, ROUTINE W REFLEX MICROSCOPIC
Bilirubin Urine: NEGATIVE
GLUCOSE, UA: NEGATIVE mg/dL
HGB URINE DIPSTICK: NEGATIVE
Ketones, ur: 15 mg/dL — AB
Nitrite: NEGATIVE
PH: 5.5 (ref 5.0–8.0)
Protein, ur: NEGATIVE mg/dL
Specific Gravity, Urine: 1.01 (ref 1.005–1.030)

## 2015-06-22 LAB — URINE MICROSCOPIC-ADD ON: RBC / HPF: NONE SEEN RBC/hpf (ref 0–5)

## 2015-06-22 LAB — OB RESULTS CONSOLE GBS: GBS: NEGATIVE

## 2015-06-22 MED ORDER — METOCLOPRAMIDE HCL 5 MG/ML IJ SOLN
10.0000 mg | Freq: Once | INTRAMUSCULAR | Status: AC
  Administered 2015-06-22: 10 mg via INTRAVENOUS
  Filled 2015-06-22: qty 2

## 2015-06-22 MED ORDER — DEXAMETHASONE SODIUM PHOSPHATE 10 MG/ML IJ SOLN
10.0000 mg | Freq: Once | INTRAMUSCULAR | Status: AC
  Administered 2015-06-22: 10 mg via INTRAVENOUS
  Filled 2015-06-22: qty 1

## 2015-06-22 MED ORDER — CYCLOBENZAPRINE HCL 10 MG PO TABS: 10.0000 mg | ORAL_TABLET | Freq: Three times a day (TID) | ORAL | Status: DC | PRN

## 2015-06-22 MED ORDER — LACTATED RINGERS IV BOLUS (SEPSIS)
1000.0000 mL | Freq: Once | INTRAVENOUS | Status: AC
  Administered 2015-06-22: 1000 mL via INTRAVENOUS

## 2015-06-22 MED ORDER — DIPHENHYDRAMINE HCL 50 MG/ML IJ SOLN
25.0000 mg | Freq: Once | INTRAMUSCULAR | Status: AC
  Administered 2015-06-22: 25 mg via INTRAVENOUS
  Filled 2015-06-22: qty 1

## 2015-06-22 MED ORDER — VALACYCLOVIR HCL 1 G PO TABS: 1000.0000 mg | ORAL_TABLET | Freq: Every day | ORAL | Status: DC

## 2015-06-22 MED ORDER — CYCLOBENZAPRINE HCL 10 MG PO TABS
10.0000 mg | ORAL_TABLET | Freq: Once | ORAL | Status: AC
  Administered 2015-06-22: 10 mg via ORAL
  Filled 2015-06-22: qty 1

## 2015-06-22 NOTE — MAU Provider Note (Signed)
First Provider Initiated Contact with Patient 06/22/15 1931      Chief Complaint:    [name] is  25 y.o. [phone] at 4577w5d presents complaining of lower back pain, spasms, and HA that is still present afater TYlenol. Denies vision changes, RUQ pain.    Obstetrical/Gynecological History: OB History    Gravida Para Term Preterm AB TAB SAB Ectopic Multiple Living   4 2 2  0 1 0 1 0 0 2     Past Medical History: Past Medical History  Diagnosis Date  . Asthma   . Gonorrhea 2011  . Chlamydia 2011  . Vaginal Pap smear, abnormal     Past Surgical History: Past Surgical History  Procedure Laterality Date  . Knee surgery  2007    Family History: Family History  Problem Relation Age of Onset  . Anesthesia problems Neg Hx   . Diabetes Maternal Grandmother   . Hypertension Maternal Grandmother     Social History: Social History  Substance Use Topics  . Smoking status: Current Every Day Smoker -- 0.25 packs/day for 5 years    Types: Cigarettes  . Smokeless tobacco: Never Used  . Alcohol Use: No     Comment: Quit: 03/17/12    Allergies: No Known Allergies  Meds:  Prescriptions prior to admission  Medication Sig Dispense Refill Last Dose  . acetaminophen (TYLENOL) 325 MG tablet Take 650 mg by mouth every 6 (six) hours as needed for mild pain or headache.   06/22/2015 at Unknown time  . cetirizine (ZYRTEC) 10 MG tablet Take 10 mg by mouth daily.   06/21/2015 at Unknown time  . fluticasone (FLONASE) 50 MCG/ACT nasal spray Place 1 spray into both nostrils daily. 16 g 2 06/22/2015 at Unknown time  . Prenatal MV-Min-Fe Cbn-FA-DHA (PRENATAL PLUS DHA) 7-0.4-100 MG TABS Take 1 tablet by mouth daily. 60 each 5 06/22/2015 at Unknown time  . valACYclovir (VALTREX) 1000 MG tablet Take 1 tablet (1,000 mg total) by mouth daily. 60 tablet 0 06/22/2015 at Unknown time    Review of Systems   Constitutional: Negative for fever and chills Eyes: Negative for visual  disturbances Respiratory: Negative for shortness of breath, dyspnea Cardiovascular: Negative for chest pain or palpitations  Gastrointestinal: Negative for vomiting, diarrhea and constipation Genitourinary: Negative for dysuria and urgency Musculoskeletal: Normal ROM  Neurological: Negative for dizziness    Physical Exam  Blood pressure 133/79, pulse 89, temperature 98.5 F (36.9 C), temperature source Oral, resp. rate 20, height 5\' 6"  (1.676 m), weight 131.997 kg (291 lb), last menstrual period 10/15/2014, SpO2 99 %. GENERAL: Well-developed, well-nourished female in no acute distress.  LUNGS: Clear to auscultation bilaterally.  HEART: Regular rate and rhythm. ABDOMEN: Soft, nontender, nondistended, gravid.  EXTREMITIES: Nontender, no edema, 2+ distal pulses. DTR's 2+ CERVICAL EXAM: Dilatation 1/2cm   Effacement 0%   Station -3   Presentation: cephalic FHT:  Baseline rate 140 bpm   Variability moderate  Accelerations present   Decelerations none Contractions: Every 0 mins   Labs: Results for orders placed or performed during the hospital encounter of 06/22/15 (from the past 24 hour(s))  Urinalysis, Routine w reflex microscopic (not at [facility])   Collection Time: 06/22/15  7:10 PM  Result Value Ref Range   Color, Urine YELLOW YELLOW   APPearance CLEAR CLEAR   Specific Gravity, Urine 1.010 1.005 - 1.030   pH 5.5 5.0 - 8.0   Glucose, UA NEGATIVE NEGATIVE mg/dL   Hgb urine dipstick NEGATIVE NEGATIVE  Bilirubin Urine NEGATIVE NEGATIVE   Ketones, ur 15 (A) NEGATIVE mg/dL   Protein, ur NEGATIVE NEGATIVE mg/dL   Nitrite NEGATIVE NEGATIVE   Leukocytes, UA TRACE (A) NEGATIVE  Urine microscopic-add on   Collection Time: 06/22/15  7:10 PM  Result Value Ref Range   Squamous Epithelial / LPF 0-5 (A) NONE SEEN   WBC, UA 0-5 0 - 5 WBC/hpf   RBC / HPF NONE SEEN 0 - 5 RBC/hpf   Bacteria, UA RARE (A) NONE SEEN  Results for orders placed or performed in visit on 06/22/15 (from the past 24  hour(s))  POCT urinalysis dip (device)   Collection Time: 06/22/15 10:12 AM  Result Value Ref Range   Glucose, UA NEGATIVE NEGATIVE mg/dL   Bilirubin Urine NEGATIVE NEGATIVE   Ketones, ur TRACE (A) NEGATIVE mg/dL   Specific Gravity, Urine 1.020 1.005 - 1.030   Hgb urine dipstick TRACE (A) NEGATIVE   pH 7.0 5.0 - 8.0   Protein, ur 30 (A) NEGATIVE mg/dL   Urobilinogen, UA 1.0 0.0 - 1.0 mg/dL   Nitrite NEGATIVE NEGATIVE   Leukocytes, UA SMALL (A) NEGATIVE   Imaging Studies:  No results found.  Assessment: [name] is  25 y.o. [phone] at [redacted]w[redacted]d presents with HA, LBP.  Both resolved after meds (HA cocktail IV and flexeril).  Plan: DC home Rx flexeril fro PRN use  [name] 6/1/201710:38 PM

## 2015-06-22 NOTE — Progress Notes (Signed)
36 wk cultures today   

## 2015-06-22 NOTE — MAU Note (Signed)
Pt reports a lot of back pain, contractions, pressure and a headache since she left her MD appointment at 10am. Took 2 tylenol and it did not help at all.

## 2015-06-22 NOTE — Discharge Instructions (Signed)
Back Exercises °The following exercises strengthen the muscles that help to support the back. They also help to keep the lower back flexible. Doing these exercises can help to prevent back pain or lessen existing pain. °If you have back pain or discomfort, try doing these exercises 2-3 times each day or as told by your health care provider. When the pain goes away, do them once each day, but increase the number of times that you repeat the steps for each exercise (do more repetitions). If you do not have back pain or discomfort, do these exercises once each day or as told by your health care provider. °EXERCISES °Single Knee to Chest °Repeat these steps 3-5 times for each leg: °· Lie on your back on a firm bed or the floor with your legs extended. °· Bring one knee to your chest. Your other leg should stay extended and in contact with the floor. °· Hold your knee in place by grabbing your knee or thigh. °· Pull on your knee until you feel a gentle stretch in your lower back. °· Hold the stretch for 10-30 seconds. °· Slowly release and straighten your leg. °Pelvic Tilt °Repeat these steps 5-10 times: °· Lie on your back on a firm bed or the floor with your legs extended. °· Bend your knees so they are pointing toward the ceiling and your feet are flat on the floor. °· Tighten your lower abdominal muscles to press your lower back against the floor. This motion will tilt your pelvis so your tailbone points up toward the ceiling instead of pointing to your feet or the floor. °· With gentle tension and even breathing, hold this position for 5-10 seconds. °Cat-Cow °Repeat these steps until your lower back becomes more flexible: °· Get into a hands-and-knees position on a firm surface. Keep your hands under your shoulders, and keep your knees under your hips. You may place padding under your knees for comfort. °· Let your head hang down, and point your tailbone toward the floor so your lower back becomes rounded like the  back of a cat. °· Hold this position for 5 seconds. °· Slowly lift your head and point your tailbone up toward the ceiling so your back forms a sagging arch like the back of a cow. °· Hold this position for 5 seconds. °Press-Ups °Repeat these steps 5-10 times: °· Lie on your abdomen (face-down) on the floor. °· Place your palms near your head, about shoulder-width apart. °· While you keep your back as relaxed as possible and keep your hips on the floor, slowly straighten your arms to raise the top half of your body and lift your shoulders. Do not use your back muscles to raise your upper torso. You may adjust the placement of your hands to make yourself more comfortable. °· Hold this position for 5 seconds while you keep your back relaxed. °· Slowly return to lying flat on the floor. °Bridges °Repeat these steps 10 times: °· Lie on your back on a firm surface. °· Bend your knees so they are pointing toward the ceiling and your feet are flat on the floor. °· Tighten your buttocks muscles and lift your buttocks off of the floor until your waist is at almost the same height as your knees. You should feel the muscles working in your buttocks and the back of your thighs. If you do not feel these muscles, slide your feet 1-2 inches farther away from your buttocks. °· Hold this position for 3-5   seconds.  Slowly lower your hips to the starting position, and allow your buttocks muscles to relax completely. If this exercise is too easy, try doing it with your arms crossed over your chest. Abdominal Crunches Repeat these steps 5-10 times:  Lie on your back on a firm bed or the floor with your legs extended.  Bend your knees so they are pointing toward the ceiling and your feet are flat on the floor.  Cross your arms over your chest.  Tip your chin slightly toward your chest without bending your neck.  Tighten your abdominal muscles and slowly raise your trunk (torso) high enough to lift your shoulder blades a  tiny bit off of the floor. Avoid raising your torso higher than that, because it can put too much stress on your low back and it does not help to strengthen your abdominal muscles.  Slowly return to your starting position. Back Lifts Repeat these steps 5-10 times:  Lie on your abdomen (face-down) with your arms at your sides, and rest your forehead on the floor.  Tighten the muscles in your legs and your buttocks.  Slowly lift your chest off of the floor while you keep your hips pressed to the floor. Keep the back of your head in line with the curve in your back. Your eyes should be looking at the floor.  Hold this position for 3-5 seconds.  Slowly return to your starting position. SEEK MEDICAL CARE IF:  Your back pain or discomfort gets much worse when you do an exercise.  Your back pain or discomfort does not lessen within 2 hours after you exercise. If you have any of these problems, stop doing these exercises right away. Do not do them again unless your health care provider says that you can. SEEK IMMEDIATE MEDICAL CARE IF:  You develop sudden, severe back pain. If this happens, stop doing the exercises right away. Do not do them again unless your health care provider says that you can.   This information is not intended to replace advice given to you by your health care provider. Make sure you discuss any questions you have with your health care provider.   Document Released: 02/15/2004 Document Revised: 09/28/2014 Document Reviewed: 03/03/2014 Elsevier Interactive Patient Education 2016 [employer]  Back Pain in Pregnancy Back pain during pregnancy is common. It happens in about half of all pregnancies. It is important for you and your baby that you remain active during your pregnancy.If you feel that back pain is not allowing you to remain active or sleep well, it is time to see your caregiver. Back pain may be caused by several factors related to changes during your  pregnancy.Fortunately, unless you had trouble with your back before your pregnancy, the pain is likely to get better after you deliver. Low back pain usually occurs between the fifth and seventh months of pregnancy. It can, however, happen in the first couple months. Factors that increase the risk of back problems include:   Previous back problems.  Injury to your back.  Having twins or multiple births.  A chronic cough.  Stress.  Job-related repetitive motions.  Muscle or spinal disease in the back.  Family history of back problems, ruptured (herniated) discs, or osteoporosis.  Depression, anxiety, and panic attacks. CAUSES   When you are pregnant, your body produces a hormone called relaxin. This hormonemakes the ligaments connecting the low back and pubic bones more flexible. This flexibility allows the baby to be delivered more easily.  When your ligaments are loose, your muscles need to work harder to support your back. Soreness in your back can come from tired muscles. Soreness can also come from back tissues that are irritated since they are receiving less support.  As the baby grows, it puts pressure on the nerves and blood vessels in your pelvis. This can cause back pain.  As the baby grows and gets heavier during pregnancy, the uterus pushes the stomach muscles forward and changes your center of gravity. This makes your back muscles work harder to maintain good posture. SYMPTOMS  Lumbar pain during pregnancy Lumbar pain during pregnancy usually occurs at or above the waist in the center of the back. There may be pain and numbness that radiates into your leg or foot. This is similar to low back pain experienced by non-pregnant women. It usually increases with sitting for long periods of time, standing, or repetitive lifting. Tenderness may also be present in the muscles along your upper back. Posterior pelvic pain during pregnancy Pain in the back of the pelvis is more common  than lumbar pain in pregnancy. It is a deep pain felt in your side at the waistline, or across the tailbone (sacrum), or in both places. You may have pain on one or both sides. This pain can also go into the buttocks and backs of the upper thighs. Pubic and groin pain may also be present. The pain does not quickly resolve with rest, and morning stiffness may also be present. Pelvic pain during pregnancy can be brought on by most activities. A high level of fitness before and during pregnancy may or may not prevent this problem. Labor pain is usually 1 to 2 minutes apart, lasts for about 1 minute, and involves a bearing down feeling or pressure in your pelvis. However, if you are at term with the pregnancy, constant low back pain can be the beginning of early labor, and you should be aware of this. DIAGNOSIS  X-rays of the back should not be done during the first 12 to 14 weeks of the pregnancy and only when absolutely necessary during the rest of the pregnancy. MRIs do not give off radiation and are safe during pregnancy. MRIs also should only be done when absolutely necessary. HOME CARE INSTRUCTIONS  Exercise as directed by your caregiver. Exercise is the most effective way to prevent or manage back pain. If you have a back problem, it is especially important to avoid sports that require sudden body movements. Swimming and walking are great activities.  Do not stand in one place for long periods of time.  Do not wear high heels.  Sit in chairs with good posture. Use a pillow on your lower back if necessary. Make sure your head rests over your shoulders and is not hanging forward.  Try sleeping on your side, preferably the left side, with a pillow or two between your legs. If you are sore after a night's rest, your bedmay betoo soft.Try placing a board between your mattress and box spring.  Listen to your body when lifting.If you are experiencing pain, ask for help or try bending yourknees more  so you can use your leg muscles rather than your back muscles. Squat down when picking up something from the floor. Do not bend over.  Eat a healthy diet. Try to gain weight within your caregiver's recommendations.  Use heat or cold packs 3 to 4 times a day for 15 minutes to help with the pain.  Only take  over-the-counter or prescription medicines for pain, discomfort, or fever as directed by your caregiver. Sudden (acute) back pain  Use bed rest for only the most extreme, acute episodes of back pain. Prolonged bed rest over 48 hours will aggravate your condition.  Ice is very effective for acute conditions.  Put ice in a plastic bag.  Place a towel between your skin and the bag.  Leave the ice on for 10 to 20 minutes every 2 hours, or as needed.  Using heat packs for 30 minutes prior to activities is also helpful. Continued back pain See your caregiver if you have continued problems. Your caregiver can help or refer you for appropriate physical therapy. With conditioning, most back problems can be avoided. Sometimes, a more serious issue may be the cause of back pain. You should be seen right away if new problems seem to be developing. Your caregiver may recommend:  A maternity girdle.  An elastic sling.  A back brace.  A massage therapist or acupuncture. SEEK MEDICAL CARE IF:   You are not able to do most of your daily activities, even when taking the pain medicine you were given.  You need a referral to a physical therapist or chiropractor.  You want to try acupuncture. SEEK IMMEDIATE MEDICAL CARE IF:  You develop numbness, tingling, weakness, or problems with the use of your arms or legs.  You develop severe back pain that is no longer relieved with medicines.  You have a sudden change in bowel or bladder control.  You have increasing pain in other areas of the body.  You develop shortness of breath, dizziness, or fainting.  You develop nausea, vomiting, or  sweating.  You have back pain which is similar to labor pains.  You have back pain along with your water breaking or vaginal bleeding.  You have back pain or numbness that travels down your leg.  Your back pain developed after you fell.  You develop pain on one side of your back. You may have a kidney stone.  You see blood in your urine. You may have a bladder infection or kidney stone.  You have back pain with blisters. You may have shingles. Back pain is fairly common during pregnancy but should not be accepted as just part of the process. Back pain should always be treated as soon as possible. This will make your pregnancy as pleasant as possible.   This information is not intended to replace advice given to you by your health care provider. Make sure you discuss any questions you have with your health care provider.   Document Released: 04/17/2005 Document Revised: 04/01/2011 Document Reviewed: 05/29/2010 Elsevier Interactive Patient Education [employer]

## 2015-06-22 NOTE — Progress Notes (Signed)
Subjective:  [name] is a 25 y.o. [id number] at 2950w5d being seen today for ongoing prenatal care.  She is currently monitored for the following issues for this low-risk pregnancy and has Morbid obesity with BMI of 45.0-49.9, adult (HCC); Herpes simplex type 2 infection; Smoker; and Supervision of low-risk pregnancy on her problem list.  Patient reports no complaints.  Contractions: Not present. Vag. Bleeding: None.  Movement: Present. Denies leaking of fluid.   The following portions of the patient's history were reviewed and updated as appropriate: allergies, current medications, past family history, past medical history, past social history, past surgical history and problem list. Problem list updated.  Objective:   Filed Vitals:   06/22/15 1040  BP: 103/58  Pulse: 91  Weight: 291 lb 11.2 oz (132.314 kg)    Fetal Status: Fetal Heart Rate (bpm): 145   Movement: Present     General:  Alert, oriented and cooperative. Patient is in no acute distress.  Skin: Skin is warm and dry. No rash noted.   Cardiovascular: Normal heart rate noted  Respiratory: Normal respiratory effort, no problems with respiration noted  Abdomen: Soft, gravid, appropriate for gestational age. Pain/Pressure: Present     Pelvic: Vag. Bleeding: None Vag D/C Character: Mucous   Cervical exam performed        Extremities: Normal range of motion.  Edema: Trace  Mental Status: Normal mood and affect. Normal behavior. Normal judgment and thought content.   Urinalysis: Urine Protein: 1+ Urine Glucose: Negative  Assessment and Plan:  Pregnancy: [id number] at 1650w5d  1. Supervision of low-risk pregnancy, third trimester Patient is doing well Cultures collected - Culture, beta strep (group b only) - GC[facility]Chlamydia probe amp ([place]  2. Morbid obesity with BMI of 45.0-49.9, adult (HCC)   3. Herpes simplex type 2 infection Patient has not been taking her Valtrex as she is unable to obtain the  medication Refill provided  4. HSV-2 infection complicating pregnancy, third trimester   Preterm labor symptoms and general obstetric precautions including but not limited to vaginal bleeding, contractions, leaking of fluid and fetal movement were reviewed in detail with the patient. Please refer to After Visit Summary for other counseling recommendations.  Return in about 1 week (around 06/29/2015).   Catalina AntiguaPeggy Adarius Tigges, MD

## 2015-06-23 LAB — GC/CHLAMYDIA PROBE AMP (~~LOC~~) NOT AT ARMC
CHLAMYDIA, DNA PROBE: NEGATIVE
NEISSERIA GONORRHEA: NEGATIVE

## 2015-06-24 LAB — CULTURE, BETA STREP (GROUP B ONLY)

## 2015-07-04 ENCOUNTER — Ambulatory Visit (INDEPENDENT_AMBULATORY_CARE_PROVIDER_SITE_OTHER): Payer: Medicaid Other | Admitting: Family Medicine

## 2015-07-04 ENCOUNTER — Other Ambulatory Visit (HOSPITAL_COMMUNITY)
Admission: RE | Admit: 2015-07-04 | Discharge: 2015-07-04 | Disposition: A | Discharge status: Home / Self Care | Payer: Medicaid Other | Source: Ambulatory Visit | Attending: Family Medicine | Admitting: Family Medicine

## 2015-07-04 VITALS — BP 125/63 | HR 84 | Temp 97.8°F | Wt 283.3 lb

## 2015-07-04 DIAGNOSIS — Z01419 Encounter for gynecological examination (general) (routine) without abnormal findings: Secondary | ICD-10-CM | POA: Insufficient documentation

## 2015-07-04 DIAGNOSIS — Z3493 Encounter for supervision of normal pregnancy, unspecified, third trimester: Secondary | ICD-10-CM

## 2015-07-04 DIAGNOSIS — Z124 Encounter for screening for malignant neoplasm of cervix: Secondary | ICD-10-CM | POA: Diagnosis not present

## 2015-07-04 LAB — POCT URINALYSIS DIP (DEVICE)
Bilirubin Urine: NEGATIVE
Glucose, UA: NEGATIVE mg/dL
Ketones, ur: NEGATIVE mg/dL
Nitrite: NEGATIVE
Protein, ur: NEGATIVE mg/dL
Specific Gravity, Urine: 1.015 (ref 1.005–1.030)
Urobilinogen, UA: 1 mg/dL (ref 0.0–1.0)
pH: 7.5 (ref 5.0–8.0)

## 2015-07-04 NOTE — Patient Instructions (Signed)
Third Trimester of Pregnancy The third trimester is from week 29 through week 42, months 7 through 9. The third trimester is a time when the fetus is growing rapidly. At the end of the ninth month, the fetus is about 20 inches in length and weighs 6-10 pounds.  BODY CHANGES Your body goes through many changes during pregnancy. The changes vary from woman to woman.   Your weight will continue to increase. You can expect to gain 25-35 pounds (11-16 kg) by the end of the pregnancy.  You may begin to get stretch marks on your hips, abdomen, and breasts.  You may urinate more often because the fetus is moving lower into your pelvis and pressing on your bladder.  You may develop or continue to have heartburn as a result of your pregnancy.  You may develop constipation because certain hormones are causing the muscles that push waste through your intestines to slow down.  You may develop hemorrhoids or swollen, bulging veins (varicose veins).  You may have pelvic pain because of the weight gain and pregnancy hormones relaxing your joints between the bones in your pelvis. Backaches may result from overexertion of the muscles supporting your posture.  You may have changes in your hair. These can include thickening of your hair, rapid growth, and changes in texture. Some women also have hair loss during or after pregnancy, or hair that feels dry or thin. Your hair will most likely return to normal after your baby is born.  Your breasts will continue to grow and be tender. A yellow discharge may leak from your breasts called colostrum.  Your belly button may stick out.  You may feel short of breath because of your expanding uterus.  You may notice the fetus "dropping," or moving lower in your abdomen.  You may have a bloody mucus discharge. This usually occurs a few days to a week before labor begins.  Your cervix becomes thin and soft (effaced) near your due date. WHAT TO EXPECT AT YOUR  PRENATAL EXAMS  You will have prenatal exams every 2 weeks until week 36. Then, you will have weekly prenatal exams. During a routine prenatal visit:  You will be weighed to make sure you and the fetus are growing normally.  Your blood pressure is taken.  Your abdomen will be measured to track your baby's growth.  The fetal heartbeat will be listened to.  Any test results from the previous visit will be discussed.  You may have a cervical check near your due date to see if you have effaced. At around 36 weeks, your caregiver will check your cervix. At the same time, your caregiver will also perform a test on the secretions of the vaginal tissue. This test is to determine if a type of bacteria, Group B streptococcus, is present. Your caregiver will explain this further. Your caregiver may ask you:  What your birth plan is.  How you are feeling.  If you are feeling the baby move.  If you have had any abnormal symptoms, such as leaking fluid, bleeding, severe headaches, or abdominal cramping.  If you are using any tobacco products, including cigarettes, chewing tobacco, and electronic cigarettes.  If you have any questions. Other tests or screenings that may be performed during your third trimester include:  Blood tests that check for low iron levels (anemia).  Fetal testing to check the health, activity level, and growth of the fetus. Testing is done if you have certain medical conditions or if   there are problems during the pregnancy.  HIV (human immunodeficiency virus) testing. If you are at high risk, you may be screened for HIV during your third trimester of pregnancy. FALSE LABOR You may feel small, irregular contractions that eventually go away. These are called Braxton Hicks contractions, or false labor. Contractions may last for hours, days, or even weeks before true labor sets in. If contractions come at regular intervals, intensify, or become painful, it is best to be seen  by your caregiver.  SIGNS OF LABOR   Menstrual-like cramps.  Contractions that are 5 minutes apart or less.  Contractions that start on the top of the uterus and spread down to the lower abdomen and back.  A sense of increased pelvic pressure or back pain.  A watery or bloody mucus discharge that comes from the vagina. If you have any of these signs before the 37th week of pregnancy, call your caregiver right away. You need to go to the hospital to get checked immediately. HOME CARE INSTRUCTIONS   Avoid all smoking, herbs, alcohol, and unprescribed drugs. These chemicals affect the formation and growth of the baby.  Do not use any tobacco products, including cigarettes, chewing tobacco, and electronic cigarettes. If you need help quitting, ask your health care provider. You may receive counseling support and other resources to help you quit.  Follow your caregiver's instructions regarding medicine use. There are medicines that are either safe or unsafe to take during pregnancy.  Exercise only as directed by your caregiver. Experiencing uterine cramps is a good sign to stop exercising.  Continue to eat regular, healthy meals.  Wear a good support bra for breast tenderness.  Do not use hot tubs, steam rooms, or saunas.  Wear your seat belt at all times when driving.  Avoid raw meat, uncooked cheese, cat litter boxes, and soil used by cats. These carry germs that can cause birth defects in the baby.  Take your prenatal vitamins.  Take 1500-2000 mg of calcium daily starting at the 20th week of pregnancy until you deliver your baby.  Try taking a stool softener (if your caregiver approves) if you develop constipation. Eat more high-fiber foods, such as fresh vegetables or fruit and whole grains. Drink plenty of fluids to keep your urine clear or pale yellow.  Take warm sitz baths to soothe any pain or discomfort caused by hemorrhoids. Use hemorrhoid cream if your caregiver  approves.  If you develop varicose veins, wear support hose. Elevate your feet for 15 minutes, 3-4 times a day. Limit salt in your diet.  Avoid heavy lifting, wear low heal shoes, and practice good posture.  Rest a lot with your legs elevated if you have leg cramps or low back pain.  Visit your dentist if you have not gone during your pregnancy. Use a soft toothbrush to brush your teeth and be gentle when you floss.  A sexual relationship may be continued unless your caregiver directs you otherwise.  Do not travel far distances unless it is absolutely necessary and only with the approval of your caregiver.  Take prenatal classes to understand, practice, and ask questions about the labor and delivery.  Make a trial run to the hospital.  Pack your hospital bag.  Prepare the baby's nursery.  Continue to go to all your prenatal visits as directed by your caregiver. SEEK MEDICAL CARE IF:  You are unsure if you are in labor or if your water has broken.  You have dizziness.  You have   mild pelvic cramps, pelvic pressure, or nagging pain in your abdominal area.  You have persistent nausea, vomiting, or diarrhea.  You have a bad smelling vaginal discharge.  You have pain with urination. SEEK IMMEDIATE MEDICAL CARE IF:   You have a fever.  You are leaking fluid from your vagina.  You have spotting or bleeding from your vagina.  You have severe abdominal cramping or pain.  You have rapid weight loss or gain.  You have shortness of breath with chest pain.  You notice sudden or extreme swelling of your face, hands, ankles, feet, or legs.  You have not felt your baby move in over an hour.  You have severe headaches that do not go away with medicine.  You have vision changes.   This information is not intended to replace advice given to you by your health care provider. Make sure you discuss any questions you have with your health care provider.   Document Released:  01/01/2001 Document Revised: 01/28/2014 Document Reviewed: 03/10/2012 Elsevier Interactive Patient Education 2016 [employer]  Breastfeeding Deciding to breastfeed is one of the best choices you can make for you and your baby. A change in hormones during pregnancy causes your breast tissue to grow and increases the number and size of your milk ducts. These hormones also allow proteins, sugars, and fats from your blood supply to make breast milk in your milk-producing glands. Hormones prevent breast milk from being released before your baby is born as well as prompt milk flow after birth. Once breastfeeding has begun, thoughts of your baby, as well as his or her sucking or crying, can stimulate the release of milk from your milk-producing glands.  BENEFITS OF BREASTFEEDING For Your Baby  Your first milk (colostrum) helps your baby's digestive system function better.  There are antibodies in your milk that help your baby fight off infections.  Your baby has a lower incidence of asthma, allergies, and sudden infant death syndrome.  The nutrients in breast milk are better for your baby than infant formulas and are designed uniquely for your baby's needs.  Breast milk improves your baby's brain development.  Your baby is less likely to develop other conditions, such as childhood obesity, asthma, or type 2 diabetes mellitus. For You  Breastfeeding helps to create a very special bond between you and your baby.  Breastfeeding is convenient. Breast milk is always available at the correct temperature and costs nothing.  Breastfeeding helps to burn calories and helps you lose the weight gained during pregnancy.  Breastfeeding makes your uterus contract to its prepregnancy size faster and slows bleeding (lochia) after you give birth.   Breastfeeding helps to lower your risk of developing type 2 diabetes mellitus, osteoporosis, and breast or ovarian cancer later in life. SIGNS THAT YOUR BABY IS  HUNGRY Early Signs of Hunger  Increased alertness or activity.  Stretching.  Movement of the head from side to side.  Movement of the head and opening of the mouth when the corner of the mouth or cheek is stroked (rooting).  Increased sucking sounds, smacking lips, cooing, sighing, or squeaking.  Hand-to-mouth movements.  Increased sucking of fingers or hands. Late Signs of Hunger  Fussing.  Intermittent crying. Extreme Signs of Hunger Signs of extreme hunger will require calming and consoling before your baby will be able to breastfeed successfully. Do not wait for the following signs of extreme hunger to occur before you initiate breastfeeding:  Restlessness.  A loud, strong cry.  Screaming.   BREASTFEEDING BASICS Breastfeeding Initiation  Find a comfortable place to sit or lie down, with your neck and back well supported.  Place a pillow or rolled up blanket under your baby to bring him or her to the level of your breast (if you are seated). Nursing pillows are specially designed to help support your arms and your baby while you breastfeed.  Make sure that your baby's abdomen is facing your abdomen.  Gently massage your breast. With your fingertips, massage from your chest wall toward your nipple in a circular motion. This encourages milk flow. You may need to continue this action during the feeding if your milk flows slowly.  Support your breast with 4 fingers underneath and your thumb above your nipple. Make sure your fingers are well away from your nipple and your baby's mouth.  Stroke your baby's lips gently with your finger or nipple.  When your baby's mouth is open wide enough, quickly bring your baby to your breast, placing your entire nipple and as much of the colored area around your nipple (areola) as possible into your baby's mouth.  More areola should be visible above your baby's upper lip than below the lower lip.  Your baby's tongue should be between his  or her lower gum and your breast.  Ensure that your baby's mouth is correctly positioned around your nipple (latched). Your baby's lips should create a seal on your breast and be turned out (everted).  It is common for your baby to suck about 2-3 minutes in order to start the flow of breast milk. Latching Teaching your baby how to latch on to your breast properly is very important. An improper latch can cause nipple pain and decreased milk supply for you and poor weight gain in your baby. Also, if your baby is not latched onto your nipple properly, he or she may swallow some air during feeding. This can make your baby fussy. Burping your baby when you switch breasts during the feeding can help to get rid of the air. However, teaching your baby to latch on properly is still the best way to prevent fussiness from swallowing air while breastfeeding. Signs that your baby has successfully latched on to your nipple:  Silent tugging or silent sucking, without causing you pain.  Swallowing heard between every 3-4 sucks.  Muscle movement above and in front of his or her ears while sucking. Signs that your baby has not successfully latched on to nipple:  Sucking sounds or smacking sounds from your baby while breastfeeding.  Nipple pain. If you think your baby has not latched on correctly, slip your finger into the corner of your baby's mouth to break the suction and place it between your baby's gums. Attempt breastfeeding initiation again. Signs of Successful Breastfeeding Signs from your baby:  A gradual decrease in the number of sucks or complete cessation of sucking.  Falling asleep.  Relaxation of his or her body.  Retention of a small amount of milk in his or her mouth.  Letting go of your breast by himself or herself. Signs from you:  Breasts that have increased in firmness, weight, and size 1-3 hours after feeding.  Breasts that are softer immediately after  breastfeeding.  Increased milk volume, as well as a change in milk consistency and color by the fifth day of breastfeeding.  Nipples that are not sore, cracked, or bleeding. Signs That Your Baby is Getting Enough Milk  Wetting at least 3 diapers in a 24-hour period.   The urine should be clear and pale yellow by age 5 days.  At least 3 stools in a 24-hour period by age 5 days. The stool should be soft and yellow.  At least 3 stools in a 24-hour period by age 7 days. The stool should be seedy and yellow.  No loss of weight greater than 10% of birth weight during the first 3 days of age.  Average weight gain of 4-7 ounces (113-198 g) per week after age 4 days.  Consistent daily weight gain by age 5 days, without weight loss after the age of 2 weeks. After a feeding, your baby may spit up a small amount. This is common. BREASTFEEDING FREQUENCY AND DURATION Frequent feeding will help you make more milk and can prevent sore nipples and breast engorgement. Breastfeed when you feel the need to reduce the fullness of your breasts or when your baby shows signs of hunger. This is called "breastfeeding on demand." Avoid introducing a pacifier to your baby while you are working to establish breastfeeding (the first 4-6 weeks after your baby is born). After this time you may choose to use a pacifier. Research has shown that pacifier use during the first year of a baby's life decreases the risk of sudden infant death syndrome (SIDS). Allow your baby to feed on each breast as long as he or she wants. Breastfeed until your baby is finished feeding. When your baby unlatches or falls asleep while feeding from the first breast, offer the second breast. Because newborns are often sleepy in the first few weeks of life, you may need to awaken your baby to get him or her to feed. Breastfeeding times will vary from baby to baby. However, the following rules can serve as a guide to help you ensure that your baby is  properly fed:  Newborns (babies 4 weeks of age or younger) may breastfeed every 1-3 hours.  Newborns should not go longer than 3 hours during the day or 5 hours during the night without breastfeeding.  You should breastfeed your baby a minimum of 8 times in a 24-hour period until you begin to introduce solid foods to your baby at around 6 months of age. BREAST MILK PUMPING Pumping and storing breast milk allows you to ensure that your baby is exclusively fed your breast milk, even at times when you are unable to breastfeed. This is especially important if you are going back to work while you are still breastfeeding or when you are not able to be present during feedings. Your lactation consultant can give you guidelines on how long it is safe to store breast milk. A breast pump is a machine that allows you to pump milk from your breast into a sterile bottle. The pumped breast milk can then be stored in a refrigerator or freezer. Some breast pumps are operated by hand, while others use electricity. Ask your lactation consultant which type will work best for you. Breast pumps can be purchased, but some hospitals and breastfeeding support groups lease breast pumps on a monthly basis. A lactation consultant can teach you how to hand express breast milk, if you prefer not to use a pump. CARING FOR YOUR BREASTS WHILE YOU BREASTFEED Nipples can become dry, cracked, and sore while breastfeeding. The following recommendations can help keep your breasts moisturized and healthy:  Avoid using soap on your nipples.  Wear a supportive bra. Although not required, special nursing bras and tank tops are designed to allow access to your   breasts for breastfeeding without taking off your entire bra or top. Avoid wearing underwire-style bras or extremely tight bras.  Air dry your nipples for 3-4minutes after each feeding.  Use only cotton bra pads to absorb leaked breast milk. Leaking of breast milk between feedings  is normal.  Use lanolin on your nipples after breastfeeding. Lanolin helps to maintain your skin's normal moisture barrier. If you use pure lanolin, you do not need to wash it off before feeding your baby again. Pure lanolin is not toxic to your baby. You may also hand express a few drops of breast milk and gently massage that milk into your nipples and allow the milk to air dry. In the first few weeks after giving birth, some women experience extremely full breasts (engorgement). Engorgement can make your breasts feel heavy, warm, and tender to the touch. Engorgement peaks within 3-5 days after you give birth. The following recommendations can help ease engorgement:  Completely empty your breasts while breastfeeding or pumping. You may want to start by applying warm, moist heat (in the shower or with warm water-soaked hand towels) just before feeding or pumping. This increases circulation and helps the milk flow. If your baby does not completely empty your breasts while breastfeeding, pump any extra milk after he or she is finished.  Wear a snug bra (nursing or regular) or tank top for 1-2 days to signal your body to slightly decrease milk production.  Apply ice packs to your breasts, unless this is too uncomfortable for you.  Make sure that your baby is latched on and positioned properly while breastfeeding. If engorgement persists after 48 hours of following these recommendations, contact your health care provider or a lactation consultant. OVERALL HEALTH CARE RECOMMENDATIONS WHILE BREASTFEEDING  Eat healthy foods. Alternate between meals and snacks, eating 3 of each per day. Because what you eat affects your breast milk, some of the foods may make your baby more irritable than usual. Avoid eating these foods if you are sure that they are negatively affecting your baby.  Drink milk, fruit juice, and water to satisfy your thirst (about 10 glasses a day).  Rest often, relax, and continue to take  your prenatal vitamins to prevent fatigue, stress, and anemia.  Continue breast self-awareness checks.  Avoid chewing and smoking tobacco. Chemicals from cigarettes that pass into breast milk and exposure to secondhand smoke may harm your baby.  Avoid alcohol and drug use, including marijuana. Some medicines that may be harmful to your baby can pass through breast milk. It is important to ask your health care provider before taking any medicine, including all over-the-counter and prescription medicine as well as vitamin and herbal supplements. It is possible to become pregnant while breastfeeding. If birth control is desired, ask your health care provider about options that will be safe for your baby. SEEK MEDICAL CARE IF:  You feel like you want to stop breastfeeding or have become frustrated with breastfeeding.  You have painful breasts or nipples.  Your nipples are cracked or bleeding.  Your breasts are red, tender, or warm.  You have a swollen area on either breast.  You have a fever or chills.  You have nausea or vomiting.  You have drainage other than breast milk from your nipples.  Your breasts do not become full before feedings by the fifth day after you give birth.  You feel sad and depressed.  Your baby is too sleepy to eat well.  Your baby is having trouble sleeping.     Your baby is wetting less than 3 diapers in a 24-hour period.  Your baby has less than 3 stools in a 24-hour period.  Your baby's skin or the white part of his or her eyes becomes yellow.   Your baby is not gaining weight by 5 days of age. SEEK IMMEDIATE MEDICAL CARE IF:  Your baby is overly tired (lethargic) and does not want to wake up and feed.  Your baby develops an unexplained fever.   This information is not intended to replace advice given to you by your health care provider. Make sure you discuss any questions you have with your health care provider.   Document Released: 01/07/2005  Document Revised: 09/28/2014 Document Reviewed: 07/01/2012 Elsevier Interactive Patient Education 2016 [employer]  

## 2015-07-04 NOTE — Progress Notes (Signed)
Subjective:  [name] is a 25 y.o. [id number] at 6513w3d being seen today for ongoing prenatal care.  She is currently monitored for the following issues for this low-risk pregnancy and has Morbid obesity with BMI of 45.0-49.9, adult (HCC); Herpes simplex type 2 infection; Smoker; and Supervision of low-risk pregnancy on her problem list.  Patient reports no complaints.  Contractions: Irregular. Vag. Bleeding: None.  Movement: Present. Denies leaking of fluid.   The following portions of the patient's history were reviewed and updated as appropriate: allergies, current medications, past family history, past medical history, past social history, past surgical history and problem list. Problem list updated.  Objective:   Filed Vitals:   07/04/15 1018  BP: 125/63  Pulse: 84  Temp: 97.8 F (36.6 C)  Weight: 283 lb 4.8 oz (128.504 kg)    Fetal Status: Fetal Heart Rate (bpm): 135 Fundal Height: 38 cm Movement: Present  Presentation: Vertex  General:  Alert, oriented and cooperative. Patient is in no acute distress.  Skin: Skin is warm and dry. No rash noted.   Cardiovascular: Normal heart rate noted  Respiratory: Normal respiratory effort, no problems with respiration noted  Abdomen: Soft, gravid, appropriate for gestational age. Pain/Pressure: Present     Pelvic: Cervical exam deferred Dilation: 1 Effacement (%): Thick Station: -3  Extremities: Normal range of motion.  Edema: None  Mental Status: Normal mood and affect. Normal behavior. Normal judgment and thought content.   Urinalysis: Urine Protein: Negative Urine Glucose: Negative  Assessment and Plan:  Pregnancy: [id number] at 6313w3d  1. Supervision of low-risk pregnancy, third trimester Continue routine prenatal care.  - Cytology - PAP - update  Term labor symptoms and general obstetric precautions including but not limited to vaginal bleeding, contractions, leaking of fluid and fetal movement were reviewed in detail with the  patient. Please refer to After Visit Summary for other counseling recommendations.  Return in 1 week (on 07/11/2015).   Reva Boresanya S Keiyon Plack, MD

## 2015-07-05 LAB — CYTOLOGY - PAP

## 2015-07-09 ENCOUNTER — Inpatient Hospital Stay (HOSPITAL_COMMUNITY)
Admission: AD | Admit: 2015-07-09 | Discharge: 2015-07-09 | Disposition: A | Discharge status: Home / Self Care | Payer: Medicaid Other | Source: Ambulatory Visit | Attending: Obstetrics and Gynecology | Admitting: Obstetrics and Gynecology

## 2015-07-09 ENCOUNTER — Encounter (HOSPITAL_COMMUNITY): Payer: Self-pay | Admitting: *Deleted

## 2015-07-09 DIAGNOSIS — Z3493 Encounter for supervision of normal pregnancy, unspecified, third trimester: Secondary | ICD-10-CM | POA: Diagnosis not present

## 2015-07-09 NOTE — MAU Note (Signed)
PT SAYS HURT BAD  SINCE  1030PM.   [facility]-  CLINIC.   VE LAST WEEK  - 1-2  CM.       DENIES  MRSA        SAYS  FOUND HSV IN HER BLOOD-   BUT  NO OUTBREAK  EVER-   IS  TAKING  [employer]   GBS-  NEG

## 2015-07-12 ENCOUNTER — Ambulatory Visit (INDEPENDENT_AMBULATORY_CARE_PROVIDER_SITE_OTHER): Payer: Medicaid Other | Admitting: Obstetrics & Gynecology

## 2015-07-12 VITALS — BP 127/76 | HR 90 | Wt 288.0 lb

## 2015-07-12 DIAGNOSIS — Z3493 Encounter for supervision of normal pregnancy, unspecified, third trimester: Secondary | ICD-10-CM

## 2015-07-12 LAB — POCT URINALYSIS DIP (DEVICE)
GLUCOSE, UA: NEGATIVE mg/dL
HGB URINE DIPSTICK: NEGATIVE
Ketones, ur: 40 mg/dL — AB
LEUKOCYTES UA: NEGATIVE
NITRITE: NEGATIVE
Protein, ur: NEGATIVE mg/dL
Urobilinogen, UA: 1 mg/dL (ref 0.0–1.0)
pH: 6 (ref 5.0–8.0)

## 2015-07-12 NOTE — Progress Notes (Signed)
Subjective:  [name] is a 25 y.o. [id number] at 4130w4d being seen today for ongoing prenatal care.  She is currently monitored for the following issues for this low-risk pregnancy and has Morbid obesity with BMI of 45.0-49.9, adult (HCC); Herpes simplex type 2 infection; Smoker; and Supervision of low-risk pregnancy on her problem list.  Patient reports occasional contractions.  Contractions: Irregular. Vag. Bleeding: None.  Movement: Present. Denies leaking of fluid.   The following portions of the patient's history were reviewed and updated as appropriate: allergies, current medications, past family history, past medical history, past social history, past surgical history and problem list. Problem list updated.  Objective:   Filed Vitals:   07/12/15 1303  BP: 127/76  Pulse: 90  Weight: 288 lb (130.636 kg)    Fetal Status: Fetal Heart Rate (bpm): 144   Movement: Present     General:  Alert, oriented and cooperative. Patient is in no acute distress.  Skin: Skin is warm and dry. No rash noted.   Cardiovascular: Normal heart rate noted  Respiratory: Normal respiratory effort, no problems with respiration noted  Abdomen: Soft, gravid, appropriate for gestational age. Pain/Pressure: Present     Pelvic: Cervical exam performed        Extremities: Normal range of motion.  Edema: Mild pitting, slight indentation  Mental Status: Normal mood and affect. Normal behavior. Normal judgment and thought content.   Urinalysis: Urine Protein: Negative Urine Glucose: Negative  Assessment and Plan:  Pregnancy: [id number] at 2230w4d  1. Supervision of low-risk pregnancy, third trimester Normal vitals doing well   Term labor symptoms and general obstetric precautions including but not limited to vaginal bleeding, contractions, leaking of fluid and fetal movement were reviewed in detail with the patient. Please refer to After Visit Summary for other counseling recommendations.  Return in about 1  week (around 07/19/2015).   Adam PhenixJames G Haydon Dorris, MD

## 2015-07-12 NOTE — Progress Notes (Signed)
C/o contractions 8 min apart last night but have since slowed and are irregular today

## 2015-07-12 NOTE — Patient Instructions (Signed)
Vaginal Delivery °During delivery, your health care provider will help you give birth to your baby. During a vaginal delivery, you will work to push the baby out of your vagina. However, before you can push your baby out, a few things need to happen. The opening of your uterus (cervix) has to soften, thin out, and open up (dilate) all the way to 10 cm. Also, your baby has to move down from the uterus into your vagina.  °SIGNS OF LABOR  °Your health care provider will first need to make sure you are in labor. Signs of labor include:  °· Passing what is called the mucous plug before labor begins. This is a small amount of blood-stained mucus. °· Having regular, painful uterine contractions.   °· The time between contractions gets shorter.   °· The discomfort and pain gradually get more intense. °· Contraction pains get worse when walking and do not go away when resting.   °· Your cervix becomes thinner (effacement) and dilates. °BEFORE THE DELIVERY °Once you are in labor and admitted into the hospital or care center, your health care provider may do the following:  °· Perform a complete physical exam. °· Review any complications related to pregnancy or labor.  °· Check your blood pressure, pulse, temperature, and heart rate (vital signs).   °· Determine if, and when, the rupture of amniotic membranes occurred. °· Do a vaginal exam (using a sterile glove and lubricant) to determine:   °¨ The position (presentation) of the baby. Is the baby's head presenting first (vertex) in the birth canal (vagina), or are the feet or buttocks first (breech)?   °¨ The level (station) of the baby's head within the birth canal.   °¨ The effacement and dilatation of the cervix.   °· An electronic fetal monitor is usually placed on your abdomen when you first arrive. This is used to monitor your contractions and the baby's heart rate. °¨ When the monitor is on your abdomen (external fetal monitor), it can only pick up the frequency and  length of your contractions. It cannot tell the strength of your contractions. °¨ If it becomes necessary for your health care provider to know exactly how strong your contractions are or to see exactly what the baby's heart rate is doing, an internal monitor may be inserted into your vagina and uterus. Your health care provider will discuss the benefits and risks of using an internal monitor and obtain your permission before inserting the device. °¨ Continuous fetal monitoring may be needed if you have an epidural, are receiving certain medicines (such as oxytocin), or have pregnancy or labor complications. °· An IV access tube may be placed into a vein in your arm to deliver fluids and medicines if necessary. °THREE STAGES OF LABOR AND DELIVERY °Normal labor and delivery is divided into three stages. °First Stage °This stage starts when you begin to contract regularly and your cervix begins to efface and dilate. It ends when your cervix is completely open (fully dilated). The first stage is the longest stage of labor and can last from 3 hours to 15 hours.  °Several methods are available to help with labor pain. You and your health care provider will decide which option is best for you. Options include:  °· Opioid medicines. These are strong pain medicines that you can get through your IV tube or as a shot into your muscle. These medicines lessen pain but do not make it go away completely.  °· Epidural. A medicine is given through a thin tube that   is inserted in your back. The medicine numbs the lower part of your body and prevents any pain in that area. °· Paracervical pain medicine. This is an injection of an anesthetic on each side of your cervix.   °· You may request natural childbirth, which does not involve the use of pain medicines or an epidural during labor and delivery. Instead, you will use other things, such as breathing exercises, to help cope with the pain. °Second Stage °The second stage of labor  begins when your cervix is fully dilated at 10 cm. It continues until you push your baby down through the birth canal and the baby is born. This stage can take only minutes or several hours. °· The location of your baby's head as it moves through the birth canal is reported as a number called a station. If the baby's head has not started its descent, the station is described as being at minus 3 (-3). When your baby's head is at the zero station, it is at the middle of the birth canal and is engaged in the pelvis. The station of your baby helps indicate the progress of the second stage of labor. °· When your baby is born, your health care provider may hold the baby with his or her head lowered to prevent amniotic fluid, mucus, and blood from getting into the baby's lungs. The baby's mouth and nose may be suctioned with a small bulb syringe to remove any additional fluid. °· Your health care provider may then place the baby on your stomach. It is important to keep the baby from getting cold. To do this, the health care provider will dry the baby off, place the baby directly on your skin (with no blankets between you and the baby), and cover the baby with warm, dry blankets.   °· The umbilical cord is cut. °Third Stage °During the third stage of labor, your health care provider will deliver the placenta (afterbirth) and make sure your bleeding is under control. The delivery of the placenta usually takes about 5 minutes but can take up to 30 minutes. After the placenta is delivered, a medicine may be given either by IV or injection to help contract the uterus and control bleeding. If you are planning to breastfeed, you can try to do so now. °After you deliver the placenta, your uterus should contract and get very firm. If your uterus does not remain firm, your health care provider will massage it. This is important because the contraction of the uterus helps cut off bleeding at the site where the placenta was attached  to your uterus. If your uterus does not contract properly and stay firm, you may continue to bleed heavily. If there is a lot of bleeding, medicines may be given to contract the uterus and stop the bleeding.  °  °This information is not intended to replace advice given to you by your health care provider. Make sure you discuss any questions you have with your health care provider. °  °Document Released: 10/17/2007 Document Revised: 01/28/2014 Document Reviewed: 09/04/2011 °Elsevier Interactive Patient Education ©2016 [employer] ° °

## 2015-07-15 ENCOUNTER — Encounter (HOSPITAL_COMMUNITY): Payer: Self-pay | Admitting: *Deleted

## 2015-07-15 ENCOUNTER — Inpatient Hospital Stay (HOSPITAL_COMMUNITY)
Admission: AD | Admit: 2015-07-15 | Discharge: 2015-07-15 | Disposition: A | Discharge status: Home / Self Care | Payer: Medicaid Other | Source: Ambulatory Visit | Attending: Obstetrics and Gynecology | Admitting: Obstetrics and Gynecology

## 2015-07-15 DIAGNOSIS — Z3493 Encounter for supervision of normal pregnancy, unspecified, third trimester: Secondary | ICD-10-CM | POA: Diagnosis not present

## 2015-07-15 LAB — URINALYSIS, ROUTINE W REFLEX MICROSCOPIC
Bilirubin Urine: NEGATIVE
GLUCOSE, UA: NEGATIVE mg/dL
Hgb urine dipstick: NEGATIVE
Ketones, ur: NEGATIVE mg/dL
LEUKOCYTES UA: NEGATIVE
NITRITE: NEGATIVE
PH: 6 (ref 5.0–8.0)
Protein, ur: 30 mg/dL — AB

## 2015-07-15 LAB — URINE MICROSCOPIC-ADD ON

## 2015-07-15 NOTE — Progress Notes (Signed)
Printed AVS form given and patient verbalized understanding of fetal kick counts and labor precautions.  DC'd to home in good condition.

## 2015-07-15 NOTE — MAU Note (Signed)
Pt presents to MAU with complaints of contractions for a couple of hours with scan amount of vaginal bleeding. Reports baby is active

## 2015-07-15 NOTE — Discharge Instructions (Signed)
Fetal Movement Counts Patient Name: __________________________________________________ Patient Due Date: ____________________ Performing a fetal movement count is highly recommended in high-risk pregnancies, but it is good for every pregnant woman to do. Your health care provider may ask you to start counting fetal movements at 28 weeks of the pregnancy. Fetal movements often increase:  After eating a full meal.  After physical activity.  After eating or drinking something sweet or cold.  At rest. Pay attention to when you feel the baby is most active. This will help you notice a pattern of your baby's sleep and wake cycles and what factors contribute to an increase in fetal movement. It is important to perform a fetal movement count at the same time each day when your baby is normally most active.  HOW TO COUNT FETAL MOVEMENTS 1. Find a quiet and comfortable area to sit or lie down on your left side. Lying on your left side provides the best blood and oxygen circulation to your baby. 2. Write down the day and time on a sheet of paper or in a journal. 3. Start counting kicks, flutters, swishes, rolls, or jabs in a 2-hour period. You should feel at least 10 movements within 2 hours. 4. If you do not feel 10 movements in 2 hours, wait 2-3 hours and count again. Look for a change in the pattern or not enough counts in 2 hours. SEEK MEDICAL CARE IF:  You feel less than 10 counts in 2 hours, tried twice.  There is no movement in over an hour.  The pattern is changing or taking longer each day to reach 10 counts in 2 hours.  You feel the baby is not moving as he or she usually does. Date: ____________ Movements: ____________ Start time: ____________ Finish time: ____________  Date: ____________ Movements: ____________ Start time: ____________ Finish time: ____________ Date: ____________ Movements: ____________ Start time: ____________ Finish time: ____________ Date: ____________ Movements:  ____________ Start time: ____________ Finish time: ____________ Date: ____________ Movements: ____________ Start time: ____________ Finish time: ____________ Date: ____________ Movements: ____________ Start time: ____________ Finish time: ____________ Date: ____________ Movements: ____________ Start time: ____________ Finish time: ____________ Date: ____________ Movements: ____________ Start time: ____________ Finish time: ____________  Date: ____________ Movements: ____________ Start time: ____________ Finish time: ____________ Date: ____________ Movements: ____________ Start time: ____________ Finish time: ____________ Date: ____________ Movements: ____________ Start time: ____________ Finish time: ____________ Date: ____________ Movements: ____________ Start time: ____________ Finish time: ____________ Date: ____________ Movements: ____________ Start time: ____________ Finish time: ____________ Date: ____________ Movements: ____________ Start time: ____________ Finish time: ____________ Date: ____________ Movements: ____________ Start time: ____________ Finish time: ____________  Date: ____________ Movements: ____________ Start time: ____________ Finish time: ____________ Date: ____________ Movements: ____________ Start time: ____________ Finish time: ____________ Date: ____________ Movements: ____________ Start time: ____________ Finish time: ____________ Date: ____________ Movements: ____________ Start time: ____________ Finish time: ____________ Date: ____________ Movements: ____________ Start time: ____________ Finish time: ____________ Date: ____________ Movements: ____________ Start time: ____________ Finish time: ____________ Date: ____________ Movements: ____________ Start time: ____________ Finish time: ____________  Date: ____________ Movements: ____________ Start time: ____________ Finish time: ____________ Date: ____________ Movements: ____________ Start time: ____________ Finish  time: ____________ Date: ____________ Movements: ____________ Start time: ____________ Finish time: ____________ Date: ____________ Movements: ____________ Start time: ____________ Finish time: ____________ Date: ____________ Movements: ____________ Start time: ____________ Finish time: ____________ Date: ____________ Movements: ____________ Start time: ____________ Finish time: ____________ Date: ____________ Movements: ____________ Start time: ____________ Finish time: ____________  Date: ____________ Movements: ____________ Start time: ____________ Finish   time: ____________ Date: ____________ Movements: ____________ Start time: ____________ Finish time: ____________ Date: ____________ Movements: ____________ Start time: ____________ Finish time: ____________ Date: ____________ Movements: ____________ Start time: ____________ Finish time: ____________ Date: ____________ Movements: ____________ Start time: ____________ Finish time: ____________ Date: ____________ Movements: ____________ Start time: ____________ Finish time: ____________ Date: ____________ Movements: ____________ Start time: ____________ Finish time: ____________  Date: ____________ Movements: ____________ Start time: ____________ Finish time: ____________ Date: ____________ Movements: ____________ Start time: ____________ Finish time: ____________ Date: ____________ Movements: ____________ Start time: ____________ Finish time: ____________ Date: ____________ Movements: ____________ Start time: ____________ Finish time: ____________ Date: ____________ Movements: ____________ Start time: ____________ Finish time: ____________ Date: ____________ Movements: ____________ Start time: ____________ Finish time: ____________ Date: ____________ Movements: ____________ Start time: ____________ Finish time: ____________  Date: ____________ Movements: ____________ Start time: ____________ Finish time: ____________ Date: ____________  Movements: ____________ Start time: ____________ Finish time: ____________ Date: ____________ Movements: ____________ Start time: ____________ Finish time: ____________ Date: ____________ Movements: ____________ Start time: ____________ Finish time: ____________ Date: ____________ Movements: ____________ Start time: ____________ Finish time: ____________ Date: ____________ Movements: ____________ Start time: ____________ Finish time: ____________ Date: ____________ Movements: ____________ Start time: ____________ Finish time: ____________  Date: ____________ Movements: ____________ Start time: ____________ Finish time: ____________ Date: ____________ Movements: ____________ Start time: ____________ Finish time: ____________ Date: ____________ Movements: ____________ Start time: ____________ Finish time: ____________ Date: ____________ Movements: ____________ Start time: ____________ Finish time: ____________ Date: ____________ Movements: ____________ Start time: ____________ Finish time: ____________ Date: ____________ Movements: ____________ Start time: ____________ Finish time: ____________   This information is not intended to replace advice given to you by your health care provider. Make sure you discuss any questions you have with your health care provider.   Document Released: 02/06/2006 Document Revised: 01/28/2014 Document Reviewed: 11/04/2011 Elsevier Interactive Patient Education 2016 [employer] Braxton Hicks Contractions Contractions of the uterus can occur throughout pregnancy. Contractions are not always a sign that you are in labor.  WHAT ARE BRAXTON HICKS CONTRACTIONS?  Contractions that occur before labor are called Braxton Hicks contractions, or false labor. Toward the end of pregnancy (32-34 weeks), these contractions can develop more often and may become more forceful. This is not true labor because these contractions do not result in opening (dilatation) and thinning of  the cervix. They are sometimes difficult to tell apart from true labor because these contractions can be forceful and people have different pain tolerances. You should not feel embarrassed if you go to the hospital with false labor. Sometimes, the only way to tell if you are in true labor is for your health care provider to look for changes in the cervix. If there are no prenatal problems or other health problems associated with the pregnancy, it is completely safe to be sent home with false labor and await the onset of true labor. HOW CAN YOU TELL THE DIFFERENCE BETWEEN TRUE AND FALSE LABOR? False Labor  The contractions of false labor are usually shorter and not as hard as those of true labor.   The contractions are usually irregular.   The contractions are often felt in the front of the lower abdomen and in the groin.   The contractions may go away when you walk around or change positions while lying down.   The contractions get weaker and are shorter lasting as time goes on.   The contractions do not usually become progressively stronger, regular, and closer together as with true labor.  True Labor 5. Contractions in true   labor last 30-70 seconds, become very regular, usually become more intense, and increase in frequency.  6. The contractions do not go away with walking.  7. The discomfort is usually felt in the top of the uterus and spreads to the lower abdomen and low back.  8. True labor can be determined by your health care provider with an exam. This will show that the cervix is dilating and getting thinner.  WHAT TO REMEMBER  Keep up with your usual exercises and follow other instructions given by your health care provider.   Take medicines as directed by your health care provider.   Keep your regular prenatal appointments.   Eat and drink lightly if you think you are going into labor.   If Braxton Hicks contractions are making you uncomfortable:   Change  your position from lying down or resting to walking, or from walking to resting.   Sit and rest in a tub of warm water.   Drink 2-3 glasses of water. Dehydration may cause these contractions.   Do slow and deep breathing several times an hour.  WHEN SHOULD I SEEK IMMEDIATE MEDICAL CARE? Seek immediate medical care if:  Your contractions become stronger, more regular, and closer together.   You have fluid leaking or gushing from your vagina.   You have a fever.   You pass blood-tinged mucus.   You have vaginal bleeding.   You have continuous abdominal pain.   You have low back pain that you never had before.   You feel your baby's head pushing down and causing pelvic pressure.   Your baby is not moving as much as it used to.    This information is not intended to replace advice given to you by your health care provider. Make sure you discuss any questions you have with your health care provider.   Document Released: 01/07/2005 Document Revised: 01/12/2013 Document Reviewed: 10/19/2012 Elsevier Interactive Patient Education 2016 [employer]  

## 2015-07-17 ENCOUNTER — Encounter (HOSPITAL_COMMUNITY): Payer: Self-pay | Admitting: *Deleted

## 2015-07-17 ENCOUNTER — Inpatient Hospital Stay (HOSPITAL_COMMUNITY)
Admission: AD | Admit: 2015-07-17 | Discharge: 2015-07-17 | Disposition: A | Discharge status: Home / Self Care | Payer: Medicaid Other | Source: Ambulatory Visit | Attending: Obstetrics and Gynecology | Admitting: Obstetrics and Gynecology

## 2015-07-17 DIAGNOSIS — Z3493 Encounter for supervision of normal pregnancy, unspecified, third trimester: Secondary | ICD-10-CM | POA: Insufficient documentation

## 2015-07-17 HISTORY — DX: Herpesviral infection, unspecified: B00.9

## 2015-07-17 NOTE — MAU Note (Signed)
PT  SAYS SHE SW BLOODY SHOW  AT 1100.    VE IN MAU  2   CM   .     HAS HAD  SOME  UC.          GBS- NEG

## 2015-07-17 NOTE — Discharge Instructions (Signed)
Braxton Hicks Contractions °Contractions of the uterus can occur throughout pregnancy. Contractions are not always a sign that you are in labor.  °WHAT ARE BRAXTON HICKS CONTRACTIONS?  °Contractions that occur before labor are called Braxton Hicks contractions, or false labor. Toward the end of pregnancy (32-34 weeks), these contractions can develop more often and may become more forceful. This is not true labor because these contractions do not result in opening (dilatation) and thinning of the cervix. They are sometimes difficult to tell apart from true labor because these contractions can be forceful and people have different pain tolerances. You should not feel embarrassed if you go to the hospital with false labor. Sometimes, the only way to tell if you are in true labor is for your health care provider to look for changes in the cervix. °If there are no prenatal problems or other health problems associated with the pregnancy, it is completely safe to be sent home with false labor and await the onset of true labor. °HOW CAN YOU TELL THE DIFFERENCE BETWEEN TRUE AND FALSE LABOR? °False Labor °· The contractions of false labor are usually shorter and not as hard as those of true labor.   °· The contractions are usually irregular.   °· The contractions are often felt in the front of the lower abdomen and in the groin.   °· The contractions may go away when you walk around or change positions while lying down.   °· The contractions get weaker and are shorter lasting as time goes on.   °· The contractions do not usually become progressively stronger, regular, and closer together as with true labor.   °True Labor °· Contractions in true labor last 30-70 seconds, become very regular, usually become more intense, and increase in frequency.   °· The contractions do not go away with walking.   °· The discomfort is usually felt in the top of the uterus and spreads to the lower abdomen and low back.   °· True labor can be  determined by your health care provider with an exam. This will show that the cervix is dilating and getting thinner.   °WHAT TO REMEMBER °· Keep up with your usual exercises and follow other instructions given by your health care provider.   °· Take medicines as directed by your health care provider.   °· Keep your regular prenatal appointments.   °· Eat and drink lightly if you think you are going into labor.   °· If Braxton Hicks contractions are making you uncomfortable:   °¨ Change your position from lying down or resting to walking, or from walking to resting.   °¨ Sit and rest in a tub of warm water.   °¨ Drink 2-3 glasses of water. Dehydration may cause these contractions.   °¨ Do slow and deep breathing several times an hour.   °WHEN SHOULD I SEEK IMMEDIATE MEDICAL CARE? °Seek immediate medical care if: °· Your contractions become stronger, more regular, and closer together.   °· You have fluid leaking or gushing from your vagina.   °· You have a fever.   °· You pass blood-tinged mucus.   °· You have vaginal bleeding.   °· You have continuous abdominal pain.   °· You have low back pain that you never had before.   °· You feel your baby's head pushing down and causing pelvic pressure.   °· Your baby is not moving as much as it used to.   °  °This information is not intended to replace advice given to you by your health care provider. Make sure you discuss any questions you have with your health care   provider. °  °Document Released: 01/07/2005 Document Revised: 01/12/2013 Document Reviewed: 10/19/2012 °Elsevier Interactive Patient Education ©2016 [employer] ° °

## 2015-07-20 ENCOUNTER — Inpatient Hospital Stay (HOSPITAL_COMMUNITY)
Admission: AD | Admit: 2015-07-20 | Discharge: 2015-07-22 | DRG: 774 | Disposition: A | Discharge status: Home / Self Care | Payer: Medicaid Other | Source: Ambulatory Visit | Attending: Family Medicine | Admitting: Family Medicine

## 2015-07-20 ENCOUNTER — Inpatient Hospital Stay (HOSPITAL_COMMUNITY): Payer: Medicaid Other | Admitting: Anesthesiology

## 2015-07-20 ENCOUNTER — Encounter (HOSPITAL_COMMUNITY): Payer: Self-pay | Admitting: *Deleted

## 2015-07-20 ENCOUNTER — Encounter: Payer: Medicaid Other | Admitting: Obstetrics and Gynecology

## 2015-07-20 DIAGNOSIS — Z833 Family history of diabetes mellitus: Secondary | ICD-10-CM | POA: Diagnosis not present

## 2015-07-20 DIAGNOSIS — Z3A39 39 weeks gestation of pregnancy: Secondary | ICD-10-CM | POA: Diagnosis not present

## 2015-07-20 DIAGNOSIS — Z6841 Body Mass Index (BMI) 40.0 and over, adult: Secondary | ICD-10-CM | POA: Diagnosis not present

## 2015-07-20 DIAGNOSIS — A6000 Herpesviral infection of urogenital system, unspecified: Secondary | ICD-10-CM | POA: Diagnosis present

## 2015-07-20 DIAGNOSIS — O99214 Obesity complicating childbirth: Secondary | ICD-10-CM | POA: Diagnosis present

## 2015-07-20 DIAGNOSIS — IMO0001 Reserved for inherently not codable concepts without codable children: Secondary | ICD-10-CM

## 2015-07-20 DIAGNOSIS — Z8249 Family history of ischemic heart disease and other diseases of the circulatory system: Secondary | ICD-10-CM

## 2015-07-20 DIAGNOSIS — O9952 Diseases of the respiratory system complicating childbirth: Secondary | ICD-10-CM | POA: Diagnosis present

## 2015-07-20 DIAGNOSIS — Z87891 Personal history of nicotine dependence: Secondary | ICD-10-CM | POA: Diagnosis not present

## 2015-07-20 DIAGNOSIS — O9832 Other infections with a predominantly sexual mode of transmission complicating childbirth: Secondary | ICD-10-CM | POA: Diagnosis present

## 2015-07-20 DIAGNOSIS — J45909 Unspecified asthma, uncomplicated: Secondary | ICD-10-CM | POA: Diagnosis present

## 2015-07-20 LAB — CBC
HCT: 34.2 % — ABNORMAL LOW (ref 36.0–46.0)
HEMOGLOBIN: 11.7 g/dL — AB (ref 12.0–15.0)
MCH: 25.5 pg — AB (ref 26.0–34.0)
MCHC: 34.2 g/dL (ref 30.0–36.0)
MCV: 74.5 fL — AB (ref 78.0–100.0)
Platelets: 310 10*3/uL (ref 150–400)
RBC: 4.59 MIL/uL (ref 3.87–5.11)
RDW: 17.3 % — ABNORMAL HIGH (ref 11.5–15.5)
WBC: 12.9 10*3/uL — ABNORMAL HIGH (ref 4.0–10.5)

## 2015-07-20 LAB — RPR: RPR: NONREACTIVE

## 2015-07-20 LAB — TYPE AND SCREEN
ABO/RH(D): O POS
Antibody Screen: NEGATIVE

## 2015-07-20 MED ORDER — SOD CITRATE-CITRIC ACID 500-334 MG/5ML PO SOLN: 30.0000 mL | ORAL | Status: DC | PRN

## 2015-07-20 MED ORDER — FENTANYL CITRATE (PF) 100 MCG/2ML IJ SOLN: 100.0000 ug | INTRAMUSCULAR | Status: DC | PRN

## 2015-07-20 MED ORDER — PRENATAL MULTIVITAMIN CH
1.0000 | ORAL_TABLET | Freq: Every day | ORAL | Status: DC
  Administered 2015-07-20 – 2015-07-21 (×2): 1 via ORAL
  Filled 2015-07-20 (×2): qty 1

## 2015-07-20 MED ORDER — ONDANSETRON HCL 4 MG/2ML IJ SOLN: 4.0000 mg | Freq: Four times a day (QID) | INTRAMUSCULAR | Status: DC | PRN

## 2015-07-20 MED ORDER — ACETAMINOPHEN 325 MG PO TABS
650.0000 mg | ORAL_TABLET | ORAL | Status: DC | PRN
  Administered 2015-07-21: 650 mg via ORAL
  Filled 2015-07-20: qty 2

## 2015-07-20 MED ORDER — PHENYLEPHRINE 40 MCG/ML (10ML) SYRINGE FOR IV PUSH (FOR BLOOD PRESSURE SUPPORT)
80.0000 ug | PREFILLED_SYRINGE | INTRAVENOUS | Status: DC | PRN
  Filled 2015-07-20: qty 5

## 2015-07-20 MED ORDER — SENNOSIDES-DOCUSATE SODIUM 8.6-50 MG PO TABS
2.0000 | ORAL_TABLET | ORAL | Status: DC
  Administered 2015-07-21 – 2015-07-22 (×2): 2 via ORAL
  Filled 2015-07-20 (×2): qty 2

## 2015-07-20 MED ORDER — TETANUS-DIPHTH-ACELL PERTUSSIS 5-2.5-18.5 LF-MCG/0.5 IM SUSP
0.5000 mL | Freq: Once | INTRAMUSCULAR | Status: DC
Start: 2015-07-21 — End: 2015-07-22

## 2015-07-20 MED ORDER — ZOLPIDEM TARTRATE 5 MG PO TABS: 5.0000 mg | ORAL_TABLET | Freq: Every evening | ORAL | Status: DC | PRN

## 2015-07-20 MED ORDER — IBUPROFEN 600 MG PO TABS
600.0000 mg | ORAL_TABLET | Freq: Four times a day (QID) | ORAL | Status: DC
  Administered 2015-07-20 – 2015-07-22 (×7): 600 mg via ORAL
  Filled 2015-07-20 (×7): qty 1

## 2015-07-20 MED ORDER — DIPHENHYDRAMINE HCL 25 MG PO CAPS: 25.0000 mg | ORAL_CAPSULE | Freq: Four times a day (QID) | ORAL | Status: DC | PRN

## 2015-07-20 MED ORDER — OXYCODONE-ACETAMINOPHEN 5-325 MG PO TABS: 1.0000 | ORAL_TABLET | ORAL | Status: DC | PRN

## 2015-07-20 MED ORDER — OXYCODONE-ACETAMINOPHEN 5-325 MG PO TABS: 2.0000 | ORAL_TABLET | ORAL | Status: DC | PRN

## 2015-07-20 MED ORDER — WITCH HAZEL-GLYCERIN EX PADS: 1.0000 "application " | MEDICATED_PAD | CUTANEOUS | Status: DC | PRN

## 2015-07-20 MED ORDER — LACTATED RINGERS IV SOLN
INTRAVENOUS | Status: DC
  Administered 2015-07-20 (×2): via INTRAVENOUS

## 2015-07-20 MED ORDER — FENTANYL 2.5 MCG/ML BUPIVACAINE 1/10 % EPIDURAL INFUSION (WH - ANES): 14.0000 mL/h | INTRAMUSCULAR | Status: DC | PRN

## 2015-07-20 MED ORDER — LIDOCAINE HCL (PF) 1 % IJ SOLN
30.0000 mL | INTRAMUSCULAR | Status: DC | PRN
  Filled 2015-07-20: qty 30

## 2015-07-20 MED ORDER — EPHEDRINE 5 MG/ML INJ
10.0000 mg | INTRAVENOUS | Status: DC | PRN
  Filled 2015-07-20: qty 2

## 2015-07-20 MED ORDER — OXYTOCIN 40 UNITS IN LACTATED RINGERS INFUSION - SIMPLE MED
2.5000 [IU]/h | INTRAVENOUS | Status: DC
  Filled 2015-07-20: qty 1000

## 2015-07-20 MED ORDER — ONDANSETRON HCL 4 MG/2ML IJ SOLN: 4.0000 mg | INTRAMUSCULAR | Status: DC | PRN

## 2015-07-20 MED ORDER — PHENYLEPHRINE 40 MCG/ML (10ML) SYRINGE FOR IV PUSH (FOR BLOOD PRESSURE SUPPORT)
PREFILLED_SYRINGE | INTRAVENOUS | Status: AC
  Filled 2015-07-20: qty 20

## 2015-07-20 MED ORDER — LIDOCAINE HCL (PF) 1 % IJ SOLN
INTRAMUSCULAR | Status: DC | PRN
  Administered 2015-07-20: 6 mL via EPIDURAL
  Administered 2015-07-20: 4 mL

## 2015-07-20 MED ORDER — OXYTOCIN BOLUS FROM INFUSION: 500.0000 mL | INTRAVENOUS | Status: DC

## 2015-07-20 MED ORDER — DIPHENHYDRAMINE HCL 50 MG/ML IJ SOLN: 12.5000 mg | INTRAMUSCULAR | Status: DC | PRN

## 2015-07-20 MED ORDER — ONDANSETRON HCL 4 MG PO TABS: 4.0000 mg | ORAL_TABLET | ORAL | Status: DC | PRN

## 2015-07-20 MED ORDER — LACTATED RINGERS IV SOLN
500.0000 mL | Freq: Once | INTRAVENOUS | Status: AC
  Administered 2015-07-20: 500 mL via INTRAVENOUS

## 2015-07-20 MED ORDER — COCONUT OIL OIL: 1.0000 "application " | TOPICAL_OIL | Status: DC | PRN

## 2015-07-20 MED ORDER — SIMETHICONE 80 MG PO CHEW: 80.0000 mg | CHEWABLE_TABLET | ORAL | Status: DC | PRN

## 2015-07-20 MED ORDER — OXYCODONE-ACETAMINOPHEN 5-325 MG PO TABS
1.0000 | ORAL_TABLET | ORAL | Status: DC | PRN
  Administered 2015-07-21 – 2015-07-22 (×4): 1 via ORAL
  Filled 2015-07-20 (×4): qty 1

## 2015-07-20 MED ORDER — BENZOCAINE-MENTHOL 20-0.5 % EX AERO: 1.0000 "application " | INHALATION_SPRAY | CUTANEOUS | Status: DC | PRN

## 2015-07-20 MED ORDER — DIBUCAINE 1 % RE OINT: 1.0000 "application " | TOPICAL_OINTMENT | RECTAL | Status: DC | PRN

## 2015-07-20 MED ORDER — LACTATED RINGERS IV SOLN: 500.0000 mL | INTRAVENOUS | Status: DC | PRN

## 2015-07-20 MED ORDER — FENTANYL 2.5 MCG/ML BUPIVACAINE 1/10 % EPIDURAL INFUSION (WH - ANES)
INTRAMUSCULAR | Status: AC
  Administered 2015-07-20: 14 mL/h via EPIDURAL
  Filled 2015-07-20: qty 125

## 2015-07-20 MED ORDER — ACETAMINOPHEN 325 MG PO TABS: 650.0000 mg | ORAL_TABLET | ORAL | Status: DC | PRN

## 2015-07-20 NOTE — H&P (Signed)
Alisha MeadDeshontala [name] is a 25 y.o. female  [phone] @ 39.5wks by LMP and 8wk scan presenting for reg ctx. Denies leaking or bldg; reports +FM. 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  History OB History    Gravida Para Term Preterm AB TAB SAB Ectopic Multiple Living   4 2 2  0 1 0 1 0 0 2     Past Medical History  Diagnosis Date  . Asthma   . Gonorrhea 2011  . Chlamydia 2011  . Vaginal Pap smear, abnormal   . HSV infection     no outbreak ever   Past Surgical History  Procedure Laterality Date  . Knee surgery  2007   Family History: family history includes Diabetes in her maternal grandmother; Hypertension in her maternal grandmother. There is no history of Anesthesia problems. Social History:  reports that she quit smoking about 9 months ago. Her smoking use included Cigarettes. She has a 1.25 pack-year smoking history. She has never used smokeless tobacco. She reports that she does not drink alcohol or use illicit drugs.   Prenatal Transfer Tool  Maternal Diabetes: No Genetic Screening: Declined Maternal Ultrasounds/Referrals: Normal Fetal Ultrasounds or other Referrals:  None Maternal Substance Abuse:  Yes:  Type: Smoker- quit w/ preg Significant Maternal Medications:  None Significant Maternal Lab Results:  Lab values include: Group B Strep negative Other Comments:  None  ROS  Dilation: 4 (Membranes bulging) Effacement (%): 70 Station: -2 Exam by:: Dellie BurnsScarlett Murray, RN BSN Blood pressure 131/85, pulse 110, temperature 98.2 F (36.8 C), temperature source Oral, resp. rate 18, height 5\' 5"  (1.651 m), weight 132.45 kg (292 lb), last menstrual period 10/15/2014. Exam Physical Exam  Constitutional: She is oriented to person, place, and time. She appears well-developed.  HENT:  Head: Normocephalic.  Neck: Normal range of motion.  Cardiovascular: Normal rate.    Respiratory: Effort normal.  GI:  EFM 140s, +accels, no decels Ctx q 4 mins  Genitourinary:  Cx change per RN from 2.5/60 to 4/70  Musculoskeletal: Normal range of motion.  Neurological: She is alert and oriented to person, place, and time.  Skin: Skin is warm and dry.  Psychiatric: She has a normal mood and affect. Her behavior is normal. Thought content normal.    Prenatal labs: ABO, Rh: O/POS/-- (12/01 0001) Antibody: NEG (12/01 0001) Rubella: 2.78 (12/01 0001) RPR: NON REAC (03/29 1048)  HBsAg: NEGATIVE (12/01 0001)  HIV: NONREACTIVE (03/29 1048)  GBS:   neg 06/22/15  Assessment/Plan: IUP@term  Latent phase  GBS neg  [facility] Anticipate SVD   Cam HaiSHAW, Jazminn Pomales CNM 07/20/2015, 2:46 AM

## 2015-07-20 NOTE — Anesthesia Procedure Notes (Signed)
Epidural Patient location during procedure: OB  Preanesthetic Checklist Completed: patient identified, site marked, surgical consent, pre-op evaluation, timeout performed, IV checked, risks and benefits discussed and monitors and equipment checked  Epidural Patient position: sitting Prep: site prepped and draped and DuraPrep Patient monitoring: continuous pulse ox and blood pressure Approach: midline Location: L3-L4 Injection technique: LOR air  Needle:  Needle type: Tuohy  Needle gauge: 17 G Needle length: 9 cm and 9 Needle insertion depth: 9 cm Catheter type: closed end flexible Catheter size: 19 Gauge Catheter at skin depth: 15 cm Test dose: negative  Assessment Events: blood not aspirated, injection not painful, no injection resistance, negative IV test and no paresthesia  Additional Notes Dosing of Epidural:  1st dose, through catheter ............................................Marland Kitchen.  Xylocaine 40 mg  2nd dose, through catheter, after waiting 3 minutes........Marland Kitchen.Xylocaine 60 mg    As each dose occurred, patient was free of IV sx; and patient exhibited no evidence of SA injection.  Patient is more comfortable after epidural dosed. Please see RN's note for documentation of vital signs,and FHR which are stable.  Patient reminded not to try to ambulate with numb legs, and that an RN must be present when she attempts to get up.

## 2015-07-20 NOTE — MAU Note (Signed)
Patient presents at 7639 weeks gestation with c/o contractions every 4 minutes since 2130 today. Fetus active. Denies bleeding but has noticed mucus discharge.

## 2015-07-20 NOTE — Anesthesia Preprocedure Evaluation (Addendum)

## 2015-07-20 NOTE — Anesthesia Pain Management Evaluation Note (Signed)
  CRNA Pain Management Visit Note  Patient: [name], 25 y.o., female  "Hello I am a member of the anesthesia team at [facility]. We have an anesthesia team available at all times to provide care throughout the hospital, including epidural management and anesthesia for C-section. I don't know your plan for the delivery whether it a natural birth, water birth, IV sedation, nitrous supplementation, doula or epidural, but we want to meet your pain goals."   1.Was your pain managed to your expectations on prior hospitalizations?   Yes   2.What is your expectation for pain management during this hospitalization?     Epidural  3.How can we help you reach that goal? Epidural, RN preparing patient for epidural  Record the patient's initial score and the patient's pain goal.   Pain:   Pain Goal: 4 [facility] wants you to be able to say your pain was always managed very well.  Shellia Hartl 07/20/2015

## 2015-07-21 LAB — CBC
HEMATOCRIT: 32.5 % — AB (ref 36.0–46.0)
HEMOGLOBIN: 10.9 g/dL — AB (ref 12.0–15.0)
MCH: 24.8 pg — ABNORMAL LOW (ref 26.0–34.0)
MCHC: 33.5 g/dL (ref 30.0–36.0)
MCV: 74 fL — AB (ref 78.0–100.0)
PLATELETS: 282 10*3/uL (ref 150–400)
RBC: 4.39 MIL/uL (ref 3.87–5.11)
RDW: 16.8 % — ABNORMAL HIGH (ref 11.5–15.5)
WBC: 12.4 10*3/uL — AB (ref 4.0–10.5)

## 2015-07-21 NOTE — Anesthesia Postprocedure Evaluation (Signed)
Anesthesia Post Note  Patient: Alisha CairoDeshontala Farone  Procedure(s) Performed: * No procedures listed *  Patient location during evaluation: Mother Baby Anesthesia Type: Epidural Level of consciousness: awake Pain management: pain level controlled Vital Signs Assessment: post-procedure vital signs reviewed and stable Respiratory status: spontaneous breathing Cardiovascular status: stable Postop Assessment: no headache, no backache, epidural receding and patient able to bend at knees Anesthetic complications: no     Last Vitals:  Filed Vitals:   07/20/15 1725 07/21/15 0611  BP: 132/68 120/67  Pulse: 82 88  Temp: 37.2 C 36.7 C  Resp: 16 18    Last Pain:  Filed Vitals:   07/21/15 0855  PainSc: 6    Pain Goal: Patients Stated Pain Goal: 10 (07/20/15 0610)               Edison PaceWILKERSON,Grae Leathers

## 2015-07-21 NOTE — Lactation Note (Signed)
This note was copied from a baby's chart. Lactation Consultation Note: Mother started breastfeeding infant , but now has changed to bottle feeding. Mother states that she has two other children at home and bottle feeding will work better for her. Mother has a harmony hand pump at the bedside. Offered services as needed.   Patient Name: [name] ZOXWR'UToday's Date: 07/21/2015 Reason for consult: Initial assessment   Maternal Data    Feeding    LATCH Score/Interventions                      Lactation Tools Discussed/Used     Consult Status Consult Status: Complete    Michel BickersKendrick, Shreyansh Tiffany McCoy 07/21/2015, 3:11 PM

## 2015-07-21 NOTE — Progress Notes (Signed)
Post Partum Day 1 Subjective: no complaints, up ad lib, voiding and tolerating PO, small lochia, plans to breastfeed, plans to bottle feed,   Objective: Blood pressure 120/67, pulse 88, temperature 98.1 F (36.7 C), temperature source Oral, resp. rate 18, height 5\' 5"  (1.651 m), weight 132.45 kg (292 lb), last menstrual period 10/15/2014, SpO2 99 %, unknown if currently breastfeeding.  Physical Exam:  General: alert, cooperative and no distress Lochia:normal flow Chest: CTAB Heart: RRR no m/r/g Abdomen: +BS, soft, nontender,  Uterine Fundus: firm DVT Evaluation: No evidence of DVT seen on physical exam. Extremities: trace edema   Recent Labs  07/20/15 0250 07/21/15 0514  HGB 11.7* 10.9*  HCT 34.2* 32.5*    Assessment/Plan: Plan for discharge tomorrow   LOS: 1 day   [name],[name] 07/21/2015, 8:56 AM

## 2015-07-22 MED ORDER — IBUPROFEN 600 MG PO TABS: 600.0000 mg | ORAL_TABLET | Freq: Four times a day (QID) | ORAL | Status: DC | PRN

## 2015-07-22 NOTE — Discharge Instructions (Signed)

## 2015-07-22 NOTE — Discharge Summary (Signed)
OB Discharge Summary     Patient Name: [name] DOB: March 04, 1990 MRN: [id number]  Date of admission: 07/20/2015 Delivering MD: Levie HeritageSTINSON, JACOB J   Date of discharge: 07/22/2015  Admitting diagnosis: 39 WEEKS CTX Intrauterine pregnancy: 1776w5d     Secondary diagnosis:  Active Problems:   Active labor at term  Additional problems: +HSV 2 titers, no outbreak ever     Discharge diagnosis: Term Pregnancy Delivered                                                                                                Post partum procedures:none  Augmentation: none  Complications: None  Hospital course:  Onset of Labor With Vaginal Delivery     [age] y.o. yo [phone] at 7176w5d was admitted in Latent Labor on 07/20/2015. Patient had an uncomplicated labor course as follows:  Membrane Rupture Time/Date: 10:00 AM ,07/20/2015   Intrapartum Procedures: Episiotomy: None [1]                                         Lacerations:  None [1]  Patient had a delivery of a Viable infant. 07/20/2015  Information for the patient's newborn:  Gwendlyn DeutscherChisholm, Boy Noraa [[id number]][[id number]]  Delivery Method: Vaginal, Spontaneous Delivery (Filed from Delivery Summary)    [name] had an uncomplicated postpartum course.  She is ambulating, tolerating a regular diet, passing flatus, and urinating well. Patient is discharged home in stable condition on 07/22/2015.    Physical exam  Filed Vitals:   07/20/15 1725 07/21/15 0611 07/21/15 1700 07/22/15 0601  BP: 132/68 120/67 123/88 133/95  Pulse: 82 88 82 83  Temp: 99 F (37.2 C) 98.1 F (36.7 C) 98 F (36.7 C)   TempSrc: Oral Oral Oral   Resp: 16 18 18 18   Height:      Weight:      SpO2:  99%     General: alert and cooperative Lochia: appropriate Uterine Fundus: firm Incision: N/A DVT Evaluation: No evidence of DVT seen on physical exam. Labs: Lab Results  Component Value Date   WBC 12.4* 07/21/2015   HGB 10.9* 07/21/2015   HCT 32.5* 07/21/2015   MCV  74.0* 07/21/2015   PLT 282 07/21/2015   CMP Latest Ref Rng 05/01/2014  Glucose 70 - 99 mg/dL 88  BUN 6 - 23 mg/dL 15  Creatinine 2.950.50 - 2.841.10 mg/dL 1.320.68  Sodium 440135 - 102145 mmol/L 139  Potassium 3.5 - 5.1 mmol/L 3.8  Chloride 96 - 112 mmol/L 108  CO2 19 - 32 mmol/L 25  Calcium 8.4 - 10.5 mg/dL 8.6  Total Protein 6.0 - 8.3 g/dL 6.3  Total Bilirubin 0.3 - 1.2 mg/dL 0.3  Alkaline Phos 39 - 117 U/L 72  AST 0 - 37 U/L 15  ALT 0 - 35 U/L 14    Discharge instruction: per After Visit Summary and "Baby and Me Booklet".  After visit meds:    Medication List    STOP taking these medications  cetirizine 10 MG tablet  Commonly known as:  ZYRTEC     cyclobenzaprine 10 MG tablet  Commonly known as:  FLEXERIL     valACYclovir 1000 MG tablet  Commonly known as:  VALTREX      TAKE these medications        fluticasone 50 MCG/ACT nasal spray  Commonly known as:  FLONASE  Place 1 spray into both nostrils daily.     ibuprofen 600 MG tablet  Commonly known as:  ADVIL,MOTRIN  Take 1 tablet (600 mg total) by mouth every 6 (six) hours as needed.     PRENATAL PLUS DHA 7-0.4-100 MG Tabs  Take 1 tablet by mouth daily.        Diet: routine diet  Activity: Advance as tolerated. Pelvic rest for 6 weeks.   Outpatient follow up:6 weeks Follow up Appt:No future appointments. Follow up Visit:No Follow-up on file.  Postpartum contraception: Undecided  Newborn Data: Live born female  Birth Weight: 6 lb 4 oz (2835 g) APGAR: 9, 9  Baby Feeding: Bottle Disposition:home with mother   07/22/2015 Cam HaiSHAW, Demaryius Imran, CNM  9:47 AM

## 2015-09-04 ENCOUNTER — Ambulatory Visit: Payer: Medicaid Other | Admitting: Family Medicine

## 2015-09-11 ENCOUNTER — Ambulatory Visit: Payer: Medicaid Other | Admitting: Obstetrics and Gynecology

## 2016-08-13 ENCOUNTER — Inpatient Hospital Stay (HOSPITAL_COMMUNITY)
Admission: AD | Admit: 2016-08-13 | Discharge: 2016-08-13 | Disposition: A | Discharge status: Home / Self Care | Payer: Self-pay | Source: Ambulatory Visit | Attending: Family Medicine | Admitting: Family Medicine

## 2016-08-13 ENCOUNTER — Inpatient Hospital Stay (HOSPITAL_COMMUNITY): Payer: Self-pay

## 2016-08-13 ENCOUNTER — Encounter (HOSPITAL_COMMUNITY): Payer: Self-pay | Admitting: *Deleted

## 2016-08-13 DIAGNOSIS — O26891 Other specified pregnancy related conditions, first trimester: Secondary | ICD-10-CM | POA: Insufficient documentation

## 2016-08-13 DIAGNOSIS — Z87891 Personal history of nicotine dependence: Secondary | ICD-10-CM | POA: Insufficient documentation

## 2016-08-13 DIAGNOSIS — Z3A01 Less than 8 weeks gestation of pregnancy: Secondary | ICD-10-CM | POA: Insufficient documentation

## 2016-08-13 DIAGNOSIS — O3680X0 Pregnancy with inconclusive fetal viability, not applicable or unspecified: Secondary | ICD-10-CM

## 2016-08-13 DIAGNOSIS — R109 Unspecified abdominal pain: Secondary | ICD-10-CM | POA: Insufficient documentation

## 2016-08-13 LAB — WET PREP, GENITAL
Clue Cells Wet Prep HPF POC: NONE SEEN
SPERM: NONE SEEN
Trich, Wet Prep: NONE SEEN
Yeast Wet Prep HPF POC: NONE SEEN

## 2016-08-13 LAB — URINALYSIS, ROUTINE W REFLEX MICROSCOPIC
BACTERIA UA: NONE SEEN
Bilirubin Urine: NEGATIVE
Glucose, UA: NEGATIVE mg/dL
Ketones, ur: NEGATIVE mg/dL
LEUKOCYTES UA: NEGATIVE
NITRITE: NEGATIVE
PH: 6 (ref 5.0–8.0)
Protein, ur: NEGATIVE mg/dL
SPECIFIC GRAVITY, URINE: 1.019 (ref 1.005–1.030)

## 2016-08-13 LAB — CBC
HEMATOCRIT: 38 % (ref 36.0–46.0)
Hemoglobin: 12.6 g/dL (ref 12.0–15.0)
MCH: 25.5 pg — AB (ref 26.0–34.0)
MCHC: 33.2 g/dL (ref 30.0–36.0)
MCV: 76.9 fL — AB (ref 78.0–100.0)
Platelets: 328 10*3/uL (ref 150–400)
RBC: 4.94 MIL/uL (ref 3.87–5.11)
RDW: 16.4 % — AB (ref 11.5–15.5)
WBC: 13.6 10*3/uL — ABNORMAL HIGH (ref 4.0–10.5)

## 2016-08-13 LAB — POCT PREGNANCY, URINE: PREG TEST UR: POSITIVE — AB

## 2016-08-13 LAB — HCG, QUANTITATIVE, PREGNANCY: hCG, Beta Chain, Quant, S: 530 m[IU]/mL — ABNORMAL HIGH (ref <5)

## 2016-08-13 NOTE — MAU Provider Note (Signed)
Chief Complaint: Abdominal Pain and Possible Pregnancy   None     SUBJECTIVE HPI: [name] is a 26 y.o. [id number] at 5855w5d by LMP who presents to maternity admissions reporting abdominal pain.  Her pain started today and was low in the front of her abdomen and intermittent, cramping like menstrual cramps, and resolved spontaneously before arrival in MAU.  She did not try any treatments.  Nothing made the pain better or worse. There are no associated symptoms.   She denies vaginal bleeding, vaginal itching/burning, urinary symptoms, h/a, dizziness, n/v, or fever/chills.     HPI  Past Medical History:  Diagnosis Date  . Asthma   . Chlamydia 2011  . Gonorrhea 2011  . 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 Vaginal Pap smear, abnormal    Past Surgical History:  Procedure Laterality Date  . KNEE SURGERY  2007   Social History   Social History  . Marital status: Single    Spouse name: N/A  . Number of children: 1  . Years of education: College   Occupational History  .      n/a   Social History Main Topics  . Smoking status: Former Smoker    Packs/day: 0.25    Years: 5.00    Types: Cigarettes    Quit date: 10/17/2014  . Smokeless tobacco: Never Used  . Alcohol use No     Comment: Quit: 03/17/12  . Drug use: No  . Sexual activity: Yes    Birth control/ protection: None     Comment: not in last week   Other Topics Concern  . Not on file   Social History Narrative   Patient lives at home with her family   Caffeine Use: medication   No current facility-administered medications on file prior to encounter.    Current Outpatient Prescriptions on File Prior to Encounter  Medication Sig Dispense Refill  . fluticasone (FLONASE) 50 MCG/ACT nasal spray Place 1 spray into both nostrils daily. 16 g 2  . ibuprofen (ADVIL,MOTRIN) 600 MG tablet Take 1 tablet (600 mg total) by mouth every 6 (six) hours as needed. 30 tablet 0  . Prenatal MV-Min-Fe Cbn-FA-DHA (PRENATAL PLUS  DHA) 7-0.4-100 MG TABS Take 1 tablet by mouth daily. 60 each 5   No Known Allergies  ROS:  Review of Systems  Constitutional: Negative for chills, fatigue and fever.  Respiratory: Negative for shortness of breath.   Cardiovascular: Negative for chest pain.  Gastrointestinal: Positive for abdominal pain. Negative for constipation, diarrhea, nausea and vomiting.  Genitourinary: Positive for pelvic pain. Negative for difficulty urinating, dysuria, flank pain, vaginal bleeding, vaginal discharge and vaginal pain.  Neurological: Negative for dizziness and headaches.  Psychiatric/Behavioral: Negative.      I have reviewed patient's Past Medical Hx, Surgical Hx, Family Hx, Social Hx, medications and allergies.   Physical Exam  Patient Vitals for the past 24 hrs:  BP Temp Temp src Pulse Resp SpO2 Weight  08/13/16 1629 140/89 98.2 F (36.8 C) Oral 80 16 99 % (!) 329 lb (149.2 kg)   Constitutional: Well-developed, well-nourished female in no acute distress.  Cardiovascular: normal rate Respiratory: normal effort GI: Abd soft, non-tender. Pos BS x 4 MS: Extremities nontender, no edema, normal ROM Neurologic: Alert and oriented x 4.  GU: Neg CVAT.  PELVIC EXAM: Cervix pink, visually closed, without lesion, scant white creamy discharge, vaginal walls and external genitalia normal Bimanual exam: Cervix 0/long/high, firm, anterior, neg CMT, uterus nontender, nonenlarged,  adnexa without tenderness, enlargement, or mass   LAB RESULTS Results for orders placed or performed during the hospital encounter of 08/13/16 (from the past 24 hour(s))  Urinalysis, Routine w reflex microscopic     Status: Abnormal   Collection Time: 08/13/16  4:30 PM  Result Value Ref Range   Color, Urine YELLOW YELLOW   APPearance CLEAR CLEAR   Specific Gravity, Urine 1.019 1.005 - 1.030   pH 6.0 5.0 - 8.0   Glucose, UA NEGATIVE NEGATIVE mg/dL   Hgb urine dipstick SMALL (A) NEGATIVE   Bilirubin Urine NEGATIVE  NEGATIVE   Ketones, ur NEGATIVE NEGATIVE mg/dL   Protein, ur NEGATIVE NEGATIVE mg/dL   Nitrite NEGATIVE NEGATIVE   Leukocytes, UA NEGATIVE NEGATIVE   RBC / HPF 0-5 0 - 5 RBC/hpf   WBC, UA 0-5 0 - 5 WBC/hpf   Bacteria, UA NONE SEEN NONE SEEN   Squamous Epithelial / LPF 0-5 (A) NONE SEEN   Mucous PRESENT   Pregnancy, urine POC     Status: Abnormal   Collection Time: 08/13/16  4:40 PM  Result Value Ref Range   Preg Test, Ur POSITIVE (A) NEGATIVE  CBC     Status: Abnormal   Collection Time: 08/13/16  5:06 PM  Result Value Ref Range   WBC 13.6 (H) 4.0 - 10.5 K/uL   RBC 4.94 3.87 - 5.11 MIL/uL   Hemoglobin 12.6 12.0 - 15.0 g/dL   HCT 16.1 09.6 - 04.5 %   MCV 76.9 (L) 78.0 - 100.0 fL   MCH 25.5 (L) 26.0 - 34.0 pg   MCHC 33.2 30.0 - 36.0 g/dL   RDW 40.9 (H) 81.1 - 91.4 %   Platelets 328 150 - 400 K/uL  hCG, quantitative, pregnancy     Status: Abnormal   Collection Time: 08/13/16  5:06 PM  Result Value Ref Range   hCG, Beta Chain, Quant, S 530 (H) <5 mIU/mL  Wet prep, genital     Status: Abnormal   Collection Time: 08/13/16  7:59 PM  Result Value Ref Range   Yeast Wet Prep HPF POC NONE SEEN NONE SEEN   Trich, Wet Prep NONE SEEN NONE SEEN   Clue Cells Wet Prep HPF POC NONE SEEN NONE SEEN   WBC, Wet Prep HPF POC FEW (A) NONE SEEN   Sperm NONE SEEN        IMAGING US Ob Comp Less 14 Wks  Result Date: 08/13/2016 CLINICAL DATA:  26 year old pregnant female with abdominal cramping since this morning. EXAM: OBSTETRIC <14 WK [place] AND TRANSVAGINAL OB US TECHNIQUE: Both transabdominal and transvaginal ultrasound examinations were performed for complete evaluation of the gestation as well as the maternal uterus, adnexal regions, and pelvic cul-de-sac. Transvaginal technique was performed to assess early pregnancy. COMPARISON:  OB ultrasound 05/02/2015. FINDINGS: Intrauterine gestational sac: None. Yolk sac:  None. Embryo:  None. Cardiac Activity: N/A Heart Rate: N/A Subchorionic hemorrhage:   None visualized. Maternal uterus/adnexae: Small amount of fluid in the endometrial canal. Probable degenerating corpus luteum cyst in the right ovary. Left ovary is normal in appearance. Trace volume of free fluid in the cul-de-sac. IMPRESSION: 1. No IUP identified. Electronically Signed   By: Trudie Reed M.D.   On: 08/13/2016 19:15   US Ob Transvaginal  Result Date: 08/13/2016 CLINICAL DATA:  26 year old pregnant female with abdominal cramping since this morning. EXAM: OBSTETRIC <14 WK [place] AND TRANSVAGINAL OB US TECHNIQUE: Both transabdominal and transvaginal ultrasound examinations were performed for complete evaluation of the gestation  as well as the maternal uterus, adnexal regions, and pelvic cul-de-sac. Transvaginal technique was performed to assess early pregnancy. COMPARISON:  OB ultrasound 05/02/2015. FINDINGS: Intrauterine gestational sac: None. Yolk sac:  None. Embryo:  None. Cardiac Activity: N/A Heart Rate: N/A Subchorionic hemorrhage:  None visualized. Maternal uterus/adnexae: Small amount of fluid in the endometrial canal. Probable degenerating corpus luteum cyst in the right ovary. Left ovary is normal in appearance. Trace volume of free fluid in the cul-de-sac. IMPRESSION: 1. No IUP identified. Electronically Signed   By: Trudie Reed M.D.   On: 08/13/2016 19:15    MAU Management/MDM: Ordered labs and [place] and reviewed results.  Findings today could represent a normal early pregnancy, spontaneous abortion or ectopic pregnancy which can be life-threatening.  Ectopic precautions were given to the patient with plan to return in 48 hours for repeat quant hcg to evaluate pregnancy development. Pt scheduled in [facility] office on Friday 7/27 at 8 am for repeat hcg and to return to MAU sooner for emergencies. Pt stable at time of discharge.  ASSESSMENT 1. Pregnancy of unknown anatomic location   2. Abdominal pain during pregnancy in first trimester     PLAN Discharge home with ectopic  precautions  Allergies as of 08/13/2016   No Known Allergies     Medication List    STOP taking these medications   ibuprofen 600 MG tablet Commonly known as:  ADVIL,MOTRIN     TAKE these medications   fluticasone 50 MCG/ACT nasal spray Commonly known as:  FLONASE Place 1 spray into both nostrils daily.   PRENATAL PLUS DHA 7-0.4-100 MG Tabs Take 1 tablet by mouth daily.      Follow-up Information    [facility].   Specialty:  Obstetrics and Gynecology Why:  On Friday morning at 8 am for repeat labs. Return to MAU sooner as needed for emergencies. Contact information: [address] [place] [address] [phone]          Sharen Counter Certified Nurse-Midwife 08/13/2016  8:39 PM

## 2016-08-13 NOTE — MAU Note (Signed)
Been having some cramps, last few hours.  Yesterday took 3 HPTs- all were poitive.

## 2016-08-14 LAB — HIV ANTIBODY (ROUTINE TESTING W REFLEX): HIV SCREEN 4TH GENERATION: NONREACTIVE

## 2016-08-14 LAB — GC/CHLAMYDIA PROBE AMP (~~LOC~~) NOT AT ARMC
CHLAMYDIA, DNA PROBE: NEGATIVE
NEISSERIA GONORRHEA: NEGATIVE

## 2016-08-16 ENCOUNTER — Ambulatory Visit: Payer: Self-pay

## 2016-08-16 ENCOUNTER — Inpatient Hospital Stay (HOSPITAL_COMMUNITY)
Admission: AD | Admit: 2016-08-16 | Discharge: 2016-08-16 | Disposition: A | Discharge status: Home / Self Care | Payer: Self-pay | Source: Ambulatory Visit | Attending: Obstetrics and Gynecology | Admitting: Obstetrics and Gynecology

## 2016-08-16 DIAGNOSIS — Z87891 Personal history of nicotine dependence: Secondary | ICD-10-CM | POA: Insufficient documentation

## 2016-08-16 DIAGNOSIS — O26891 Other specified pregnancy related conditions, first trimester: Secondary | ICD-10-CM | POA: Insufficient documentation

## 2016-08-16 DIAGNOSIS — Z09 Encounter for follow-up examination after completed treatment for conditions other than malignant neoplasm: Secondary | ICD-10-CM | POA: Insufficient documentation

## 2016-08-16 DIAGNOSIS — Z3A01 Less than 8 weeks gestation of pregnancy: Secondary | ICD-10-CM | POA: Insufficient documentation

## 2016-08-16 DIAGNOSIS — O99511 Diseases of the respiratory system complicating pregnancy, first trimester: Secondary | ICD-10-CM | POA: Insufficient documentation

## 2016-08-16 DIAGNOSIS — J45909 Unspecified asthma, uncomplicated: Secondary | ICD-10-CM | POA: Insufficient documentation

## 2016-08-16 DIAGNOSIS — Z79899 Other long term (current) drug therapy: Secondary | ICD-10-CM | POA: Insufficient documentation

## 2016-08-16 DIAGNOSIS — O3680X0 Pregnancy with inconclusive fetal viability, not applicable or unspecified: Secondary | ICD-10-CM

## 2016-08-16 DIAGNOSIS — O26899 Other specified pregnancy related conditions, unspecified trimester: Secondary | ICD-10-CM

## 2016-08-16 DIAGNOSIS — R109 Unspecified abdominal pain: Secondary | ICD-10-CM | POA: Insufficient documentation

## 2016-08-16 LAB — HCG, QUANTITATIVE, PREGNANCY: hCG, Beta Chain, Quant, S: 1832 m[IU]/mL — ABNORMAL HIGH (ref <5)

## 2016-08-16 NOTE — MAU Provider Note (Signed)
History   [id number]   Chief Complaint  Patient presents with  . Follow-up    HPI [name] is a 26 y.o. female [phone] here for follow-up BHCG.  Upon review of the records patient was first seen on 7/24 for abdominal pain. BHCG on that day was 530. Ultrasound showed no IUP. GC/CT and wet prep were collected.  Results were negative. Pt discharged home with ectopic precautions. Pt here today with no report of abdominal pain or vaginal bleeding.Patient's last menstrual period was 07/11/2016.  OB History  Gravida Para Term Preterm AB Living  5 3 3  0 1 3  SAB TAB Ectopic Multiple Live Births  1 0 0 0 3    # Outcome Date GA Lbr Len/2nd Weight Sex Delivery Anes PTL Lv  5 Current           4 Term 07/20/15 2447w5d 17:23 / 00:08 6 lb 4 oz (2.835 kg) M Vag-Spont EPI  LIV  3 Term 12/08/12 469w6d 09:45 / 00:14 5 lb 10.7 oz (2.57 kg) F Vag-Spont EPI  LIV  2 Term 05/02/11 2237w1d  5 lb 14.5 oz (2.679 kg) F Vag-Spont EPI  LIV     Birth Comments: nasal bifid  1 SAB               Past Medical History:  Diagnosis Date  . Asthma   . Chlamydia 2011  . Gonorrhea 2011  . 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 Vaginal Pap smear, abnormal     Family History  Problem Relation Age of Onset  . Diabetes Maternal Grandmother   . Hypertension Maternal Grandmother   . Anesthesia problems Neg Hx     Social History   Social History  . Marital status: Single    Spouse name: N/A  . Number of children: 1  . Years of education: College   Occupational History  .      n/a   Social History Main Topics  . Smoking status: Former Smoker    Packs/day: 0.25    Years: 5.00    Types: Cigarettes    Quit date: 10/17/2014  . Smokeless tobacco: Never Used  . Alcohol use No     Comment: Quit: 03/17/12  . Drug use: No  . Sexual activity: Yes    Birth control/ protection: None     Comment: not in last week   Other Topics Concern  . Not on file   Social History Narrative   Patient lives at home  with her family   Caffeine Use: medication    No Known Allergies  No current facility-administered medications on file prior to encounter.    Current Outpatient Prescriptions on File Prior to Encounter  Medication Sig Dispense Refill  . fluticasone (FLONASE) 50 MCG/ACT nasal spray Place 1 spray into both nostrils daily. 16 g 2  . Prenatal MV-Min-Fe Cbn-FA-DHA (PRENATAL PLUS DHA) 7-0.4-100 MG TABS Take 1 tablet by mouth daily. 60 each 5     Physical Exam   Vitals:   08/16/16 1119  BP: (!) 145/79  Pulse: 80  Resp: 18  Temp: 98.2 F (36.8 C)  TempSrc: Oral  SpO2: 98%    Physical Exam  Nursing note and vitals reviewed. Constitutional: She is oriented to person, place, and time. She appears well-developed and well-nourished. No distress.  HENT:  Head: Normocephalic and atraumatic.  Eyes: Conjunctivae are normal. Right eye exhibits no discharge. Left eye exhibits no discharge. No scleral icterus.  Neck: Normal range of motion.  Respiratory: Effort normal. No respiratory distress.  Neurological: She is alert and oriented to person, place, and time.  Skin: Skin is warm and dry. She is not diaphoretic.  Psychiatric: She has a normal mood and affect. Her behavior is normal. Judgment and thought content normal.    MAU Course  Procedures Results for orders placed or performed during the hospital encounter of 08/16/16 (from the past 24 hour(s))  hCG, quantitative, pregnancy     Status: Abnormal   Collection Time: 08/16/16 11:25 AM  Result Value Ref Range   hCG, Beta Chain, Quant, S 1,832 (H) <5 mIU/mL    [id number] Patient asymptomatic today.  Will have patient return Sunday for repeat BHCG; if appropriate rise continues will consider outpatient ultrasound for viability.  Assessment and Plan  26 y.o. [id number] at 1644w1d wks Pregnancy Follow-up BHCG Pregnancy of Unknown Location 1. Abdominal pain affecting pregnancy   2. Pregnancy of unknown  anatomic location    P: Discharge home Return to MAU Sunday for repeat BHCG Discussed reasons to return to MAU including s/s ectopic pregnancy  Judeth HornLawrence, Daneen Volcy, NP 08/16/2016 12:41 PM

## 2016-08-16 NOTE — MAU Note (Signed)
Here for f/u appt.  Doing ok, denies pain or bleeding.

## 2016-08-16 NOTE — Progress Notes (Deleted)
Pt arrived late for her stat hcg around 1100 am . Advised that results wouldn't be back before clinic closed at 12. Pt to MAU.

## 2016-08-16 NOTE — Discharge Instructions (Signed)
Return to care  °· If you have heavier bleeding that soaks through more that 2 pads per hour for an hour or more °· If you bleed so much that you feel like you might pass out or you do pass out °· If you have significant abdominal pain that is not improved with Tylenol  °· If you develop a fever > 100.5 ° °

## 2016-08-19 ENCOUNTER — Inpatient Hospital Stay (HOSPITAL_COMMUNITY)
Admission: AD | Admit: 2016-08-19 | Discharge: 2016-08-19 | Disposition: A | Discharge status: Home / Self Care | Payer: Self-pay | Source: Ambulatory Visit | Attending: Obstetrics & Gynecology | Admitting: Obstetrics & Gynecology

## 2016-08-19 DIAGNOSIS — O3680X0 Pregnancy with inconclusive fetal viability, not applicable or unspecified: Secondary | ICD-10-CM

## 2016-08-19 DIAGNOSIS — N898 Other specified noninflammatory disorders of vagina: Secondary | ICD-10-CM

## 2016-08-19 DIAGNOSIS — Z3A01 Less than 8 weeks gestation of pregnancy: Secondary | ICD-10-CM | POA: Insufficient documentation

## 2016-08-19 DIAGNOSIS — O9989 Other specified diseases and conditions complicating pregnancy, childbirth and the puerperium: Secondary | ICD-10-CM

## 2016-08-19 DIAGNOSIS — Z87891 Personal history of nicotine dependence: Secondary | ICD-10-CM | POA: Insufficient documentation

## 2016-08-19 DIAGNOSIS — Z3490 Encounter for supervision of normal pregnancy, unspecified, unspecified trimester: Secondary | ICD-10-CM | POA: Insufficient documentation

## 2016-08-19 LAB — HCG, QUANTITATIVE, PREGNANCY: hCG, Beta Chain, Quant, S: 4628 m[IU]/mL — ABNORMAL HIGH (ref <5)

## 2016-08-19 NOTE — Discharge Instructions (Signed)
Ectopic Pregnancy An ectopic pregnancy is when the fertilized egg attaches (implants) outside the uterus. Most ectopic pregnancies occur in one of the tubes where eggs travel from the ovary to the uterus (fallopian tubes), but the implanting can occur in other locations. In rare cases, ectopic pregnancies occur on the ovary, intestine, pelvis, abdomen, or cervix. In an ectopic pregnancy, the fertilized egg does not have the ability to develop into a normal, healthy baby. A ruptured ectopic pregnancy is one in which tearing or bursting of a fallopian tube causes internal bleeding. Often, there is intense lower abdominal pain, and vaginal bleeding sometimes occurs. Having an ectopic pregnancy can be life-threatening. If this dangerous condition is not treated, it can lead to blood loss, shock, or even death. What are the causes? The most common cause of this condition is damage to one of the fallopian tubes. A fallopian tube may be narrowed or blocked, and that keeps the fertilized egg from reaching the uterus. What increases the risk? This condition is more likely to develop in women of childbearing age who have different levels of risk. The levels of risk can be divided into three categories. High risk  You have gone through infertility treatment.  You have had an ectopic pregnancy before.  You have had surgery on the fallopian tubes, or another surgical procedure, such as an abortion.  You have had surgery to have the fallopian tubes tied (tubal ligation).  You have problems or diseases of the fallopian tubes.  You have been exposed to diethylstilbestrol (DES). This medicine was used until 1971, and it had effects on babies whose mothers took the medicine.  You become pregnant while using an IUD (intrauterine device) for birth control. Moderate risk  You have a history of infertility.  You have had an STI (sexually transmitted infection).  You have a history of pelvic inflammatory  disease (PID).  You have scarring from endometriosis.  You have multiple sexual partners.  You smoke. Low risk  You have had pelvic surgery.  You use vaginal douches.  You became sexually active before age 18. What are the signs or symptoms? Common symptoms of this condition include normal pregnancy symptoms, such as missing a period, nausea, tiredness, abdominal pain, breast tenderness, and bleeding. However, ectopic pregnancy will have additional symptoms, such as:  Pain with intercourse.  Irregular vaginal bleeding or spotting.  Cramping or pain on one side or in the lower abdomen.  Fast heartbeat, low blood pressure, and sweating.  Passing out while having a bowel movement.  Symptoms of a ruptured ectopic pregnancy and internal bleeding may include:  Sudden, severe pain in the abdomen and pelvis.  Dizziness, weakness, light-headedness, or fainting.  Pain in the shoulder or neck area.  How is this diagnosed? This condition is diagnosed by:  A pelvic exam to locate pain or a mass in the abdomen.  A pregnancy test. This blood test checks for the presence as well as the specific level of pregnancy hormone in the bloodstream.  Ultrasound. This is performed if a pregnancy test is positive. In this test, a probe is inserted into the vagina. The probe will detect a fetus, possibly in a location other than the uterus.  Taking a sample of uterus tissue (dilation and curettage, or D&C).  Surgery to perform a visual exam of the inside of the abdomen using a thin, lighted tube that has a tiny camera on the end (laparoscope).  Culdocentesis. This procedure involves inserting a needle at the top   of the vagina, behind the uterus. If blood is present in this area, it may indicate that a fallopian tube is torn.  How is this treated? This condition is treated with medicine or surgery. Medicine  An injection of a medicine (methotrexate) may be given to cause the pregnancy tissue  to be absorbed. This medicine may save your fallopian tube. It may be given if: ? The diagnosis is made early, with no signs of active bleeding. ? The fallopian tube has not ruptured. ? You are considered to be a good candidate for the medicine. Usually, pregnancy hormone blood levels are checked after methotrexate treatment. This is to be sure that the medicine is effective. It may take 4-6 weeks for the pregnancy to be absorbed. Most pregnancies will be absorbed by 3 weeks. Surgery  A laparoscope may be used to remove the pregnancy tissue.  If severe internal bleeding occurs, a larger cut (incision) may be made in the lower abdomen (laparotomy) to remove the fetus and placenta. This is done to stop the bleeding.  Part or all of the fallopian tube may be removed (salpingectomy) along with the fetus and placenta. The fallopian tube may also be repaired during the surgery.  In very rare circumstances, removal of the uterus (hysterectomy) may be required.  After surgery, pregnancy hormone testing may be done to be sure that there is no pregnancy tissue left. Whether your treatment is medicine or surgery, you may receive a Rho (D) immune globulin shot to prevent problems with any future pregnancy. This shot may be given if:  You are Rh-negative and the baby's father is Rh-positive.  You are Rh-negative and you do not know the Rh type of the baby's father.  Follow these instructions at home:  Rest and limit your activity after the procedure for as long as told by your health care provider.  Until your health care provider says that it is safe: ? Do not lift anything that is heavier than 10 lb (4.5 kg), or the limit that your health care provider tells you. ? Avoid physical exercise and any movement that requires effort (is strenuous).  To help prevent constipation: ? Eat a healthy diet that includes fruits, vegetables, and whole grains. ? Drink 6-8 glasses of water per day. Get help  right away if:  You develop worsening pain that is not relieved by medicine.  You have: ? A fever or chills. ? Vaginal bleeding. ? Redness and swelling at the incision site. ? Nausea and vomiting.  You feel dizzy or weak.  You feel light-headed or you faint. This information is not intended to replace advice given to you by your health care provider. Make sure you discuss any questions you have with your health care provider. Document Released: 02/15/2004 Document Revised: 09/06/2015 Document Reviewed: 08/09/2015 Elsevier Interactive Patient Education  2018 [employer]  

## 2016-08-19 NOTE — MAU Provider Note (Signed)
History     CSN: [id number]  Arrival date and time: 08/19/16 1218   First Provider Initiated Contact with Patient 08/19/16 1407      Chief Complaint  Patient presents with  . Follow-up   [name] is a 26 y.o. [id number] at 2752w4d who presents today for FU HCG. She denies any abdominal pain or vaginal bleeding today.    Pelvic Pain  The patient's pertinent negatives include no pelvic pain or vaginal discharge. This is a new problem. The current episode started 1 to 4 weeks ago. The problem occurs intermittently. The problem has been resolved. The patient is experiencing no pain. She is pregnant. Pertinent negatives include no chills, fever, nausea or vomiting. The vaginal discharge was normal. There has been no bleeding. Nothing aggravates the symptoms. She has tried nothing for the symptoms. Menstrual history: LMP 07/11/16    Past Medical History:  Diagnosis Date  . Asthma   . Chlamydia 2011  . Gonorrhea 2011  . 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 Vaginal Pap smear, abnormal     Past Surgical History:  Procedure Laterality Date  . KNEE SURGERY  2007    Family History  Problem Relation Age of Onset  . Diabetes Maternal Grandmother   . Hypertension Maternal Grandmother   . Anesthesia problems Neg Hx     Social History  Substance Use Topics  . Smoking status: Former Smoker    Packs/day: 0.25    Years: 5.00    Types: Cigarettes    Quit date: 10/17/2014  . Smokeless tobacco: Never Used  . Alcohol use No     Comment: Quit: 03/17/12    Allergies: No Known Allergies  Prescriptions Prior to Admission  Medication Sig Dispense Refill Last Dose  . fluticasone (FLONASE) 50 MCG/ACT nasal spray Place 1 spray into both nostrils daily. 16 g 2 07/19/2015 at Unknown time  . Prenatal MV-Min-Fe Cbn-FA-DHA (PRENATAL PLUS DHA) 7-0.4-100 MG TABS Take 1 tablet by mouth daily. 60 each 5 07/19/2015 at Unknown time    Review of Systems  Constitutional: Negative for chills and  fever.  Gastrointestinal: Negative for nausea and vomiting.  Genitourinary: Negative for pelvic pain, vaginal bleeding and vaginal discharge.   Physical Exam   Blood pressure (!) 144/74, pulse 79, temperature 98.6 F (37 C), temperature source Oral, resp. rate 18, weight (!) 331 lb 0.6 oz (150.2 kg), last menstrual period 07/11/2016, SpO2 99 %, unknown if currently breastfeeding.  Physical Exam  Nursing note and vitals reviewed. Constitutional: She is oriented to person, place, and time. She appears well-developed and well-nourished. No distress.  HENT:  Head: Normocephalic.  Cardiovascular: Normal rate.   Respiratory: Effort normal.  GI: Soft. There is no tenderness. There is no rebound.  Neurological: She is alert and oriented to person, place, and time.  Skin: Skin is warm and dry.  Psychiatric: She has a normal mood and affect.     Results for [name], [name] (MRN [id number]) as of 08/19/2016 14:06  Ref. Range 08/13/2016 17:06 08/13/2016 18:49 08/13/2016 19:59 08/16/2016 11:25 08/19/2016 12:47  HCG, Beta Chain, Quant, S Latest Ref Range: <5 mIU/mL 530 (H)   1,832 (H) 4,628 (H)   MAU Course  Procedures  MDM   Assessment and Plan   1. Pregnancy, location unknown   HCG is rising as expected D/W the patient that I would like to repeat US today. She reports that she does not have time to stay. Will order outpatient US. Ectopic  and return precautions discussed with the patient.     DC home Comfort measures reviewed  1st Trimester precautions  Bleeding precautions Ectopic precautions RX: none  Return to MAU as needed FU with OB as planned  Follow-up Information    [facility] Follow up.   Specialty:  Radiology Why:  They will call you with an appointment  Contact information: [address] [id number] [place] [id number] [phone]           Thressa ShellerHeather Kerline Trahan 08/19/2016, 2:54 PM

## 2016-08-19 NOTE — MAU Note (Signed)
Patient here for follow up blood work Unable to come yesterday due to family emergency  Denies any pain or bleeding.

## 2016-09-02 ENCOUNTER — Encounter (HOSPITAL_COMMUNITY): Payer: Self-pay

## 2016-09-02 ENCOUNTER — Inpatient Hospital Stay (HOSPITAL_COMMUNITY): Payer: Self-pay

## 2016-09-02 ENCOUNTER — Inpatient Hospital Stay (HOSPITAL_COMMUNITY)
Admission: AD | Admit: 2016-09-02 | Discharge: 2016-09-02 | Disposition: A | Discharge status: Home / Self Care | Payer: Self-pay | Source: Ambulatory Visit | Attending: Obstetrics & Gynecology | Admitting: Obstetrics & Gynecology

## 2016-09-02 DIAGNOSIS — R102 Pelvic and perineal pain: Secondary | ICD-10-CM | POA: Insufficient documentation

## 2016-09-02 DIAGNOSIS — Z87891 Personal history of nicotine dependence: Secondary | ICD-10-CM | POA: Insufficient documentation

## 2016-09-02 DIAGNOSIS — O3680X0 Pregnancy with inconclusive fetal viability, not applicable or unspecified: Secondary | ICD-10-CM

## 2016-09-02 DIAGNOSIS — Z3490 Encounter for supervision of normal pregnancy, unspecified, unspecified trimester: Secondary | ICD-10-CM

## 2016-09-02 DIAGNOSIS — Z3A01 Less than 8 weeks gestation of pregnancy: Secondary | ICD-10-CM | POA: Insufficient documentation

## 2016-09-02 DIAGNOSIS — R109 Unspecified abdominal pain: Secondary | ICD-10-CM

## 2016-09-02 DIAGNOSIS — O26891 Other specified pregnancy related conditions, first trimester: Secondary | ICD-10-CM | POA: Insufficient documentation

## 2016-09-02 DIAGNOSIS — O9989 Other specified diseases and conditions complicating pregnancy, childbirth and the puerperium: Secondary | ICD-10-CM

## 2016-09-02 LAB — URINALYSIS, ROUTINE W REFLEX MICROSCOPIC
Bilirubin Urine: NEGATIVE
GLUCOSE, UA: NEGATIVE mg/dL
KETONES UR: 20 mg/dL — AB
Leukocytes, UA: NEGATIVE
Nitrite: NEGATIVE
PROTEIN: 30 mg/dL — AB
Specific Gravity, Urine: 1.018 (ref 1.005–1.030)
pH: 6 (ref 5.0–8.0)

## 2016-09-02 NOTE — MAU Note (Addendum)
G5P3 @ 7.[redacted] wksga. Present to triage for emesis and lower pressure/pain lower abdomen. States "feels like to to go to the bathroom but can't". Last BM this morning. Denies bleeding or vaginal discharge.   1747: U/S paged.   1757: pt to U/S via ambulatory walked with U/S   1830: back from U/S   1910: provider notified. Report status of pt given. Orders received to discharge.

## 2016-09-02 NOTE — Discharge Instructions (Signed)
Prenatal Care Providers °[facility]    [facility] ° Phone- [phone]     Phone: [phone] °         °[facility]                      [facility] ° @[place]     Phone: [phone] ° Phone: [phone] °         [facility] Center °[facility]     Phone: [phone] ° Phone: [phone]   °        [facility] °[facility] [place]                hone: [phone] ° Phone: [phone] °        [facility] °Dr. Bernard Marshall      Phone: [phone] ° Phone: [phone] °         [facility] °[facility]                Phone: [phone] ° [facility]  ° Phone:[phone]    [facility] ([place]) °         Phone: [phone] °[facility] °  Phone: [phone] °Safe Medications in Pregnancy  ° °Acne: °Benzoyl Peroxide °Salicylic Acid ° °Backache/Headache: °Tylenol: 2 regular strength every 4 hours OR °             2 Extra strength every 6 hours ° °Colds/Coughs/Allergies: °Benadryl (alcohol free) 25 mg every 6 hours as needed °Breath right strips °Claritin °Cepacol throat lozenges °Chloraseptic throat spray °Cold-Eeze- up to three times per day °Cough drops, alcohol free °Flonase (by prescription only) °Guaifenesin °Mucinex °Robitussin DM (plain only, alcohol free) °Saline nasal spray/drops °Sudafed (pseudoephedrine) & Actifed ** use only after [redacted] weeks gestation and if you do not have high blood pressure °Tylenol °Vicks Vaporub °Zinc lozenges °Zyrtec  ° °Constipation: °Colace °Ducolax suppositories °Fleet enema °Glycerin suppositories °Metamucil °Milk of magnesia °Miralax °Senokot °Smooth move tea ° °Diarrhea: °Kaopectate °Imodium A-D ° °*NO pepto Bismol ° °Hemorrhoids: °Anusol °Anusol HC °Preparation H °Tucks ° °Indigestion: °Tums °Maalox °Mylanta °Zantac  °Pepcid ° °Insomnia: °Benadryl (alcohol free) 25mg every 6 hours as needed °Tylenol  PM °Unisom, no Gelcaps ° °Leg Cramps: °Tums °MagGel ° °Nausea/Vomiting:  °Bonine °Dramamine °Emetrol °Ginger extract °Sea bands °Meclizine  °Nausea medication to take during pregnancy:  °Unisom (doxylamine succinate 25 mg tablets) Take one tablet daily at bedtime. If symptoms are not adequately controlled, the dose can be increased to a maximum recommended dose of two tablets daily (1/2 tablet in the morning, 1/2 tablet mid-afternoon and one at bedtime). °Vitamin B6 100mg tablets. Take one tablet twice a day (up to 200 mg per day). ° °Skin Rashes: °Aveeno products °Benadryl cream or 25mg every 6 hours as needed °Calamine Lotion °1% cortisone cream ° °Yeast infection: °Gyne-lotrimin 7 °Monistat 7 ° ° °**If taking multiple medications, please check labels to avoid duplicating the same active ingredients °**take medication as directed on the label °** Do not exceed 4000 mg of tylenol in 24 hours °**Do not take medications that contain aspirin or ibuprofen ° ° ° ° °First Trimester of Pregnancy °The first trimester of pregnancy is from week 1 until the end of week 13 (months 1 through 3). A week after a sperm fertilizes an egg, the egg will implant on the wall of the uterus. This   embryo will begin to develop into a baby. Genes from you and your partner will form the baby. The female genes will determine whether the baby will be a boy or a girl. At 6-8 weeks, the eyes and face will be formed, and the heartbeat can be seen on ultrasound. At the end of 12 weeks, all the baby's organs will be formed. °Now that you are pregnant, you will want to do everything you can to have a healthy baby. Two of the most important things are to get good prenatal care and to follow your health care provider's instructions. Prenatal care is all the medical care you receive before the baby's birth. This care will help prevent, find, and treat any problems during the pregnancy and childbirth. °Body changes during your first trimester °Your body  goes through many changes during pregnancy. The changes vary from woman to woman. °· You may gain or lose a couple of pounds at first. °· You may feel sick to your stomach (nauseous) and you may throw up (vomit). If the vomiting is uncontrollable, call your health care provider. °· You may tire easily. °· You may develop headaches that can be relieved by medicines. All medicines should be approved by your health care provider. °· You may urinate more often. Painful urination may mean you have a bladder infection. °· You may develop heartburn as a result of your pregnancy. °· You may develop constipation because certain hormones are causing the muscles that push stool through your intestines to slow down. °· You may develop hemorrhoids or swollen veins (varicose veins). °· Your breasts may begin to grow larger and become tender. Your nipples may stick out more, and the tissue that surrounds them (areola) may become darker. °· Your gums may bleed and may be sensitive to brushing and flossing. °· Dark spots or blotches (chloasma, mask of pregnancy) may develop on your face. This will likely fade after the baby is born. °· Your menstrual periods will stop. °· You may have a loss of appetite. °· You may develop cravings for certain kinds of food. °· You may have changes in your emotions from day to day, such as being excited to be pregnant or being concerned that something may go wrong with the pregnancy and baby. °· You may have more vivid and strange dreams. °· You may have changes in your hair. These can include thickening of your hair, rapid growth, and changes in texture. Some women also have hair loss during or after pregnancy, or hair that feels dry or thin. Your hair will most likely return to normal after your baby is born. ° °What to expect at prenatal visits °During a routine prenatal visit: °· You will be weighed to make sure you and the baby are growing normally. °· Your blood pressure will be taken. °· Your  abdomen will be measured to track your baby's growth. °· The fetal heartbeat will be listened to between weeks 10 and 14 of your pregnancy. °· Test results from any previous visits will be discussed. ° °Your health care provider may ask you: °· How you are feeling. °· If you are feeling the baby move. °· If you have had any abnormal symptoms, such as leaking fluid, bleeding, severe headaches, or abdominal cramping. °· If you are using any tobacco products, including cigarettes, chewing tobacco, and electronic cigarettes. °· If you have any questions. ° °Other tests that may be performed during your first trimester include: °· Blood tests to find   your blood type and to check for the presence of any previous infections. The tests will also be used to check for low iron levels (anemia) and protein on red blood cells (Rh antibodies). Depending on your risk factors, or if you previously had diabetes during pregnancy, you may have tests to check for high blood sugar that affects pregnant women (gestational diabetes). °· Urine tests to check for infections, diabetes, or protein in the urine. °· An ultrasound to confirm the proper growth and development of the baby. °· Fetal screens for spinal cord problems (spina bifida) and Down syndrome. °· HIV (human immunodeficiency virus) testing. Routine prenatal testing includes screening for HIV, unless you choose not to have this test. °· You may need other tests to make sure you and the baby are doing well. ° °Follow these instructions at home: °Medicines °· Follow your health care provider's instructions regarding medicine use. Specific medicines may be either safe or unsafe to take during pregnancy. °· Take a prenatal vitamin that contains at least 600 micrograms (mcg) of folic acid. °· If you develop constipation, try taking a stool softener if your health care provider approves. °Eating and drinking °· Eat a balanced diet that includes fresh fruits and vegetables, whole  grains, good sources of protein such as meat, eggs, or tofu, and low-fat dairy. Your health care provider will help you determine the amount of weight gain that is right for you. °· Avoid raw meat and uncooked cheese. These carry germs that can cause birth defects in the baby. °· Eating four or five small meals rather than three large meals a day may help relieve nausea and vomiting. If you start to feel nauseous, eating a few soda crackers can be helpful. Drinking liquids between meals, instead of during meals, also seems to help ease nausea and vomiting. °· Limit foods that are high in fat and processed sugars, such as fried and sweet foods. °· To prevent constipation: °? Eat foods that are high in fiber, such as fresh fruits and vegetables, whole grains, and beans. °? Drink enough fluid to keep your urine clear or pale yellow. °Activity °· Exercise only as directed by your health care provider. Most women can continue their usual exercise routine during pregnancy. Try to exercise for 30 minutes at least 5 days a week. Exercising will help you: °? Control your weight. °? Stay in shape. °? Be prepared for labor and delivery. °· Experiencing pain or cramping in the lower abdomen or lower back is a good sign that you should stop exercising. Check with your health care provider before continuing with normal exercises. °· Try to avoid standing for long periods of time. Move your legs often if you must stand in one place for a long time. °· Avoid heavy lifting. °· Wear low-heeled shoes and practice good posture. °· You may continue to have sex unless your health care provider tells you not to. °Relieving pain and discomfort °· Wear a good support bra to relieve breast tenderness. °· Take warm sitz baths to soothe any pain or discomfort caused by hemorrhoids. Use hemorrhoid cream if your health care provider approves. °· Rest with your legs elevated if you have leg cramps or low back pain. °· If you develop varicose  veins in your legs, wear support hose. Elevate your feet for 15 minutes, 3-4 times a day. Limit salt in your diet. °Prenatal care °· Schedule your prenatal visits by the twelfth week of pregnancy. They are usually scheduled monthly   at first, then more often in the last 2 months before delivery. °· Write down your questions. Take them to your prenatal visits. °· Keep all your prenatal visits as told by your health care provider. This is important. °Safety °· Wear your seat belt at all times when driving. °· Make a list of emergency phone numbers, including numbers for family, friends, the hospital, and police and fire departments. °General instructions °· Ask your health care provider for a referral to a local prenatal education class. Begin classes no later than the beginning of month 6 of your pregnancy. °· Ask for help if you have counseling or nutritional needs during pregnancy. Your health care provider can offer advice or refer you to specialists for help with various needs. °· Do not use hot tubs, steam rooms, or saunas. °· Do not douche or use tampons or scented sanitary pads. °· Do not cross your legs for long periods of time. °· Avoid cat litter boxes and soil used by cats. These carry germs that can cause birth defects in the baby and possibly loss of the fetus by miscarriage or stillbirth. °· Avoid all smoking, herbs, alcohol, and medicines not prescribed by your health care provider. Chemicals in these products affect the formation and growth of the baby. °· Do not use any products that contain nicotine or tobacco, such as cigarettes and e-cigarettes. If you need help quitting, ask your health care provider. You may receive counseling support and other resources to help you quit. °· Schedule a dentist appointment. At home, brush your teeth with a soft toothbrush and be gentle when you floss. °Contact a health care provider if: °· You have dizziness. °· You have mild pelvic cramps, pelvic pressure, or  nagging pain in the abdominal area. °· You have persistent nausea, vomiting, or diarrhea. °· You have a bad smelling vaginal discharge. °· You have pain when you urinate. °· You notice increased swelling in your face, hands, legs, or ankles. °· You are exposed to fifth disease or chickenpox. °· You are exposed to German measles (rubella) and have never had it. °Get help right away if: °· You have a fever. °· You are leaking fluid from your vagina. °· You have spotting or bleeding from your vagina. °· You have severe abdominal cramping or pain. °· You have rapid weight gain or loss. °· You vomit blood or material that looks like coffee grounds. °· You develop a severe headache. °· You have shortness of breath. °· You have any kind of trauma, such as from a fall or a car accident. °Summary °· The first trimester of pregnancy is from week 1 until the end of week 13 (months 1 through 3). °· Your body goes through many changes during pregnancy. The changes vary from woman to woman. °· You will have routine prenatal visits. During those visits, your health care provider will examine you, discuss any test results you may have, and talk with you about how you are feeling. °This information is not intended to replace advice given to you by your health care provider. Make sure you discuss any questions you have with your health care provider. °Document Released: 01/01/2001 Document Revised: 12/20/2015 Document Reviewed: 12/20/2015 °Elsevier Interactive Patient Education © 2017 [employer] ° °

## 2016-09-02 NOTE — MAU Provider Note (Signed)
History     CSN: [id number]  Arrival date and time: 09/02/16 1521   First Provider Initiated Contact with Patient 09/02/16 1714      Chief Complaint  Patient presents with  . Pelvic Pain  . Emesis    breakfast eaten at 1045 and threw up at 1330.    HPI- Patient is a 26 year old G5P3013 at 7+4 presenting for a three day history of low pelvic pain. Pain has been constant for three days now, keeping her from going to work. It is located in her central pelvis. She describes the pain as stabbing, non radiating, constant and severe. It is worse with movement and better with rest. She denies any vaginal bleeding, or discharge. She denies fever or chills. She denies dysuria but has had increased urge to urinate without being able to void. She affirms mild nausea with the pain but no diarrhea. She feels she is keeping down most of her food and is staying well hydrated. She was seen for similar pains in MAU, and was discharged wiht a planned f/u US that was never scheduled.   OB History    Gravida Para Term Preterm AB Living   5 3 3  0 1 3   SAB TAB Ectopic Multiple Live Births   1 0 0 0 3      Past Medical History:  Diagnosis Date  . Asthma   . Chlamydia 2011  . Gonorrhea 2011  . 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 Vaginal Pap smear, abnormal     Past Surgical History:  Procedure Laterality Date  . KNEE SURGERY  2007    Family History  Problem Relation Age of Onset  . Diabetes Maternal Grandmother   . Hypertension Maternal Grandmother   . Anesthesia problems Neg Hx     Social History  Substance Use Topics  . Smoking status: Former Smoker    Packs/day: 0.25    Years: 5.00    Types: Cigarettes    Quit date: 10/17/2014  . Smokeless tobacco: Never Used  . Alcohol use No     Comment: Quit: 03/17/12    Allergies: No Known Allergies  Prescriptions Prior to Admission  Medication Sig Dispense Refill Last Dose  . Prenatal MV-Min-Fe Cbn-FA-DHA (PRENATAL PLUS DHA) 7-0.4-100  MG TABS Take 1 tablet by mouth daily. 60 each 5 09/02/2016 at Unknown time    Review of Systems Physical Exam   Blood pressure (!) 122/58, pulse 74, temperature 98.7 F (37.1 C), temperature source Oral, resp. rate 18, height 5\' 7"  (1.702 m), weight (!) 326 lb (147.9 kg), last menstrual period 07/11/2016, unknown if currently breastfeeding.  Physical Exam  Constitutional: She is oriented to person, place, and time. She appears well-developed.  HENT:  Head: Normocephalic.  Eyes: Conjunctivae are normal. Right eye exhibits no discharge. Left eye exhibits no discharge.  Neck: Normal range of motion.  Cardiovascular: Normal rate.   Respiratory: Effort normal.  GI: Soft. Bowel sounds are normal. There is tenderness. There is no rebound and no guarding.    Musculoskeletal: Normal range of motion.  Neurological: She is alert and oriented to person, place, and time.  Skin: Skin is warm.  Psychiatric: She has a normal mood and affect.    MAU Course  Procedures  MDM TVUS  Assessment and Plan  TVUS confirmed IUP CWD. Pain improving. Urinalysis WNL. Plan to discharge and follow at clinic to establish care  Corey P [name] 09/02/2016, 6:04 PM  OB FELLOW MAU DISCHARGE ATTESTATION  I have seen and examined this patient; I agree with above documentation in the resident's note.    Frederik PearJulie P Jakita Dutkiewicz, MD OB Fellow

## 2016-09-02 NOTE — MAU Note (Signed)
Pt C/O pelvic cramping & has been vomiting for the last 3 days, denies diarrhea.  Also having HA's.  Denies vaginal bleeding.  Was to have an outpatient U/S, but states she was never called with appt.

## 2016-10-09 ENCOUNTER — Encounter: Payer: Self-pay | Admitting: Certified Nurse Midwife

## 2016-11-02 ENCOUNTER — Encounter (HOSPITAL_COMMUNITY): Payer: Self-pay

## 2016-11-02 ENCOUNTER — Inpatient Hospital Stay (HOSPITAL_COMMUNITY)
Admission: AD | Admit: 2016-11-02 | Discharge: 2016-11-02 | Disposition: A | Discharge status: Home / Self Care | Payer: Medicaid Other | Source: Ambulatory Visit | Attending: Obstetrics and Gynecology | Admitting: Obstetrics and Gynecology

## 2016-11-02 DIAGNOSIS — O99512 Diseases of the respiratory system complicating pregnancy, second trimester: Secondary | ICD-10-CM | POA: Diagnosis not present

## 2016-11-02 DIAGNOSIS — N760 Acute vaginitis: Secondary | ICD-10-CM | POA: Diagnosis not present

## 2016-11-02 DIAGNOSIS — R109 Unspecified abdominal pain: Secondary | ICD-10-CM

## 2016-11-02 DIAGNOSIS — J45909 Unspecified asthma, uncomplicated: Secondary | ICD-10-CM | POA: Diagnosis not present

## 2016-11-02 DIAGNOSIS — O26892 Other specified pregnancy related conditions, second trimester: Secondary | ICD-10-CM | POA: Insufficient documentation

## 2016-11-02 DIAGNOSIS — Z87891 Personal history of nicotine dependence: Secondary | ICD-10-CM | POA: Insufficient documentation

## 2016-11-02 DIAGNOSIS — Z3A16 16 weeks gestation of pregnancy: Secondary | ICD-10-CM | POA: Insufficient documentation

## 2016-11-02 DIAGNOSIS — B9689 Other specified bacterial agents as the cause of diseases classified elsewhere: Secondary | ICD-10-CM

## 2016-11-02 DIAGNOSIS — N898 Other specified noninflammatory disorders of vagina: Secondary | ICD-10-CM | POA: Insufficient documentation

## 2016-11-02 LAB — URINALYSIS, ROUTINE W REFLEX MICROSCOPIC
BACTERIA UA: NONE SEEN
BILIRUBIN URINE: NEGATIVE
Glucose, UA: NEGATIVE mg/dL
KETONES UR: NEGATIVE mg/dL
LEUKOCYTES UA: NEGATIVE
Nitrite: NEGATIVE
Protein, ur: 30 mg/dL — AB
Specific Gravity, Urine: 1.028 (ref 1.005–1.030)
pH: 6 (ref 5.0–8.0)

## 2016-11-02 LAB — WET PREP, GENITAL
Clue Cells Wet Prep HPF POC: NONE SEEN
Sperm: NONE SEEN
TRICH WET PREP: NONE SEEN
YEAST WET PREP: NONE SEEN

## 2016-11-02 MED ORDER — METRONIDAZOLE 500 MG PO TABS: 500.0000 mg | ORAL_TABLET | Freq: Two times a day (BID) | ORAL | 0 refills | Status: DC

## 2016-11-02 NOTE — MAU Note (Signed)
Pt reports she has been having a sharp pain on and off for several weeks. Hurts more when seh lays down. Has not taken any pain medication afraid to do so.

## 2016-11-02 NOTE — MAU Provider Note (Signed)
History   [id number] @ 16.2 wks in with c/o abd pain that has been going on the whole preg. Also foul smelling discharge that's been going on for a week. Denies vag bleeding or ROM  CSN: [id number]  Arrival date & time 11/02/16  1142   None     Chief Complaint  Patient presents with  . Abdominal Pain    HPI  Past Medical History:  Diagnosis Date  . Asthma   . Chlamydia 2011  . Gonorrhea 2011  . 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 Vaginal Pap smear, abnormal     Past Surgical History:  Procedure Laterality Date  . KNEE SURGERY  2007    Family History  Problem Relation Age of Onset  . Diabetes Maternal Grandmother   . Hypertension Maternal Grandmother   . Anesthesia problems Neg Hx     Social History  Substance Use Topics  . Smoking status: Former Smoker    Packs/day: 0.25    Years: 5.00    Types: Cigarettes    Quit date: 10/17/2014  . Smokeless tobacco: Never Used  . Alcohol use No     Comment: Quit: 03/17/12    OB History    Gravida Para Term Preterm AB Living   0 1 3   SAB TAB Ectopic Multiple Live Births   1 0 0 0 3      Review of Systems  Constitutional: Negative.   HENT: Negative.   Eyes: Negative.   Respiratory: Negative.   Cardiovascular: Negative.   Gastrointestinal: Positive for abdominal pain.  Endocrine: Negative.   Genitourinary: Positive for vaginal discharge.  Musculoskeletal: Negative.   Skin: Negative.   Allergic/Immunologic: Negative.   Neurological: Negative.   Hematological: Negative.   Psychiatric/Behavioral: Negative.     Allergies  Patient has no known allergies.  Home Medications    BP 125/75   Pulse 78   Temp 98.2 F (36.8 C)   Resp 18   Ht  (1.702 m)   Wt (!) 318 lb (144.2 kg)   LMP 07/11/2016   BMI 49.81 kg/m   Physical Exam  Constitutional: She is oriented to person, place, and time. She appears well-developed and well-nourished.  HENT:  Head: Normocephalic.  Neck: Normal range of motion.   Cardiovascular: Normal rate, regular rhythm, normal heart sounds and intact distal pulses.   Pulmonary/Chest: Effort normal and breath sounds normal.  Abdominal: Soft. Bowel sounds are normal.  Genitourinary: Vaginal discharge found.  Musculoskeletal: Normal range of motion.  Neurological: She is alert and oriented to person, place, and time. She has normal reflexes.  Skin: Skin is warm and dry.  Psychiatric: She has a normal mood and affect. Her behavior is normal. Judgment and thought content normal.    MAU Course  Procedures (including critical care time)  Labs Reviewed  WET PREP, GENITAL  URINALYSIS, ROUTINE W REFLEX MICROSCOPIC  GC/CHLAMYDIA PROBE AMP ([place]) NOT AT [facility]   No results found.   1. Abdominal pain in pregnancy, second trimester   2. Vaginal discharge in pregnancy in second trimester       MDM  VSS, WET prep , and cultures obtained. No vag bleeding noted with exam.  Strong ammine odor noted upon entry to room and with exam. Will treat for BV and d/c home

## 2016-11-02 NOTE — Discharge Instructions (Signed)

## 2016-11-04 LAB — GC/CHLAMYDIA PROBE AMP (~~LOC~~) NOT AT ARMC
CHLAMYDIA, DNA PROBE: NEGATIVE
NEISSERIA GONORRHEA: NEGATIVE

## 2016-11-19 ENCOUNTER — Ambulatory Visit (INDEPENDENT_AMBULATORY_CARE_PROVIDER_SITE_OTHER): Payer: Medicaid Other | Admitting: Advanced Practice Midwife

## 2016-11-19 ENCOUNTER — Encounter: Payer: Self-pay | Admitting: Advanced Practice Midwife

## 2016-11-19 DIAGNOSIS — Z3480 Encounter for supervision of other normal pregnancy, unspecified trimester: Secondary | ICD-10-CM | POA: Insufficient documentation

## 2016-11-19 DIAGNOSIS — Z3483 Encounter for supervision of other normal pregnancy, third trimester: Secondary | ICD-10-CM

## 2016-11-19 MED ORDER — ASPIRIN EC 81 MG PO TBEC: 81.0000 mg | DELAYED_RELEASE_TABLET | Freq: Every day | ORAL | 11 refills | Status: DC

## 2016-11-19 NOTE — Progress Notes (Signed)
  Subjective:    [name] is being seen today for her first obstetrical visit.  This is a planned pregnancy. She is at 6847w5d gestation. Her obstetrical history is significant for obesity. Relationship with FOB: significant other, living together. Patient does intend to breast feed. Pregnancy history fully reviewed.  Patient reports no complaints.  Review of Systems:   Review of Systems  Constitutional: Negative for chills and fever.  Gastrointestinal: Negative for nausea and vomiting.  Genitourinary: Negative for pelvic pain, vaginal bleeding and vaginal discharge.  Musculoskeletal: Positive for back pain.    Objective:     BP (!) 119/48   Pulse 80   Wt (!) 317 lb (143.8 kg)   LMP 07/11/2016   BMI 49.65 kg/m  Physical Exam  Nursing note and vitals reviewed. Constitutional: She is oriented to person, place, and time. She appears well-developed and well-nourished. No distress.  HENT:  Head: Normocephalic.  Cardiovascular: Normal rate.   Respiratory: Effort normal.  GI: Soft. There is no tenderness. There is no rebound.  Neurological: She is alert and oriented to person, place, and time.  Skin: Skin is warm and dry.  Psychiatric: She has a normal mood and affect.    Maternal Exam:  Abdomen: Fundal height is 18 weeks .       Fetal Exam Fetal Monitor Review: Mode: hand-held doppler probe.   Baseline rate: 145.       Assessment:    Pregnancy: [id number] Patient Active Problem List   Diagnosis Date Noted  . Supervision of other normal pregnancy, antepartum 11/19/2016  . 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 Morbid obesity with BMI of 45.0-49.9, adult (HCC) 03/25/2011       Plan:    Initial labs drawn. + 1st degree relative with DM (father), early 1 hour GTT today  Prenatal vitamins. Problem list reviewed and updated. AFP3 discussed: requested. Role of ultrasound in pregnancy discussed; fetal survey: requested. Amniocentesis discussed: not  indicated. Start ASA, up to date criteria: obesity and african american  Follow up in 4 weeks. 50% of 45 min visit spent on counseling and coordination of care.      Thressa ShellerHeather Christabel Camire 11/19/2016

## 2016-11-19 NOTE — Progress Notes (Signed)
Pt stated having both side of the hip especially at night.

## 2016-11-21 LAB — HEMOGLOBINOPATHY EVALUATION
HEMOGLOBIN F QUANTITATION: 0 % (ref 0.0–2.0)
HGB A: 97.4 % (ref 96.4–98.8)
HGB C: 0 %
HGB S: 0 %
HGB VARIANT: 0 %
Hemoglobin A2 Quantitation: 2.6 % (ref 1.8–3.2)

## 2016-11-21 LAB — OBSTETRIC PANEL, INCLUDING HIV
ANTIBODY SCREEN: NEGATIVE
BASOS ABS: 0 10*3/uL (ref 0.0–0.2)
BASOS: 0 %
EOS (ABSOLUTE): 0.2 10*3/uL (ref 0.0–0.4)
Eos: 2 %
HEMOGLOBIN: 11.8 g/dL (ref 11.1–15.9)
HEP B S AG: NEGATIVE
HIV SCREEN 4TH GENERATION: NONREACTIVE
Hematocrit: 35.3 % (ref 34.0–46.6)
IMMATURE GRANS (ABS): 0 10*3/uL (ref 0.0–0.1)
Immature Granulocytes: 0 %
LYMPHS: 25 %
Lymphocytes Absolute: 2.6 10*3/uL (ref 0.7–3.1)
MCH: 25.5 pg — ABNORMAL LOW (ref 26.6–33.0)
MCHC: 33.4 g/dL (ref 31.5–35.7)
MCV: 76 fL — ABNORMAL LOW (ref 79–97)
MONOS ABS: 0.5 10*3/uL (ref 0.1–0.9)
Monocytes: 5 %
NEUTROS ABS: 7.3 10*3/uL — AB (ref 1.4–7.0)
NEUTROS PCT: 68 %
Platelets: 320 10*3/uL (ref 150–379)
RBC: 4.62 x10E6/uL (ref 3.77–5.28)
RDW: 16.8 % — ABNORMAL HIGH (ref 12.3–15.4)
RPR: NONREACTIVE
Rh Factor: POSITIVE
Rubella Antibodies, IGG: 3.6 index (ref >0.99)
WBC: 10.6 10*3/uL (ref 3.4–10.8)

## 2016-11-21 LAB — AFP TETRA
DIA MOM VALUE: 0.89
DIA Value (EIA): 103.52 pg/mL
DSR (BY AGE) 1 IN: 998
DSR (SECOND TRIMESTER) 1 IN: 4234
Gestational Age: 18.7 WEEKS
MATERNAL AGE AT EDD: 25.7 a
MSAFP MOM: 1.17
MSAFP: 39.7 ng/mL
MSHCG Mom: 1.97
MSHCG: 32102 m[IU]/mL
OSB RISK: 10000
T18 (By Age): 1:3887 {titer}
TEST RESULTS AFP: NEGATIVE
WEIGHT: 317 [lb_av]
uE3 Mom: 0.72
uE3 Value: 0.88 ng/mL

## 2016-11-21 LAB — HEMOGLOBIN A1C
ESTIMATED AVERAGE GLUCOSE: 111 mg/dL
HEMOGLOBIN A1C: 5.5 % (ref 4.8–5.6)

## 2016-11-21 LAB — GLUCOSE TOLERANCE, 1 HOUR: Glucose, 1Hr PP: 88 mg/dL (ref 65–199)

## 2016-11-21 LAB — URINE CULTURE, OB REFLEX

## 2016-11-21 LAB — CULTURE, OB URINE

## 2016-11-29 ENCOUNTER — Other Ambulatory Visit: Payer: Self-pay | Admitting: Family Medicine

## 2016-11-29 DIAGNOSIS — Z3480 Encounter for supervision of other normal pregnancy, unspecified trimester: Secondary | ICD-10-CM

## 2016-12-17 ENCOUNTER — Ambulatory Visit (INDEPENDENT_AMBULATORY_CARE_PROVIDER_SITE_OTHER): Payer: Medicaid Other | Admitting: Medical

## 2016-12-17 ENCOUNTER — Encounter: Payer: Self-pay | Admitting: Medical

## 2016-12-17 VITALS — BP 136/72 | HR 97 | Wt 315.2 lb

## 2016-12-17 DIAGNOSIS — Z3482 Encounter for supervision of other normal pregnancy, second trimester: Secondary | ICD-10-CM

## 2016-12-17 DIAGNOSIS — Z3480 Encounter for supervision of other normal pregnancy, unspecified trimester: Secondary | ICD-10-CM

## 2016-12-17 NOTE — Progress Notes (Signed)
C/o vision problems-blurry sometimes. . States had

## 2016-12-17 NOTE — Progress Notes (Signed)
   PRENATAL VISIT NOTE  Subjective:  [name] is a 26 y.o. [id number] at 7258w5d being seen today for ongoing prenatal care.  She is currently monitored for the following issues for this low-risk pregnancy and has Morbid obesity with BMI of 45.0-49.9, adult (HCC); Herpes simplex type 2 infection; and Supervision of other normal pregnancy, antepartum on their problem list.  Patient reports nausea and vomiting.  Contractions: Not present. Vag. Bleeding: None.  Movement: Present. Denies leaking of fluid.   The following portions of the patient's history were reviewed and updated as appropriate: allergies, current medications, past family history, past medical history, past social history, past surgical history and problem list. Problem list updated.  Objective:   Vitals:   12/17/16 1236 12/17/16 1245  BP: (!) 142/72 136/72  Pulse: 97   Weight: (!) 315 lb 3.2 oz (143 kg)     Fetal Status: Fetal Heart Rate (bpm): 152 Fundal Height: 24 cm Movement: Present     General:  Alert, oriented and cooperative. Patient is in no acute distress.  Skin: Skin is warm and dry. No rash noted.   Cardiovascular: Normal heart rate noted  Respiratory: Normal respiratory effort, no problems with respiration noted  Abdomen: Soft, gravid, appropriate for gestational age.  Pain/Pressure: Present     Pelvic: Cervical exam deferred        Extremities: Normal range of motion.  Edema: Trace  Mental Status:  Normal mood and affect. Normal behavior. Normal judgment and thought content.   Assessment and Plan:  Pregnancy: [id number] at 8758w5d  1. Supervision of other normal pregnancy, antepartum - Diet for N/V discussed, continue Zofran PRN for N/V  - US MFM OB DETAIL +14 WK; scheduled for tomorrow  Preterm labor symptoms and general obstetric precautions including but not limited to vaginal bleeding, contractions, leaking of fluid and fetal movement were reviewed in detail with the patient. Please refer to After  Visit Summary for other counseling recommendations.  Return in about 4 weeks (around 01/14/2017) for LOB, 28 week labs (fasting).   Vonzella NippleJulie Wenzel, PA-C

## 2016-12-17 NOTE — Patient Instructions (Addendum)
Second Trimester of Pregnancy The second trimester is from week 13 through week 28, month 4 through 6. This is often the time in pregnancy that you feel your best. Often times, morning sickness has lessened or quit. You may have more energy, and you may get hungry more often. Your unborn baby (fetus) is growing rapidly. At the end of the sixth month, he or she is about 9 inches long and weighs about 1 pounds. You will likely feel the baby move (quickening) between 18 and 20 weeks of pregnancy. Follow these instructions at home:  Avoid all smoking, herbs, and alcohol. Avoid drugs not approved by your doctor.  Do not use any tobacco products, including cigarettes, chewing tobacco, and electronic cigarettes. If you need help quitting, ask your doctor. You may get counseling or other support to help you quit.  Only take medicine as told by your doctor. Some medicines are safe and some are not during pregnancy.  Exercise only as told by your doctor. Stop exercising if you start having cramps.  Eat regular, healthy meals.  Wear a good support bra if your breasts are tender.  Do not use hot tubs, steam rooms, or saunas.  Wear your seat belt when driving.  Avoid raw meat, uncooked cheese, and liter boxes and soil used by cats.  Take your prenatal vitamins.  Take 1500-2000 milligrams of calcium daily starting at the 20th week of pregnancy until you deliver your baby.  Try taking medicine that helps you poop (stool softener) as needed, and if your doctor approves. Eat more fiber by eating fresh fruit, vegetables, and whole grains. Drink enough fluids to keep your pee (urine) clear or pale yellow.  Take warm water baths (sitz baths) to soothe pain or discomfort caused by hemorrhoids. Use hemorrhoid cream if your doctor approves.  If you have puffy, bulging veins (varicose veins), wear support hose. Raise (elevate) your feet for 15 minutes, 3-4 times a day. Limit salt in your diet.  Avoid heavy  lifting, wear low heals, and sit up straight.  Rest with your legs raised if you have leg cramps or low back pain.  Visit your dentist if you have not gone during your pregnancy. Use a soft toothbrush to brush your teeth. Be gentle when you floss.  You can have sex (intercourse) unless your doctor tells you not to.  Go to your doctor visits. Get help if:  You feel dizzy.  You have mild cramps or pressure in your lower belly (abdomen).  You have a nagging pain in your belly area.  You continue to feel sick to your stomach (nauseous), throw up (vomit), or have watery poop (diarrhea).  You have bad smelling fluid coming from your vagina.  You have pain with peeing (urination). Get help right away if:  You have a fever.  You are leaking fluid from your vagina.  You have spotting or bleeding from your vagina.  You have severe belly cramping or pain.  You lose or gain weight rapidly.  You have trouble catching your breath and have chest pain.  You notice sudden or extreme puffiness (swelling) of your face, hands, ankles, feet, or legs.  You have not felt the baby move in over an hour.  You have severe headaches that do not go away with medicine.  You have vision changes. This information is not intended to replace advice given to you by your health care provider. Make sure you discuss any questions you have with your health care   provider. Document Released: 04/03/2009 Document Revised: 06/15/2015 Document Reviewed: 03/10/2012 Elsevier Interactive Patient Education  2017 Elsevier Inc.  Eating Plan for Nausea and Vomiting in Pregnancy  Hyperemesis gravidarum is a severe form of morning sickness. Because this condition causes severe nausea and vomiting, it can lead to dehydration, malnutrition, and weight loss. One way to lessen the symptoms of nausea and vomiting is to follow the eating plan for hyperemesis gravidarum. It is often used along with prescribed medicines to  control your symptoms. What can I do to relieve my symptoms? Listen to your body. Everyone is different and has different preferences. Find what works best for you. Take any of the following actions that are helpful to you:  Eat and drink slowly.  Eat 5-6 small meals daily instead of 3 large meals.  Eat crackers before you get out of bed in the morning.  Try having a snack in the middle of the night.  Starchy foods are usually tolerated well. Examples include cereal, toast, bread, potatoes, pasta, rice, and pretzels.  Ginger may help with nausea. Add  tsp ground ginger to hot tea or choose ginger tea.  Try drinking 100% fruit juice or an electrolyte drink. An electrolyte drink contains sodium, potassium, and chloride.  Continue to take your prenatal vitamins as told by your health care provider. If you are having trouble taking your prenatal vitamins, talk with your health care provider about different options.  Include at least 1 serving of protein with your meals and snacks. Protein options include meats or poultry, beans, nuts, eggs, and yogurt. Try eating a protein-rich snack before bed. Examples of these snacks include cheese and crackers or half of a peanut butter or [place] sandwich.  Consider eliminating foods that trigger your symptoms. These may include spicy foods, coffee, high-fat foods, very sweet foods, and acidic foods.  Try meals that have more protein combined with bland, salty, lower-fat, and dry foods, such as nuts, seeds, pretzels, crackers, and cereal.  Talk with your healthcare provider about starting a supplement of vitamin B6.  Have fluids that are cold, clear, and carbonated or sour. Examples include lemonade, ginger ale, lemon-lime soda, ice water, and sparkling water.  Try lemon or mint tea.  Try brushing your teeth or using a mouth rinse after meals.  What should I avoid to reduce my symptoms? Avoiding some of the following things may help reduce your  symptoms.  Foods with strong smells. Try eating meals in well-ventilated areas that are free of odors.  Drinking water or other beverages with meals. Try not to drink anything during the 30 minutes before and after your meals.  Drinking more than 1 cup of fluid at a time. Sometimes using a straw helps.  Fried or high-fat foods, such as butter and cream sauces.  Spicy foods.  Skipping meals as best as you can. Nausea can be more intense on an empty stomach. If you cannot tolerate food at that time, do not force it. Try sucking on ice chips or other frozen items, and make up for missed calories later.  Lying down within 2 hours after eating.  Environmental triggers. These may include smoky rooms, closed spaces, rooms with strong smells, warm or humid places, overly loud and noisy rooms, and rooms with motion or flickering lights.  Quick and sudden changes in your movement.  This information is not intended to replace advice given to you by your health care provider. Make sure you discuss any questions you have with your  health care provider. Document Released: 11/04/2006 Document Revised: 09/06/2015 Document Reviewed: 08/08/2015 Elsevier Interactive Patient Education  [employer]2018 [employer] Inc.

## 2016-12-18 ENCOUNTER — Ambulatory Visit (HOSPITAL_COMMUNITY)
Admission: RE | Admit: 2016-12-18 | Discharge: 2016-12-18 | Disposition: A | Discharge status: Home / Self Care | Payer: Medicaid Other | Source: Ambulatory Visit | Attending: Medical | Admitting: Medical

## 2016-12-18 DIAGNOSIS — Z3689 Encounter for other specified antenatal screening: Secondary | ICD-10-CM | POA: Diagnosis present

## 2016-12-18 DIAGNOSIS — O99212 Obesity complicating pregnancy, second trimester: Secondary | ICD-10-CM | POA: Insufficient documentation

## 2016-12-18 DIAGNOSIS — Z3A22 22 weeks gestation of pregnancy: Secondary | ICD-10-CM | POA: Insufficient documentation

## 2016-12-18 DIAGNOSIS — Z3480 Encounter for supervision of other normal pregnancy, unspecified trimester: Secondary | ICD-10-CM

## 2017-01-15 ENCOUNTER — Ambulatory Visit (INDEPENDENT_AMBULATORY_CARE_PROVIDER_SITE_OTHER): Payer: Medicaid Other

## 2017-01-15 VITALS — BP 120/75 | HR 86 | Wt 310.8 lb

## 2017-01-15 DIAGNOSIS — Z23 Encounter for immunization: Secondary | ICD-10-CM

## 2017-01-15 DIAGNOSIS — K219 Gastro-esophageal reflux disease without esophagitis: Secondary | ICD-10-CM

## 2017-01-15 DIAGNOSIS — Z3480 Encounter for supervision of other normal pregnancy, unspecified trimester: Secondary | ICD-10-CM

## 2017-01-15 DIAGNOSIS — Z3483 Encounter for supervision of other normal pregnancy, third trimester: Secondary | ICD-10-CM

## 2017-01-15 MED ORDER — FAMOTIDINE 20 MG PO TABS: 20.0000 mg | ORAL_TABLET | Freq: Two times a day (BID) | ORAL | 2 refills | Status: DC

## 2017-01-15 NOTE — Progress Notes (Signed)
28 week labs/tdap today 

## 2017-01-15 NOTE — Patient Instructions (Signed)
Glucose Tolerance Test During Pregnancy The glucose tolerance test (GTT) is a blood test used to determine if you have developed a type of diabetes during pregnancy (gestational diabetes). This is when your body does not properly process sugar (glucose) in the food you eat, resulting in high blood glucose levels. Typically, a GTT is done after you have had a 1-hour glucose test with results that indicate you possibly have gestational diabetes. It may also be done if:  You have a history of giving birth to very large babies or have experienced repeated fetal loss (stillbirth).  You have signs and symptoms of diabetes, such as: ? Changes in your vision. ? Tingling or numbness in your hands or feet. ? Changes in hunger, thirst, and urination not otherwise explained by your pregnancy.  The GTT lasts about 3 hours. You will be given a sugar-water solution to drink at the beginning of the test. You will have blood drawn before you drink the solution and then again 1, 2, and 3 hours after you drink it. You will not be allowed to eat or drink anything else during the test. You must remain at the testing location to make sure that your blood is drawn on time. You should also avoid exercising during the test, because exercise can alter test results. How do I prepare for this test? Eat normally for 3 days prior to the GTT test, including having plenty of carbohydrate-rich foods. Do not eat or drink anything except water during the final 12 hours before the test. In addition, your health care provider may ask you to stop taking certain medicines before the test. What do the results mean? It is your responsibility to obtain your test results. Ask the lab or department performing the test when and how you will get your results. Contact your health care provider to discuss any questions you have about your results. Range of Normal Values Ranges for normal values may vary among different labs and hospitals. You  should always check with your health care provider after having lab work or other tests done to discuss whether your values are considered within normal limits. Normal levels of blood glucose are as follows:  Fasting: less than 105 mg/dL.  1 hour after drinking the solution: less than 190 mg/dL.  2 hours after drinking the solution: less than 165 mg/dL.  3 hours after drinking the solution: less than 145 mg/dL.  Some substances can interfere with GTT results. These may include:  Blood pressure and heart failure medicines, including beta blockers, furosemide, and thiazides.  Anti-inflammatory medicines, including aspirin.  Nicotine.  Some psychiatric medicines.  Meaning of Results Outside Normal Value Ranges GTT test results that are above normal values may indicate a number of health problems, such as:  Gestational diabetes.  Acute stress response.  Cushing syndrome.  Tumors such as pheochromocytoma or glucagonoma.  Long-term kidney problems.  Pancreatitis.  Hyperthyroidism.  Current infection.  Discuss your test results with your health care provider. He or she will use the results to make a diagnosis and determine a treatment plan that is right for you. This information is not intended to replace advice given to you by your health care provider. Make sure you discuss any questions you have with your health care provider. Document Released: 07/09/2011 Document Revised: 06/15/2015 Document Reviewed: 05/14/2013  CIRCUMCISION  Circumcision is considered an elective/non-medically necessary procedure. There are many reasons parents decide to have their sons circumsized. During the first year of life circumcised males  have a reduced risk of urinary tract infections but after this year the rates between circumcised males and uncircumcised males are the same.  It is safe to have your son circumcised outside of the hospital and the places above perform them regularly.     Places to have your son circumcised:    [facility] [phone] $480 by 4 wks  [facility] [phone] $244 by 4 wks  [facility] [phone] $175 by 2 wks  [facility] [phone] $250 by 7 days [facility] [phone] $150 by 4 wks  These prices sometimes change but are roughly what you can expect to pay. Please call and confirm pricing.     Risk analystlsevier Interactive Patient Education  [employer]2018 [employer] Inc.

## 2017-01-15 NOTE — Progress Notes (Addendum)
   PRENATAL VISIT NOTE  Subjective:  [name] is a 26 y.o. [phone] at 4456w6d being seen today for ongoing prenatal care.  She is currently monitored for the following issues for this low-risk pregnancy and has Morbid obesity with BMI of 45.0-49.9, adult (HCC); Herpes simplex type 2 infection; and Supervision of other normal pregnancy, antepartum on their problem list.  Patient reports heartburn. She reports burning and a reflux sensation throughout the day and gets no relief from OTC treamtents.  Contractions: Not present. Vag. Bleeding: None.  Movement: Present. Denies leaking of fluid.   The following portions of the patient's history were reviewed and updated as appropriate: allergies, current medications, past family history, past medical history, past social history, past surgical history and problem list. Problem list updated.  Objective:   Vitals:   01/15/17 0803  BP: 120/75  Pulse: 86  Weight: (!) 310 lb 12.8 oz (141 kg)    Fetal Status: Fetal Heart Rate (bpm): 152 Fundal Height: 27 cm Movement: Present     General:  Alert, oriented and cooperative. Patient is in no acute distress.  Skin: Skin is warm and dry. No rash noted.   Cardiovascular: Normal heart rate noted  Respiratory: Normal respiratory effort, no problems with respiration noted  Abdomen: Soft, gravid, appropriate for gestational age.  Pain/Pressure: Present     Pelvic: Cervical exam deferred        Extremities: Normal range of motion.  Edema: None  Mental Status:  Normal mood and affect. Normal behavior. Normal judgment and thought content.   Assessment and Plan:  Pregnancy: [id number] at 3456w6d  1. Encounter for supervision of other normal pregnancy in third trimester -Patient planning tubal ligation for PP contraception. Counseled on risks and benefits of BTL and other contraceptive options. Patient desires BTL and signed papers today.  - Glucose Tolerance, 2 Hours w/1 Hour - CBC - HIV antibody (with  reflex) - RPR - US MFM OB FOLLOW UP; Future  2. Need for diphtheria-tetanus-pertussis (Tdap) vaccine - Tdap vaccine greater than or equal to 7yo IM  3. Gastroesophageal reflux disease, esophagitis presence not specified -Food choices and comfort measures discussed - famotidine (PEPCID) 20 MG tablet; Take 1 tablet (20 mg total) by mouth 2 (two) times daily.  Dispense: 60 tablet; Refill: 2  Preterm labor symptoms and general obstetric precautions including but not limited to vaginal bleeding, contractions, leaking of fluid and fetal movement were reviewed in detail with the patient. Please refer to After Visit Summary for other counseling recommendations.  Return in about 2 weeks (around 01/29/2017) for Return OB visit.  Rolm BookbinderCaroline M Allix Blomquist, CNM  01/15/17 3:05 PM

## 2017-01-16 LAB — GLUCOSE TOLERANCE, 2 HOURS W/ 1HR
GLUCOSE, 1 HOUR: 108 mg/dL (ref 65–179)
GLUCOSE, FASTING: 91 mg/dL (ref 65–91)
Glucose, 2 hour: 109 mg/dL (ref 65–152)

## 2017-01-16 LAB — CBC
Hematocrit: 34.2 % (ref 34.0–46.6)
Hemoglobin: 11.6 g/dL (ref 11.1–15.9)
MCH: 26.4 pg — AB (ref 26.6–33.0)
MCHC: 33.9 g/dL (ref 31.5–35.7)
MCV: 78 fL — ABNORMAL LOW (ref 79–97)
PLATELETS: 345 10*3/uL (ref 150–379)
RBC: 4.39 x10E6/uL (ref 3.77–5.28)
RDW: 15.7 % — AB (ref 12.3–15.4)
WBC: 10.6 10*3/uL (ref 3.4–10.8)

## 2017-01-16 LAB — HIV ANTIBODY (ROUTINE TESTING W REFLEX): HIV SCREEN 4TH GENERATION: NONREACTIVE

## 2017-01-16 LAB — RPR: RPR Ser Ql: NONREACTIVE

## 2017-01-21 NOTE — L&D Delivery Note (Addendum)
Delivery Note Arrived at room at approximately 1:33PM to find infant on maternal abdomen with cord in process of being clamped and cut by infant's grandmother. Infant crying vigorously with good tone; placenta undelivered.  At 1:32 PM a viable female was delivered via Vaginal, Spontaneous (Presentation: ROA with no nuchal cord. ).  APGAR: 9, 9; weight  pending.   Placenta status: Intact with gentle traction; 3 VC.   Anesthesia:  epidural Episiotomy: None Lacerations: None Suture Repair: NA Est. Blood Loss (mL): 100  Mom to postpartum.  Baby to Couplet care / Skin to Skin.  Alisha GaribaldiKathryn Lorraine [name] 04/15/2017, 2:13 PM  Please schedule this patient for Postpartum visit in: 6 weeks with the following provider: Any provider For C/S patients schedule nurse incision check in weeks 2 weeks: no Low risk pregnancy complicated by: nothing.  Delivery mode:  SVD Anticipated Birth Control:  BTL done PP PP Procedures needed: nothing  Schedule Integrated BH visit: no

## 2017-01-23 ENCOUNTER — Telehealth: Payer: Self-pay | Admitting: *Deleted

## 2017-01-23 NOTE — Telephone Encounter (Signed)
Received message left on nurse voicemail on 01/23/17 at 0800.  Patient states she was seen on 01/15/17 and got a shot.  States she thinks if was a TB shot (was Tdap vaccine).  States the place is sore and there is a knot there.  Wants to know if this is okay.  Requests a return call to [phone].

## 2017-01-28 NOTE — Telephone Encounter (Signed)
Called patient & her mother answered. Asked if she would like Earl know we received her message and yes that can be a normal side effect. She can call us back if she has other questions. Patient's mother states she will let her know

## 2017-01-29 ENCOUNTER — Other Ambulatory Visit: Payer: Self-pay

## 2017-01-29 ENCOUNTER — Ambulatory Visit (HOSPITAL_COMMUNITY)
Admission: RE | Admit: 2017-01-29 | Discharge: 2017-01-29 | Disposition: A | Discharge status: Home / Self Care | Payer: Medicaid Other | Source: Ambulatory Visit

## 2017-01-29 DIAGNOSIS — Z0489 Encounter for examination and observation for other specified reasons: Secondary | ICD-10-CM | POA: Diagnosis present

## 2017-01-29 DIAGNOSIS — Z3483 Encounter for supervision of other normal pregnancy, third trimester: Secondary | ICD-10-CM

## 2017-01-29 DIAGNOSIS — Z3A28 28 weeks gestation of pregnancy: Secondary | ICD-10-CM | POA: Insufficient documentation

## 2017-01-29 DIAGNOSIS — IMO0002 Reserved for concepts with insufficient information to code with codable children: Secondary | ICD-10-CM

## 2017-01-29 DIAGNOSIS — O99213 Obesity complicating pregnancy, third trimester: Secondary | ICD-10-CM

## 2017-02-12 ENCOUNTER — Ambulatory Visit (INDEPENDENT_AMBULATORY_CARE_PROVIDER_SITE_OTHER): Payer: Medicaid Other | Admitting: Medical

## 2017-02-12 ENCOUNTER — Encounter: Payer: Self-pay | Admitting: Medical

## 2017-02-12 VITALS — BP 117/69 | HR 100 | Wt 310.8 lb

## 2017-02-12 DIAGNOSIS — B009 Herpesviral infection, unspecified: Secondary | ICD-10-CM

## 2017-02-12 DIAGNOSIS — Z3483 Encounter for supervision of other normal pregnancy, third trimester: Secondary | ICD-10-CM

## 2017-02-12 DIAGNOSIS — Z3480 Encounter for supervision of other normal pregnancy, unspecified trimester: Secondary | ICD-10-CM

## 2017-02-12 NOTE — Progress Notes (Signed)
   PRENATAL VISIT NOTE  Subjective:  [name] is a 27 y.o. [id number] at 5162w6d being seen today for ongoing prenatal care.  She is currently monitored for the following issues for this low-risk pregnancy and has Morbid obesity with BMI of 45.0-49.9, adult (HCC); Herpes simplex type 2 infection; and Supervision of other normal pregnancy, antepartum on their problem list.  Patient reports no complaints.  Contractions: Not present. Vag. Bleeding: None.  Movement: Present. Denies leaking of fluid.   The following portions of the patient's history were reviewed and updated as appropriate: allergies, current medications, past family history, past medical history, past social history, past surgical history and problem list. Problem list updated.  Objective:   Vitals:   02/12/17 1320  BP: 117/69  Pulse: 100  Weight: (!) 310 lb 12.8 oz (141 kg)    Fetal Status: Fetal Heart Rate (bpm): 132 Fundal Height: 32 cm Movement: Present     General:  Alert, oriented and cooperative. Patient is in no acute distress.  Skin: Skin is warm and dry. No rash noted.   Cardiovascular: Normal heart rate noted  Respiratory: Normal respiratory effort, no problems with respiration noted  Abdomen: Soft, gravid, appropriate for gestational age.  Pain/Pressure: Absent     Pelvic: Cervical exam deferred        Extremities: Normal range of motion.  Edema: None  Mental Status:  Normal mood and affect. Normal behavior. Normal judgment and thought content.   Assessment and Plan:  Pregnancy: [id number] at 5762w6d  1. Supervision of other normal pregnancy, antepartum - Doing well - Few Braxton Hicks contractions at night  2. Herpes simplex type 2 infection - No recent outbreaks - Will need Valtrex started at 36 weeks  Preterm labor symptoms and general obstetric precautions including but not limited to vaginal bleeding, contractions, leaking of fluid and fetal movement were reviewed in detail with the  patient. Please refer to After Visit Summary for other counseling recommendations.  Return in about 2 weeks (around 02/26/2017) for LOB.   Vonzella NippleJulie Wenzel, PA-C

## 2017-02-12 NOTE — Patient Instructions (Signed)

## 2017-02-26 ENCOUNTER — Ambulatory Visit (INDEPENDENT_AMBULATORY_CARE_PROVIDER_SITE_OTHER): Payer: Medicaid Other | Admitting: Family Medicine

## 2017-02-26 VITALS — BP 116/52 | HR 114 | Wt 304.3 lb

## 2017-02-26 DIAGNOSIS — Z3480 Encounter for supervision of other normal pregnancy, unspecified trimester: Secondary | ICD-10-CM

## 2017-02-26 NOTE — Progress Notes (Signed)
   PRENATAL VISIT NOTE  Subjective:  [name] is a 27 y.o. [phone] at 8926w6d being seen today for ongoing prenatal care.  She is currently monitored for the following issues for this low-risk pregnancy and has Morbid obesity with BMI of 45.0-49.9, adult (HCC); Herpes simplex type 2 infection; and Supervision of other normal pregnancy, antepartum on their problem list.  Patient reports no complaints.  Contractions: Irritability. Vag. Bleeding: None.  Movement: Present. Denies leaking of fluid.   The following portions of the patient's history were reviewed and updated as appropriate: allergies, current medications, past family history, past medical history, past social history, past surgical history and problem list. Problem list updated.  Objective:   Vitals:   02/26/17 1524  BP: (!) 116/52  Pulse: (!) 114  Weight: (!) 304 lb 4.8 oz (138 kg)    Fetal Status: Fetal Heart Rate (bpm): 142 Fundal Height: 34 cm Movement: Present     General:  Alert, oriented and cooperative. Patient is in no acute distress.  Skin: Skin is warm and dry. No rash noted.   Cardiovascular: Normal heart rate noted  Respiratory: Normal respiratory effort, no problems with respiration noted  Abdomen: Soft, gravid, appropriate for gestational age.  Pain/Pressure: Absent     Pelvic: Cervical exam deferred        Extremities: Normal range of motion.  Edema: None  Mental Status:  Normal mood and affect. Normal behavior. Normal judgment and thought content.   Assessment and Plan:  Pregnancy: [id number] at 5226w6d  1. Supervision of other normal pregnancy, antepartum   Preterm labor symptoms and general obstetric precautions including but not limited to vaginal bleeding, contractions, leaking of fluid and fetal movement were reviewed in detail with the patient. Please refer to After Visit Summary for other counseling recommendations.  Return in about 2 weeks (around 03/12/2017).   Rolm BookbinderAmber Claude Swendsen, DO

## 2017-02-26 NOTE — Patient Instructions (Signed)
Braxton Hicks Contractions °Contractions of the uterus can occur throughout pregnancy, but they are not always a sign that you are in labor. You may have practice contractions called Braxton Hicks contractions. These false labor contractions are sometimes confused with true labor. °What are Braxton Hicks contractions? °Braxton Hicks contractions are tightening movements that occur in the muscles of the uterus before labor. Unlike true labor contractions, these contractions do not result in opening (dilation) and thinning of the cervix. Toward the end of pregnancy (32-34 weeks), Braxton Hicks contractions can happen more often and may become stronger. These contractions are sometimes difficult to tell apart from true labor because they can be very uncomfortable. You should not feel embarrassed if you go to the hospital with false labor. °Sometimes, the only way to tell if you are in true labor is for your health care provider to look for changes in the cervix. The health care provider will do a physical exam and may monitor your contractions. If you are not in true labor, the exam should show that your cervix is not dilating and your water has not broken. °If there are other health problems associated with your pregnancy, it is completely safe for you to be sent home with false labor. You may continue to have Braxton Hicks contractions until you go into true labor. °How to tell the difference between true labor and false labor °True labor °· Contractions last 30-70 seconds. °· Contractions become very regular. °· Discomfort is usually felt in the top of the uterus, and it spreads to the lower abdomen and low back. °· Contractions do not go away with walking. °· Contractions usually become more intense and increase in frequency. °· The cervix dilates and gets thinner. °False labor °· Contractions are usually shorter and not as strong as true labor contractions. °· Contractions are usually irregular. °· Contractions  are often felt in the front of the lower abdomen and in the groin. °· Contractions may go away when you walk around or change positions while lying down. °· Contractions get weaker and are shorter-lasting as time goes on. °· The cervix usually does not dilate or become thin. °Follow these instructions at home: °· Take over-the-counter and prescription medicines only as told by your health care provider. °· Keep up with your usual exercises and follow other instructions from your health care provider. °· Eat and drink lightly if you think you are going into labor. °· If Braxton Hicks contractions are making you uncomfortable: °? Change your position from lying down or resting to walking, or change from walking to resting. °? Sit and rest in a tub of warm water. °? Drink enough fluid to keep your urine pale yellow. Dehydration may cause these contractions. °? Do slow and deep breathing several times an hour. °· Keep all follow-up prenatal visits as told by your health care provider. This is important. °Contact a health care provider if: °· You have a fever. °· You have continuous pain in your abdomen. °Get help right away if: °· Your contractions become stronger, more regular, and closer together. °· You have fluid leaking or gushing from your vagina. °· You pass blood-tinged mucus (bloody show). °· You have bleeding from your vagina. °· You have low back pain that you never had before. °· You feel your baby’s head pushing down and causing pelvic pressure. °· Your baby is not moving inside you as much as it used to. °Summary °· Contractions that occur before labor are called Braxton   Hicks contractions, false labor, or practice contractions. °· Braxton Hicks contractions are usually shorter, weaker, farther apart, and less regular than true labor contractions. True labor contractions usually become progressively stronger and regular and they become more frequent. °· Manage discomfort from Braxton Hicks contractions by  changing position, resting in a warm bath, drinking plenty of water, or practicing deep breathing. °This information is not intended to replace advice given to you by your health care provider. Make sure you discuss any questions you have with your health care provider. °Document Released: 05/23/2016 Document Revised: 05/23/2016 Document Reviewed: 05/23/2016 °Elsevier Interactive Patient Education © 2018 Elsevier Inc. ° °

## 2017-02-27 ENCOUNTER — Encounter: Payer: Self-pay | Admitting: *Deleted

## 2017-03-12 ENCOUNTER — Ambulatory Visit (INDEPENDENT_AMBULATORY_CARE_PROVIDER_SITE_OTHER): Payer: Medicaid Other | Admitting: Obstetrics and Gynecology

## 2017-03-12 ENCOUNTER — Encounter: Payer: Self-pay | Admitting: *Deleted

## 2017-03-12 VITALS — BP 125/73 | HR 104 | Wt 305.3 lb

## 2017-03-12 DIAGNOSIS — Z6841 Body Mass Index (BMI) 40.0 and over, adult: Secondary | ICD-10-CM

## 2017-03-12 DIAGNOSIS — O26843 Uterine size-date discrepancy, third trimester: Secondary | ICD-10-CM

## 2017-03-12 DIAGNOSIS — B009 Herpesviral infection, unspecified: Secondary | ICD-10-CM

## 2017-03-12 DIAGNOSIS — Z3480 Encounter for supervision of other normal pregnancy, unspecified trimester: Secondary | ICD-10-CM

## 2017-03-12 DIAGNOSIS — Z3483 Encounter for supervision of other normal pregnancy, third trimester: Secondary | ICD-10-CM

## 2017-03-12 MED ORDER — VALACYCLOVIR HCL 500 MG PO TABS: 500.0000 mg | ORAL_TABLET | Freq: Two times a day (BID) | ORAL | 1 refills | Status: DC

## 2017-03-12 NOTE — Patient Instructions (Signed)
Braxton Hicks Contractions °Contractions of the uterus can occur throughout pregnancy, but they are not always a sign that you are in labor. You may have practice contractions called Braxton Hicks contractions. These false labor contractions are sometimes confused with true labor. °What are Braxton Hicks contractions? °Braxton Hicks contractions are tightening movements that occur in the muscles of the uterus before labor. Unlike true labor contractions, these contractions do not result in opening (dilation) and thinning of the cervix. Toward the end of pregnancy (32-34 weeks), Braxton Hicks contractions can happen more often and may become stronger. These contractions are sometimes difficult to tell apart from true labor because they can be very uncomfortable. You should not feel embarrassed if you go to the hospital with false labor. °Sometimes, the only way to tell if you are in true labor is for your health care provider to look for changes in the cervix. The health care provider will do a physical exam and may monitor your contractions. If you are not in true labor, the exam should show that your cervix is not dilating and your water has not broken. °If there are other health problems associated with your pregnancy, it is completely safe for you to be sent home with false labor. You may continue to have Braxton Hicks contractions until you go into true labor. °How to tell the difference between true labor and false labor °True labor °· Contractions last 30-70 seconds. °· Contractions become very regular. °· Discomfort is usually felt in the top of the uterus, and it spreads to the lower abdomen and low back. °· Contractions do not go away with walking. °· Contractions usually become more intense and increase in frequency. °· The cervix dilates and gets thinner. °False labor °· Contractions are usually shorter and not as strong as true labor contractions. °· Contractions are usually irregular. °· Contractions  are often felt in the front of the lower abdomen and in the groin. °· Contractions may go away when you walk around or change positions while lying down. °· Contractions get weaker and are shorter-lasting as time goes on. °· The cervix usually does not dilate or become thin. °Follow these instructions at home: °· Take over-the-counter and prescription medicines only as told by your health care provider. °· Keep up with your usual exercises and follow other instructions from your health care provider. °· Eat and drink lightly if you think you are going into labor. °· If Braxton Hicks contractions are making you uncomfortable: °? Change your position from lying down or resting to walking, or change from walking to resting. °? Sit and rest in a tub of warm water. °? Drink enough fluid to keep your urine pale yellow. Dehydration may cause these contractions. °? Do slow and deep breathing several times an hour. °· Keep all follow-up prenatal visits as told by your health care provider. This is important. °Contact a health care provider if: °· You have a fever. °· You have continuous pain in your abdomen. °Get help right away if: °· Your contractions become stronger, more regular, and closer together. °· You have fluid leaking or gushing from your vagina. °· You pass blood-tinged mucus (bloody show). °· You have bleeding from your vagina. °· You have low back pain that you never had before. °· You feel your baby’s head pushing down and causing pelvic pressure. °· Your baby is not moving inside you as much as it used to. °Summary °· Contractions that occur before labor are called Braxton   Hicks contractions, false labor, or practice contractions. °· Braxton Hicks contractions are usually shorter, weaker, farther apart, and less regular than true labor contractions. True labor contractions usually become progressively stronger and regular and they become more frequent. °· Manage discomfort from Braxton Hicks contractions by  changing position, resting in a warm bath, drinking plenty of water, or practicing deep breathing. °This information is not intended to replace advice given to you by your health care provider. Make sure you discuss any questions you have with your health care provider. °Document Released: 05/23/2016 Document Revised: 05/23/2016 Document Reviewed: 05/23/2016 °Elsevier Interactive Patient Education © 2018 Elsevier Inc. ° °

## 2017-03-12 NOTE — Progress Notes (Signed)
Subjective:  Jerrilyn CairoDeshontala Istre is a 27 y.o. [id number] at 1317w6d being seen today for ongoing prenatal care.  She is currently monitored for the following issues for this low-risk pregnancy and has Morbid obesity with BMI of 45.0-49.9, adult (HCC); Herpes simplex type 2 infection; and Supervision of other normal pregnancy, antepartum on their problem list.  Patient reports cold symtpoms.  Contractions: Irritability. Vag. Bleeding: None.  Movement: Present. Denies leaking of fluid.   The following portions of the patient's history were reviewed and updated as appropriate: allergies, current medications, past family history, past medical history, past social history, past surgical history and problem list. Problem list updated.  Objective:   Vitals:   03/12/17 1401  BP: 125/73  Pulse: (!) 104  Weight: (!) 138.5 kg (305 lb 4.8 oz)    Fetal Status: Fetal Heart Rate (bpm): 148 Fundal Height: 40 cm Movement: Present     General:  Alert, oriented and cooperative. Patient is in no acute distress.  Skin: Skin is warm and dry. No rash noted.   Cardiovascular: Normal heart rate noted  Respiratory: Normal respiratory effort, no problems with respiration noted  Abdomen: Soft, gravid, appropriate for gestational age. Pain/Pressure: Absent     Pelvic: Vag. Bleeding: None     Cervical exam deferred        Extremities: Normal range of motion.  Edema: None  Mental Status: Normal mood and affect. Normal behavior. Normal judgment and thought content.   Urinalysis:      Assessment and Plan:  Pregnancy: [id number] at 5317w6d  1. Supervision of other normal pregnancy, antepartum Doing well. Continue routine care. Patient with viral illness and having flare up of asthma. Patient given okay to take albuterol nebulizer.   2. Herpes simplex type 2 infection Asymptomatic. Rx sent to start Valtrex at 36 weeks.   3. Morbid obesity with BMI of 45.0-49.9, adult (HCC) Overall has not had any weight gain. Down -23lbs  overall. Continues to eat well without n/v.   4. Uterine size-date discrepancy in third trimester Will order US - US MFM OB FOLLOW UP; Future   Preterm labor symptoms and general obstetric precautions including but not limited to vaginal bleeding, contractions, leaking of fluid and fetal movement were reviewed in detail with the patient. Please refer to After Visit Summary for other counseling recommendations.  Return in about 2 weeks (around 03/26/2017) for ob visit.   Pincus LargePhelps, Jazma Y, DO

## 2017-03-17 ENCOUNTER — Ambulatory Visit (HOSPITAL_COMMUNITY)
Admission: RE | Admit: 2017-03-17 | Discharge: 2017-03-17 | Disposition: A | Discharge status: Home / Self Care | Payer: Medicaid Other | Source: Ambulatory Visit | Attending: Obstetrics and Gynecology | Admitting: Obstetrics and Gynecology

## 2017-03-17 DIAGNOSIS — O99213 Obesity complicating pregnancy, third trimester: Secondary | ICD-10-CM | POA: Insufficient documentation

## 2017-03-17 DIAGNOSIS — Z3A35 35 weeks gestation of pregnancy: Secondary | ICD-10-CM | POA: Insufficient documentation

## 2017-03-17 DIAGNOSIS — O26843 Uterine size-date discrepancy, third trimester: Secondary | ICD-10-CM

## 2017-03-25 ENCOUNTER — Ambulatory Visit (INDEPENDENT_AMBULATORY_CARE_PROVIDER_SITE_OTHER): Payer: Medicaid Other | Admitting: Obstetrics and Gynecology

## 2017-03-25 ENCOUNTER — Encounter: Payer: Self-pay | Admitting: Obstetrics and Gynecology

## 2017-03-25 ENCOUNTER — Other Ambulatory Visit (HOSPITAL_COMMUNITY)
Admission: RE | Admit: 2017-03-25 | Discharge: 2017-03-25 | Disposition: A | Discharge status: Home / Self Care | Payer: Medicaid Other | Source: Ambulatory Visit | Attending: Obstetrics and Gynecology | Admitting: Obstetrics and Gynecology

## 2017-03-25 VITALS — BP 125/73 | HR 107 | Wt 314.0 lb

## 2017-03-25 DIAGNOSIS — B009 Herpesviral infection, unspecified: Secondary | ICD-10-CM

## 2017-03-25 DIAGNOSIS — Z3480 Encounter for supervision of other normal pregnancy, unspecified trimester: Secondary | ICD-10-CM

## 2017-03-25 DIAGNOSIS — Z3483 Encounter for supervision of other normal pregnancy, third trimester: Secondary | ICD-10-CM | POA: Insufficient documentation

## 2017-03-25 DIAGNOSIS — Z3009 Encounter for other general counseling and advice on contraception: Secondary | ICD-10-CM

## 2017-03-25 DIAGNOSIS — Z6841 Body Mass Index (BMI) 40.0 and over, adult: Secondary | ICD-10-CM

## 2017-03-25 LAB — OB RESULTS CONSOLE GBS: GBS: NEGATIVE

## 2017-03-25 NOTE — Progress Notes (Signed)
   PRENATAL VISIT NOTE  Subjective:  [name] is a 27 y.o. [id number] at 2963w5d being seen today for ongoing prenatal care.  She is currently monitored for the following issues for this low-risk pregnancy and has Morbid obesity with BMI of 45.0-49.9, adult (HCC); Herpes simplex type 2 infection; Supervision of other normal pregnancy, antepartum; and Unwanted fertility on their problem list.  Patient reports occasional contractions.  Contractions: Not present. Vag. Bleeding: None.  Movement: Present. Denies leaking of fluid.   The following portions of the patient's history were reviewed and updated as appropriate: allergies, current medications, past family history, past medical history, past social history, past surgical history and problem list. Problem list updated.  Objective:   Vitals:   03/25/17 1344  BP: 125/73  Pulse: (!) 107  Weight: (!) 314 lb (142.4 kg)    Fetal Status: Fetal Heart Rate (bpm): 158   Movement: Present     General:  Alert, oriented and cooperative. Patient is in no acute distress.  Skin: Skin is warm and dry. No rash noted.   Cardiovascular: Normal heart rate noted  Respiratory: Normal respiratory effort, no problems with respiration noted  Abdomen: Soft, gravid, appropriate for gestational age.  Pain/Pressure: Absent     Pelvic: Cervical exam deferred        Extremities: Normal range of motion.     Mental Status:  Normal mood and affect. Normal behavior. Normal judgment and thought content.   Assessment and Plan:  Pregnancy: [id number] at 6363w5d  1. Herpes simplex type 2 infection On valtrex  2. Supervision of other normal pregnancy, antepartum - GC/Chlamydia probe amp ([place] - Culture, beta strep (group b only) Last growth 2/25 66th%tile  3. Morbid obesity with BMI of 45.0-49.9, adult (HCC)  4. Unwanted fertility BTL papers signed 02/26/17   Preterm labor symptoms and general obstetric precautions including but not limited  to vaginal bleeding, contractions, leaking of fluid and fetal movement were reviewed in detail with the patient. Please refer to After Visit Summary for other counseling recommendations.  Return in about 1 week (around 04/01/2017) for OB visit.   Conan BowensKelly M Davis, MD

## 2017-03-26 LAB — GC/CHLAMYDIA PROBE AMP (~~LOC~~) NOT AT ARMC
Chlamydia: NEGATIVE
NEISSERIA GONORRHEA: NEGATIVE

## 2017-03-28 ENCOUNTER — Encounter: Payer: Self-pay | Admitting: *Deleted

## 2017-03-29 LAB — CULTURE, BETA STREP (GROUP B ONLY): Strep Gp B Culture: NEGATIVE

## 2017-04-02 ENCOUNTER — Ambulatory Visit (INDEPENDENT_AMBULATORY_CARE_PROVIDER_SITE_OTHER): Payer: Medicaid Other | Admitting: Advanced Practice Midwife

## 2017-04-02 VITALS — BP 119/66 | HR 95

## 2017-04-02 DIAGNOSIS — Z3480 Encounter for supervision of other normal pregnancy, unspecified trimester: Secondary | ICD-10-CM

## 2017-04-02 DIAGNOSIS — Z3483 Encounter for supervision of other normal pregnancy, third trimester: Secondary | ICD-10-CM

## 2017-04-02 DIAGNOSIS — B009 Herpesviral infection, unspecified: Secondary | ICD-10-CM

## 2017-04-02 NOTE — Progress Notes (Signed)
   PRENATAL VISIT NOTE  Subjective:  [name] is a 27 y.o. [id number] at 393w6d being seen today for ongoing prenatal care.  She is currently monitored for the following issues for this low-risk pregnancy and has Morbid obesity with BMI of 45.0-49.9, adult (HCC); Herpes simplex type 2 infection; Supervision of other normal pregnancy, antepartum; and Unwanted fertility on their problem list.  Patient reports occasional contractions.  Contractions: Irregular. Vag. Bleeding: None.  Movement: Present. Denies leaking of fluid.   The following portions of the patient's history were reviewed and updated as appropriate: allergies, current medications, past family history, past medical history, past social history, past surgical history and problem list. Problem list updated.  Objective:  There were no vitals filed for this visit.  Fetal Status: Fetal Heart Rate (bpm): 141 Fundal Height: 40 cm Movement: Present     General:  Alert, oriented and cooperative. Patient is in no acute distress.  Skin: Skin is warm and dry. No rash noted.   Cardiovascular: Normal heart rate noted  Respiratory: Normal respiratory effort, no problems with respiration noted  Abdomen: Soft, gravid, appropriate for gestational age.  Pain/Pressure: Present     Pelvic: Cervical exam performed Dilation: 1.5 Effacement (%): 50 Station: Ballotable  Extremities: Normal range of motion.  Edema: Mild pitting, slight indentation  Mental Status:  Normal mood and affect. Normal behavior. Normal judgment and thought content.   Assessment and Plan:  Pregnancy: [id number] at 503w6d  1. Supervision of other normal pregnancy, antepartum   2. Herpes simplex type 2 infection - Has Valtrex for prophylaxis.   Term labor symptoms and general obstetric precautions including but not limited to vaginal bleeding, contractions, leaking of fluid and fetal movement were reviewed in detail with the patient. Please refer to After Visit Summary  for other counseling recommendations.  Return for ROB in 1 week.   Dorathy KinsmanVirginia Vickie [name], CNM

## 2017-04-09 ENCOUNTER — Ambulatory Visit (INDEPENDENT_AMBULATORY_CARE_PROVIDER_SITE_OTHER): Payer: Medicaid Other | Admitting: Advanced Practice Midwife

## 2017-04-09 VITALS — BP 115/76 | HR 85 | Wt 318.6 lb

## 2017-04-09 DIAGNOSIS — Z3480 Encounter for supervision of other normal pregnancy, unspecified trimester: Secondary | ICD-10-CM

## 2017-04-09 NOTE — Progress Notes (Signed)
Pt would like Pelvic Exam

## 2017-04-09 NOTE — Progress Notes (Signed)
   PRENATAL VISIT NOTE  Subjective:  [name] is a 27 y.o. [id number] at [redacted]w[redacted]d being seen today for ongoing prenatal care.  She is currently monitored for the following issues for this low-risk pregnancy and has Morbid obesity with BMI of 45.0-49.9, adult (HCC); Herpes simplex type 2 infection; Supervision of other normal pregnancy, antepartum; and Unwanted fertility on their problem list.  Patient reports occasional contractions.  Contractions: Irritability. Vag. Bleeding: None.  Movement: Present. Denies leaking of fluid.   The following portions of the patient's history were reviewed and updated as appropriate: allergies, current medications, past family history, past medical history, past social history, past surgical history and problem list. Problem list updated.  Objective:   Vitals:   04/09/17 1150  BP: 115/76  Pulse: 85  Weight: (!) 318 lb 9.6 oz (144.5 kg)    Fetal Status: Fetal Heart Rate (bpm): 143 Fundal Height: 40 cm Movement: Present  Presentation: Vertex  General:  Alert, oriented and cooperative. Patient is in no acute distress.  Skin: Skin is warm and dry. No rash noted.   Cardiovascular: Normal heart rate noted  Respiratory: Normal respiratory effort, no problems with respiration noted  Abdomen: Soft, gravid, appropriate for gestational age.  Pain/Pressure: Present     Pelvic: Cervical exam performed Dilation: 1.5 Effacement (%): 50 Station: Ballotable  Extremities: Normal range of motion.  Edema: Mild pitting, slight indentation  Mental Status:  Normal mood and affect. Normal behavior. Normal judgment and thought content.   Assessment and Plan:  Pregnancy: [id number] at [redacted]w[redacted]d  1. Supervision of other normal pregnancy, antepartum   Term labor symptoms and general obstetric precautions including but not limited to vaginal bleeding, contractions, leaking of fluid and fetal movement were reviewed in detail with the patient. Please refer to After Visit Summary  for other counseling recommendations.   F/U 1 week    [place] Audrea Bolte, [place]

## 2017-04-09 NOTE — Patient Instructions (Signed)
Laparoscopic Tubal Ligation Laparoscopic tubal ligation is a procedure to close the fallopian tubes. This is done so that you cannot get pregnant. When the fallopian tubes are closed, the eggs that your ovaries release cannot enter the uterus, and sperm cannot reach the released eggs. A laparoscopic tubal ligation is sometimes called "getting your tubes tied." You should not have this procedure if you want to get pregnant someday or if you are unsure about having more children. Tell a health care provider about:  Any allergies you have.  All medicines you are taking, including vitamins, herbs, eye drops, creams, and over-the-counter medicines.  Any problems you or family members have had with anesthetic medicines.  Any blood disorders you have.  Any surgeries you have had.  Any medical conditions you have.  Whether you are pregnant or may be pregnant.  Any past pregnancies. What are the risks? Generally, this is a safe procedure. However, problems may occur, including:  Infection.  Bleeding.  Injury to surrounding organs.  Side effects from anesthetics.  Failure of the procedure.  This procedure can increase your risk of a kind of pregnancy in which a fertilized egg attaches to the outside of the uterus (ectopic pregnancy). What happens before the procedure?  Ask your health care provider about: ? Changing or stopping your regular medicines. This is especially important if you are taking diabetes medicines or blood thinners. ? Taking medicines such as aspirin and ibuprofen. These medicines can thin your blood. Do not take these medicines before your procedure if your health care provider instructs you not to.  Follow instructions from your health care provider about eating and drinking restrictions.  Plan to have someone take you home after the procedure.  If you go home right after the procedure, plan to have someone with you for 24 hours. What happens during the  procedure?  You will be given one or more of the following: ? A medicine to help you relax (sedative). ? A medicine to numb the area (local anesthetic). ? A medicine to make you fall asleep (general anesthetic). ? A medicine that is injected into an area of your body to numb everything below the injection site (regional anesthetic).  An IV tube will be inserted into one of your veins. It will be used to give you medicines and fluids during the procedure.  Your bladder may be emptied with a small tube (catheter).  If you have been given a general anesthetic, a tube will be put down your throat to help you breathe.  Two small cuts (incisions) will be made in your lower abdomen and near your belly button.  Your abdomen will be inflated with a gas. This will let the surgeon see better and will give the surgeon room to work.  A thin, lighted tube (laparoscope) with a camera attached will be inserted into your abdomen through one of the incisions. Small instruments will be inserted through the other incision.  The fallopian tubes will be tied off, burned (cauterized), or blocked with a clip, ring, or clamp. A small portion in the center of each fallopian tube may be removed.  The gas will be released from the abdomen.  The incisions will be closed with stitches (sutures).  A bandage (dressing) will be placed over the incisions. The procedure may vary among health care providers and hospitals. What happens after the procedure?  Your blood pressure, heart rate, breathing rate, and blood oxygen level will be monitored often until the medicines you   were given have worn off.  You will be given medicine to help with pain, nausea, and vomiting as needed. This information is not intended to replace advice given to you by your health care provider. Make sure you discuss any questions you have with your health care provider. Document Released: 04/15/2000 Document Revised: 06/15/2015 Document  Reviewed: 12/18/2014 Elsevier Interactive Patient Education  2018 Elsevier Inc.  

## 2017-04-15 ENCOUNTER — Inpatient Hospital Stay (HOSPITAL_COMMUNITY): Payer: Medicaid Other | Admitting: Anesthesiology

## 2017-04-15 ENCOUNTER — Other Ambulatory Visit: Payer: Self-pay

## 2017-04-15 ENCOUNTER — Encounter (HOSPITAL_COMMUNITY): Payer: Self-pay

## 2017-04-15 ENCOUNTER — Inpatient Hospital Stay (HOSPITAL_COMMUNITY)
Admission: AD | Admit: 2017-04-15 | Discharge: 2017-04-16 | DRG: 797 | Disposition: A | Discharge status: Home / Self Care | Payer: Medicaid Other | Source: Ambulatory Visit | Attending: Obstetrics and Gynecology | Admitting: Obstetrics and Gynecology

## 2017-04-15 ENCOUNTER — Encounter (HOSPITAL_COMMUNITY)
Admission: AD | Disposition: A | Discharge status: Home / Self Care | Payer: Self-pay | Source: Ambulatory Visit | Attending: Obstetrics and Gynecology

## 2017-04-15 DIAGNOSIS — A6000 Herpesviral infection of urogenital system, unspecified: Secondary | ICD-10-CM | POA: Diagnosis present

## 2017-04-15 DIAGNOSIS — Z3009 Encounter for other general counseling and advice on contraception: Secondary | ICD-10-CM

## 2017-04-15 DIAGNOSIS — O99214 Obesity complicating childbirth: Secondary | ICD-10-CM | POA: Diagnosis present

## 2017-04-15 DIAGNOSIS — O4292 Full-term premature rupture of membranes, unspecified as to length of time between rupture and onset of labor: Principal | ICD-10-CM | POA: Diagnosis present

## 2017-04-15 DIAGNOSIS — Z3A39 39 weeks gestation of pregnancy: Secondary | ICD-10-CM

## 2017-04-15 DIAGNOSIS — Z302 Encounter for sterilization: Secondary | ICD-10-CM

## 2017-04-15 DIAGNOSIS — B009 Herpesviral infection, unspecified: Secondary | ICD-10-CM

## 2017-04-15 DIAGNOSIS — Z3480 Encounter for supervision of other normal pregnancy, unspecified trimester: Secondary | ICD-10-CM

## 2017-04-15 DIAGNOSIS — Z7982 Long term (current) use of aspirin: Secondary | ICD-10-CM

## 2017-04-15 DIAGNOSIS — O9832 Other infections with a predominantly sexual mode of transmission complicating childbirth: Secondary | ICD-10-CM | POA: Diagnosis present

## 2017-04-15 DIAGNOSIS — Z6841 Body Mass Index (BMI) 40.0 and over, adult: Secondary | ICD-10-CM

## 2017-04-15 DIAGNOSIS — Z87891 Personal history of nicotine dependence: Secondary | ICD-10-CM | POA: Diagnosis not present

## 2017-04-15 DIAGNOSIS — O4202 Full-term premature rupture of membranes, onset of labor within 24 hours of rupture: Secondary | ICD-10-CM | POA: Diagnosis present

## 2017-04-15 HISTORY — PX: TUBAL LIGATION: SHX77

## 2017-04-15 LAB — TYPE AND SCREEN
ABO/RH(D): O POS
ANTIBODY SCREEN: NEGATIVE

## 2017-04-15 LAB — CBC
HCT: 34.8 % — ABNORMAL LOW (ref 36.0–46.0)
HEMOGLOBIN: 11.7 g/dL — AB (ref 12.0–15.0)
MCH: 24.4 pg — ABNORMAL LOW (ref 26.0–34.0)
MCHC: 33.6 g/dL (ref 30.0–36.0)
MCV: 72.5 fL — ABNORMAL LOW (ref 78.0–100.0)
Platelets: 354 10*3/uL (ref 150–400)
RBC: 4.8 MIL/uL (ref 3.87–5.11)
RDW: 17.8 % — ABNORMAL HIGH (ref 11.5–15.5)
WBC: 12.3 10*3/uL — AB (ref 4.0–10.5)

## 2017-04-15 LAB — POCT FERN TEST: POCT FERN TEST: POSITIVE

## 2017-04-15 SURGERY — LIGATION, FALLOPIAN TUBE, POSTPARTUM
Anesthesia: Epidural

## 2017-04-15 MED ORDER — FENTANYL CITRATE (PF) 100 MCG/2ML IJ SOLN: 100.0000 ug | INTRAMUSCULAR | Status: DC | PRN

## 2017-04-15 MED ORDER — OXYCODONE HCL 5 MG/5ML PO SOLN: 5.0000 mg | Freq: Once | ORAL | Status: DC | PRN

## 2017-04-15 MED ORDER — DIBUCAINE 1 % RE OINT: 1.0000 "application " | TOPICAL_OINTMENT | RECTAL | Status: DC | PRN

## 2017-04-15 MED ORDER — SIMETHICONE 80 MG PO CHEW: 80.0000 mg | CHEWABLE_TABLET | ORAL | Status: DC | PRN

## 2017-04-15 MED ORDER — OXYTOCIN 40 UNITS IN LACTATED RINGERS INFUSION - SIMPLE MED
2.5000 [IU]/h | INTRAVENOUS | Status: DC
  Filled 2017-04-15: qty 1000

## 2017-04-15 MED ORDER — MIDAZOLAM HCL 5 MG/5ML IJ SOLN
INTRAMUSCULAR | Status: DC | PRN
  Administered 2017-04-15 (×2): 2 mg via INTRAVENOUS

## 2017-04-15 MED ORDER — PROMETHAZINE HCL 25 MG/ML IJ SOLN: 6.2500 mg | INTRAMUSCULAR | Status: DC | PRN

## 2017-04-15 MED ORDER — EPHEDRINE 5 MG/ML INJ: 10.0000 mg | INTRAVENOUS | Status: DC | PRN

## 2017-04-15 MED ORDER — OXYTOCIN 40 UNITS IN LACTATED RINGERS INFUSION - SIMPLE MED
1.0000 m[IU]/min | INTRAVENOUS | Status: DC
  Administered 2017-04-15: 2 m[IU]/min via INTRAVENOUS

## 2017-04-15 MED ORDER — OXYCODONE-ACETAMINOPHEN 5-325 MG PO TABS: 1.0000 | ORAL_TABLET | ORAL | Status: DC | PRN

## 2017-04-15 MED ORDER — DIPHENHYDRAMINE HCL 25 MG PO CAPS: 25.0000 mg | ORAL_CAPSULE | Freq: Four times a day (QID) | ORAL | Status: DC | PRN

## 2017-04-15 MED ORDER — FENTANYL CITRATE (PF) 100 MCG/2ML IJ SOLN
INTRAMUSCULAR | Status: DC | PRN
  Administered 2017-04-15 (×2): 100 ug via INTRAVENOUS

## 2017-04-15 MED ORDER — FLEET ENEMA 7-19 GM/118ML RE ENEM: 1.0000 | ENEMA | RECTAL | Status: DC | PRN

## 2017-04-15 MED ORDER — ONDANSETRON HCL 4 MG/2ML IJ SOLN: 4.0000 mg | INTRAMUSCULAR | Status: DC | PRN

## 2017-04-15 MED ORDER — WITCH HAZEL-GLYCERIN EX PADS: 1.0000 "application " | MEDICATED_PAD | CUTANEOUS | Status: DC | PRN

## 2017-04-15 MED ORDER — FENTANYL CITRATE (PF) 100 MCG/2ML IJ SOLN
INTRAMUSCULAR | Status: AC
  Filled 2017-04-15: qty 2

## 2017-04-15 MED ORDER — TERBUTALINE SULFATE 1 MG/ML IJ SOLN: 0.2500 mg | Freq: Once | INTRAMUSCULAR | Status: DC | PRN

## 2017-04-15 MED ORDER — ZOLPIDEM TARTRATE 5 MG PO TABS: 5.0000 mg | ORAL_TABLET | Freq: Every evening | ORAL | Status: DC | PRN

## 2017-04-15 MED ORDER — IBUPROFEN 600 MG PO TABS: 600.0000 mg | ORAL_TABLET | Freq: Four times a day (QID) | ORAL | Status: DC

## 2017-04-15 MED ORDER — COCONUT OIL OIL: 1.0000 "application " | TOPICAL_OIL | Status: DC | PRN

## 2017-04-15 MED ORDER — OXYCODONE-ACETAMINOPHEN 5-325 MG PO TABS: 2.0000 | ORAL_TABLET | ORAL | Status: DC | PRN

## 2017-04-15 MED ORDER — PROPOFOL 10 MG/ML IV BOLUS
INTRAVENOUS | Status: DC | PRN
  Administered 2017-04-15: 30 mg via INTRAVENOUS

## 2017-04-15 MED ORDER — SENNOSIDES-DOCUSATE SODIUM 8.6-50 MG PO TABS
2.0000 | ORAL_TABLET | ORAL | Status: DC
  Administered 2017-04-16: 2 via ORAL
  Filled 2017-04-15: qty 2

## 2017-04-15 MED ORDER — PHENYLEPHRINE 40 MCG/ML (10ML) SYRINGE FOR IV PUSH (FOR BLOOD PRESSURE SUPPORT)
80.0000 ug | PREFILLED_SYRINGE | INTRAVENOUS | Status: DC | PRN
  Filled 2017-04-15: qty 10

## 2017-04-15 MED ORDER — PRENATAL MULTIVITAMIN CH
1.0000 | ORAL_TABLET | Freq: Every day | ORAL | Status: DC
  Administered 2017-04-16: 1 via ORAL
  Filled 2017-04-15: qty 1

## 2017-04-15 MED ORDER — OXYCODONE HCL 5 MG PO TABS: 5.0000 mg | ORAL_TABLET | Freq: Once | ORAL | Status: DC | PRN

## 2017-04-15 MED ORDER — ACETAMINOPHEN 325 MG PO TABS: 650.0000 mg | ORAL_TABLET | ORAL | Status: DC | PRN

## 2017-04-15 MED ORDER — SOD CITRATE-CITRIC ACID 500-334 MG/5ML PO SOLN
30.0000 mL | ORAL | Status: DC | PRN
  Administered 2017-04-15: 30 mL via ORAL
  Filled 2017-04-15: qty 15

## 2017-04-15 MED ORDER — TETANUS-DIPHTH-ACELL PERTUSSIS 5-2.5-18.5 LF-MCG/0.5 IM SUSP: 0.5000 mL | Freq: Once | INTRAMUSCULAR | Status: DC

## 2017-04-15 MED ORDER — LIDOCAINE HCL (PF) 1 % IJ SOLN
30.0000 mL | INTRAMUSCULAR | Status: DC | PRN
  Filled 2017-04-15: qty 30

## 2017-04-15 MED ORDER — BENZOCAINE-MENTHOL 20-0.5 % EX AERO
1.0000 | INHALATION_SPRAY | CUTANEOUS | Status: DC | PRN
Start: 2017-04-15 — End: 2017-04-16

## 2017-04-15 MED ORDER — LIDOCAINE HCL (PF) 1 % IJ SOLN
INTRAMUSCULAR | Status: DC | PRN
  Administered 2017-04-15: 13 mL via EPIDURAL

## 2017-04-15 MED ORDER — SODIUM BICARBONATE 8.4 % IV SOLN
INTRAVENOUS | Status: AC
  Filled 2017-04-15: qty 50

## 2017-04-15 MED ORDER — LACTATED RINGERS IV SOLN
500.0000 mL | Freq: Once | INTRAVENOUS | Status: AC
  Administered 2017-04-15: 500 mL via INTRAVENOUS

## 2017-04-15 MED ORDER — PROPOFOL 10 MG/ML IV BOLUS
INTRAVENOUS | Status: AC
  Filled 2017-04-15: qty 20

## 2017-04-15 MED ORDER — LACTATED RINGERS IV SOLN
INTRAVENOUS | Status: DC
  Administered 2017-04-15 (×2): via INTRAVENOUS

## 2017-04-15 MED ORDER — ACETAMINOPHEN 325 MG PO TABS
650.0000 mg | ORAL_TABLET | ORAL | Status: DC | PRN
  Administered 2017-04-16 (×2): 650 mg via ORAL
  Filled 2017-04-15 (×2): qty 2

## 2017-04-15 MED ORDER — OXYTOCIN BOLUS FROM INFUSION
500.0000 mL | Freq: Once | INTRAVENOUS | Status: AC
  Administered 2017-04-15: 500 mL via INTRAVENOUS

## 2017-04-15 MED ORDER — BENZOCAINE-MENTHOL 20-0.5 % EX AERO: 1.0000 "application " | INHALATION_SPRAY | CUTANEOUS | Status: DC | PRN

## 2017-04-15 MED ORDER — FAMOTIDINE 20 MG PO TABS
20.0000 mg | ORAL_TABLET | Freq: Two times a day (BID) | ORAL | Status: DC
  Filled 2017-04-15 (×2): qty 1

## 2017-04-15 MED ORDER — MIDAZOLAM HCL 2 MG/2ML IJ SOLN
INTRAMUSCULAR | Status: AC
  Filled 2017-04-15: qty 2

## 2017-04-15 MED ORDER — ONDANSETRON HCL 4 MG PO TABS: 4.0000 mg | ORAL_TABLET | ORAL | Status: DC | PRN

## 2017-04-15 MED ORDER — PRENATAL MULTIVITAMIN CH: 1.0000 | ORAL_TABLET | Freq: Every day | ORAL | Status: DC

## 2017-04-15 MED ORDER — SODIUM CHLORIDE 0.9 % IR SOLN
Status: DC | PRN
  Administered 2017-04-15: 500 mL

## 2017-04-15 MED ORDER — HYDROMORPHONE HCL 1 MG/ML IJ SOLN: 0.2500 mg | INTRAMUSCULAR | Status: DC | PRN

## 2017-04-15 MED ORDER — LACTATED RINGERS IV SOLN: 500.0000 mL | INTRAVENOUS | Status: DC | PRN

## 2017-04-15 MED ORDER — DIPHENHYDRAMINE HCL 50 MG/ML IJ SOLN: 12.5000 mg | INTRAMUSCULAR | Status: DC | PRN

## 2017-04-15 MED ORDER — BUPIVACAINE HCL (PF) 0.25 % IJ SOLN
INTRAMUSCULAR | Status: DC | PRN
  Administered 2017-04-15: 20 mL

## 2017-04-15 MED ORDER — SODIUM BICARBONATE 8.4 % IV SOLN
INTRAVENOUS | Status: DC | PRN
  Administered 2017-04-15 (×4): 5 mL via EPIDURAL

## 2017-04-15 MED ORDER — SENNOSIDES-DOCUSATE SODIUM 8.6-50 MG PO TABS: 2.0000 | ORAL_TABLET | ORAL | Status: DC

## 2017-04-15 MED ORDER — ONDANSETRON HCL 4 MG/2ML IJ SOLN: 4.0000 mg | Freq: Four times a day (QID) | INTRAMUSCULAR | Status: DC | PRN

## 2017-04-15 MED ORDER — PHENYLEPHRINE 40 MCG/ML (10ML) SYRINGE FOR IV PUSH (FOR BLOOD PRESSURE SUPPORT): 80.0000 ug | PREFILLED_SYRINGE | INTRAVENOUS | Status: DC | PRN

## 2017-04-15 MED ORDER — BUPIVACAINE HCL (PF) 0.25 % IJ SOLN
INTRAMUSCULAR | Status: AC
  Filled 2017-04-15: qty 30

## 2017-04-15 MED ORDER — IBUPROFEN 600 MG PO TABS
600.0000 mg | ORAL_TABLET | Freq: Four times a day (QID) | ORAL | Status: DC
  Administered 2017-04-15 – 2017-04-16 (×4): 600 mg via ORAL
  Filled 2017-04-15 (×4): qty 1

## 2017-04-15 MED ORDER — FENTANYL 2.5 MCG/ML BUPIVACAINE 1/10 % EPIDURAL INFUSION (WH - ANES)
14.0000 mL/h | INTRAMUSCULAR | Status: DC | PRN
  Administered 2017-04-15: 14 mL/h via EPIDURAL
  Filled 2017-04-15: qty 100

## 2017-04-15 SURGICAL SUPPLY — 21 items
BLADE SURG 11 STRL SS (BLADE) ×3 IMPLANT
CLOTH BEACON ORANGE TIMEOUT ST (SAFETY) ×3 IMPLANT
DRSG OPSITE POSTOP 3X4 (GAUZE/BANDAGES/DRESSINGS) ×3 IMPLANT
DURAPREP 26ML APPLICATOR (WOUND CARE) ×3 IMPLANT
GLOVE BIOGEL PI IND STRL 7.0 (GLOVE) ×1 IMPLANT
GLOVE BIOGEL PI IND STRL 7.5 (GLOVE) ×1 IMPLANT
GLOVE BIOGEL PI INDICATOR 7.0 (GLOVE) ×2
GLOVE BIOGEL PI INDICATOR 7.5 (GLOVE) ×2
GLOVE ECLIPSE 7.5 STRL STRAW (GLOVE) ×3 IMPLANT
GOWN STRL REUS W/TWL LRG LVL3 (GOWN DISPOSABLE) ×6 IMPLANT
NEEDLE HYPO 22GX1.5 SAFETY (NEEDLE) ×3 IMPLANT
NS IRRIG 1000ML POUR BTL (IV SOLUTION) ×3 IMPLANT
PACK ABDOMINAL MINOR (CUSTOM PROCEDURE TRAY) ×3 IMPLANT
PROTECTOR NERVE ULNAR (MISCELLANEOUS) ×3 IMPLANT
SPONGE LAP 4X18 X RAY DECT (DISPOSABLE) IMPLANT
SUT VICRYL 0 UR6 27IN ABS (SUTURE) ×3 IMPLANT
SUT VICRYL 4-0 PS2 18IN ABS (SUTURE) ×3 IMPLANT
SYR CONTROL 10ML LL (SYRINGE) ×3 IMPLANT
TOWEL OR 17X24 6PK STRL BLUE (TOWEL DISPOSABLE) ×6 IMPLANT
TRAY FOLEY CATH SILVER 14FR (SET/KITS/TRAYS/PACK) ×3 IMPLANT
WATER STERILE IRR 1000ML POUR (IV SOLUTION) ×3 IMPLANT

## 2017-04-15 NOTE — Progress Notes (Signed)
Pt nauseous refusing Zofran at this time. Eating ice chips at this time.   Yona Kosek L Sacoya Mcgourty, RN

## 2017-04-15 NOTE — MAU Note (Signed)
Pt reports contractions every 6-7 mins. Pt reports bloody show. Denies LOF. Reports good fetal movement. States she was 1.5cm last week.

## 2017-04-15 NOTE — Anesthesia Pain Management Evaluation Note (Signed)
  CRNA Pain Management Visit Note  Patient: [name], 27 y.o., female  "Hello I am a [facility] of the [facility]. We have an anesthesia team available at all times to provide care throughout the hospital, including epidural management and anesthesia for C-section. I don't know your plan for the delivery whether it a natural birth, water birth, IV sedation, nitrous supplementation, doula or epidural, but we want to meet your pain goals."   1.Was your pain managed to your expectations on prior hospitalizations?   Yes   2.What is your expectation for pain management during this hospitalization?     Epidural possibly  3.How can we help you reach that goal? Epidural if desired  Record the patient's initial score and the patient's pain goal.   Pain: 0  Pain Goal: 5 [facility] wants you to be able to say your pain was always managed very well.  Versia Mignogna 04/15/2017

## 2017-04-15 NOTE — Anesthesia Preprocedure Evaluation (Signed)
Anesthesia Evaluation  Patient identified by MRN, date of birth, ID band Patient awake    Reviewed: Allergy & Precautions, H&P , Patient's Chart, lab work & pertinent test results  Airway Mallampati: II  TM Distance: >3 FB Neck ROM: full    Dental  (+) Teeth Intact   Pulmonary former smoker,    breath sounds clear to auscultation       Cardiovascular  Rhythm:regular Rate:Normal     Neuro/Psych    GI/Hepatic   Endo/Other  Morbid obesity  Renal/GU      Musculoskeletal   Abdominal   Peds  Hematology   Anesthesia Other Findings       Reproductive/Obstetrics (+) Pregnancy                            Anesthesia Physical Anesthesia Plan  ASA: III  Anesthesia Plan: Epidural   Post-op Pain Management:    Induction:   PONV Risk Score and Plan:   Airway Management Planned:   Additional Equipment:   Intra-op Plan:   Post-operative Plan:   Informed Consent: I have reviewed the patients History and Physical, chart, labs and discussed the procedure including the risks, benefits and alternatives for the proposed anesthesia with the patient or authorized representative who has indicated his/her understanding and acceptance.   Dental Advisory Given  Plan Discussed with:   Anesthesia Plan Comments: (Labs checked- platelets confirmed with RN in room. Fetal heart tracing, per RN, reported to be stable enough for sitting procedure. Discussed epidural, and patient consents to the procedure:  included risk of possible headache,backache, failed block, allergic reaction, and nerve injury. This patient was asked if she had any questions or concerns before the procedure started.)        Anesthesia Quick Evaluation  

## 2017-04-15 NOTE — Anesthesia Preprocedure Evaluation (Signed)
Anesthesia Evaluation  Patient identified by MRN, date of birth, ID band Patient awake    Reviewed: Allergy & Precautions, H&P , Patient's Chart, lab work & pertinent test results  Airway Mallampati: II  TM Distance: >3 FB Neck ROM: full    Dental  (+) Teeth Intact   Pulmonary former smoker,    breath sounds clear to auscultation       Cardiovascular  Rhythm:regular Rate:Normal     Neuro/Psych    GI/Hepatic   Endo/Other  Morbid obesity  Renal/GU      Musculoskeletal   Abdominal   Peds  Hematology   Anesthesia Other Findings       Reproductive/Obstetrics                             Anesthesia Physical  Anesthesia Plan  ASA: III  Anesthesia Plan: Epidural   Post-op Pain Management:    Induction:   PONV Risk Score and Plan: 2 and Treatment may vary due to age or medical condition  Airway Management Planned: Natural Airway  Additional Equipment:   Intra-op Plan:   Post-operative Plan:   Informed Consent: I have reviewed the patients History and Physical, chart, labs and discussed the procedure including the risks, benefits and alternatives for the proposed anesthesia with the patient or authorized representative who has indicated his/her understanding and acceptance.   Dental Advisory Given  Plan Discussed with:   Anesthesia Plan Comments:         Anesthesia Quick Evaluation

## 2017-04-15 NOTE — H&P (Signed)
OBSTETRIC ADMISSION HISTORY AND PHYSICAL  Alisha MeadDeshontala [name] is a 27 y.o. female [phone] with IUP at 559w5d by 7 week u/s presenting for spontaneous rupture of membranes. She came to [facility] for contractions and ruptured while she was there with light meconium stained fluid. She reports +FMs, No LOF, no VB, no blurry vision, headaches or peripheral edema, and RUQ pain.  She plans on breast feeding. She request BTL for birth control, signed papers on 02/26/17.  She has a history of HSV, on valtrex for suppression. She states she has never had an outbreak.  [facility]   Dating: By 7 week u/s --->  Estimated Date of Delivery: 04/17/17  [facility] Labs  Dating  7 week US Blood type: O/Positive/-- (10/30 1110)   Genetic Screen 1 Screen:too late   AFP: NA     Quad:     NIPS: Antibody:Negative (10/30 1110)  Anatomic US  female; limited views f/u 6wks Rubella: 3.60 (10/30 1110)  GTT Early:               Third trimester:  RPR: Non Reactive (10/30 1110)   Flu vaccine  declines  HBsAg: Negative (10/30 1110)   TDaP vaccine  01-15-17                Rhogam: NA, O+ HIV:   NR  Baby Food  breast                                           GBS: (For PCN allergy, check sensitivities): NEG  Contraception  BTL (signed 02/26/17) Pap: 07/04/15, NIL  Circumcision  if boy, yes, outpatient    [facility]ediatric[facility]  CF:  Support Person  Marcus (FOB) [facility]  Prenatal Classes  4th baby Hgb electrophoresis:   Prenatal History/Complications:  Past Medical History: Past Medical History:  Diagnosis Date  . Asthma   . Chlamydia 2011  . Gonorrhea 2011  . 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 Vaginal Pap smear, abnormal     Past Surgical History: Past Surgical History:  Procedure Laterality Date  . KNEE SURGERY  2007    Obstetrical History: OB History    Gravida  5   Para  3   Term  3   Preterm  0   AB  1   Living  3     SAB  1   TAB  0   Ectopic  0   Multiple  0    Live Births  3           Social History: Social History   Socioeconomic History  . Marital status: Single    Spouse name: Not on file  . Number of children: 1  . Years of education: College  . Highest education level: Not on file  Occupational History    Comment: n/a  Social Needs  . Financial resource strain: Not on file  . Food insecurity:    Worry: Not on file    Inability: Not on file  . Transportation needs:    Medical: Not on file    Non-medical: Not on file  Tobacco Use  . Smoking status: Former Smoker    Packs/day: 0.25    Years: 5.00    Pack years: 1.25    Types: Cigarettes    Last attempt  to quit: 10/17/2014    Years since quitting: 2.4  . Smokeless tobacco: Never Used  Substance and Sexual Activity  . Alcohol use: No    Comment: Quit: 03/17/12  . Drug use: No  . Sexual activity: Yes    Birth control/protection: None    Comment: not in last week  Lifestyle  . Physical activity:    Days per week: Not on file    Minutes per session: Not on file  . Stress: Not on file  Relationships  . Social connections:    Talks on phone: Not on file    Gets together: Not on file    Attends religious service: Not on file    Active member of club or organization: Not on file    Attends meetings of clubs or organizations: Not on file    Relationship status: Not on file  Other Topics Concern  . Not on file  Social History Narrative   Patient lives at home with her family   Caffeine Use: medication    Family History: Family History  Problem Relation Age of Onset  . Diabetes Maternal Grandmother   . Hypertension Maternal Grandmother   . Anesthesia problems Neg Hx     Allergies: No Known Allergies  Medications Prior to Admission  Medication Sig Dispense Refill Last Dose  . aspirin EC 81 MG tablet Take 1 tablet (81 mg total) by mouth daily. 30 tablet 11 04/14/2017 at Unknown time  . famotidine (PEPCID) 20 MG tablet Take 1 tablet (20 mg total) by mouth 2  (two) times daily. 60 tablet 2 Past Week at Unknown time  . Prenatal MV-Min-Fe Cbn-FA-DHA (PRENATAL PLUS DHA) 7-0.4-100 MG TABS Take 1 tablet by mouth daily. 60 each 5 Past Week at Unknown time  . valACYclovir (VALTREX) 500 MG tablet Take 1 tablet (500 mg total) by mouth 2 (two) times daily. 60 tablet 1 04/14/2017 at Unknown time   Review of Systems   All systems reviewed and negative except as stated in HPI  Blood pressure 136/82, pulse 84, temperature 97.9 F (36.6 C), temperature source Oral, resp. rate 20, height 5\' 7"  (1.702 m), weight (!) 318 lb (144.2 kg), last menstrual period 07/11/2016, unknown if currently breastfeeding. General appearance: alert, cooperative and no distress Lungs: clear to auscultation bilaterally Heart: regular rate and rhythm Abdomen: soft, non-tender; bowel sounds normal Pelvic: n/a Extremities: Homans sign is negative, no sign of DVT DTR's +2 Presentation: cephalic Fetal monitoringBaseline: 120 bpm, Variability: Good {> 6 bpm), Accelerations: Reactive and Decelerations: Absent Uterine activityFrequency: Every 4-6 minutes Dilation: 3.5 Effacement (%): 70 Exam by:: Caprice Renshaw RN   Prenatal labs: ABO, Rh: O/Positive/-- (10/30 1110) Antibody: Negative (10/30 1110) Rubella: 3.60 (10/30 1110) RPR: Non Reactive (12/26 0834)  HBsAg: Negative (10/30 1110)  HIV: Non Reactive (12/26 0834)  GBS:   Negative  Prenatal Transfer Tool  Maternal Diabetes: No Genetic Screening: Normal Maternal Ultrasounds/Referrals: Normal Fetal Ultrasounds or other Referrals:  None Maternal Substance Abuse:  No Significant Maternal Medications:  None Significant Maternal Lab Results: Lab values include: Group B Strep negative  Results for orders placed or performed during the hospital encounter of 04/15/17 (from the past 24 hour(s))  Fern Test   Collection Time: 04/15/17  6:26 AM  Result Value Ref Range   POCT Fern Test Positive = ruptured amniotic membanes      Patient Active Problem List   Diagnosis Date Noted  . Unwanted fertility 03/25/2017  . Supervision of other normal pregnancy,  antepartum 11/19/2016  . 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 Morbid obesity with BMI of 45.0-49.9, adult (HCC) 03/25/2011    Assessment/Plan:  [name] is a 27 y.o. [phone] at [redacted]w[redacted]d here for spontaneous rupture of membranes  #Labor: Expectant management #Pain: Per patient request #FWB: Cat 1 #ID:  GBS neg #MOF: Breast #MOC: BTL, paper signed 02/26/17 #Circ:  outpatient  Rolm Bookbinder, CNM  04/15/2017, 6:47 AM

## 2017-04-15 NOTE — Anesthesia Procedure Notes (Signed)
Epidural Patient location during procedure: OB Start time: 04/15/2017 10:58 AM End time: 04/15/2017 11:14 AM  Staffing Anesthesiologist: Lowella CurbMiller, Maizee Reinhold Ray, MD Performed: anesthesiologist   Preanesthetic Checklist Completed: patient identified, site marked, surgical consent, pre-op evaluation, timeout performed, IV checked, risks and benefits discussed and monitors and equipment checked  Epidural Patient position: sitting Prep: ChloraPrep Patient monitoring: heart rate, cardiac monitor, continuous pulse ox and blood pressure Approach: midline Location: L2-L3 Injection technique: LOR saline  Needle:  Needle type: Tuohy  Needle gauge: 17 G Needle length: 9 cm Needle insertion depth: 7 cm Catheter type: closed end flexible Catheter size: 20 Guage Catheter at skin depth: 12 cm Test dose: negative  Assessment Events: blood not aspirated, injection not painful, no injection resistance, negative IV test and no paresthesia  Additional Notes Reason for block:procedure for pain

## 2017-04-15 NOTE — Op Note (Signed)
Alisha CairoDeshontala [name] 04/15/2017  PREOPERATIVE DIAGNOSIS:  Undesired fertility  POSTOPERATIVE DIAGNOSIS:  Undesired fertility  PROCEDURE:  Postpartum Bilateral Tubal Sterilization using Filshie Clips   SURGEON:  Dr Candelaria CelesteJacob Rosell Khouri  ANESTHESIA:  Epidural  COMPLICATIONS:  None immediate.  ESTIMATED BLOOD LOSS:  Less than 20cc.  FLUIDS: 500 mL LR.  URINE OUTPUT:  500 mL of clear urine.  INDICATIONS: 27 y.o. yo L2G4010G5P4014  with undesired fertility,status post vaginal delivery, desires permanent sterilization. Risks and benefits of procedure discussed with patient including permanence of method, bleeding, infection, injury to surrounding organs and need for additional procedures. Risk failure of 0.5-1% with increased risk of ectopic gestation if pregnancy occurs was also discussed with patient.   FINDINGS:  Normal uterus, tubes, and ovaries.  TECHNIQUE:  The patient was taken to the operating room where her epidural anesthesia was dosed up to surgical level and found to be adequate.  She was then placed in the dorsal supine position and prepped and draped in sterile fashion.  After an adequate timeout was performed, attention was turned to the patient's abdomen where a small transverse skin incision was made under the umbilical fold. The incision was taken down to the layer of fascia using the scalpel, and fascia was incised, and extended bilaterally using Mayo scissors. The peritoneum was entered in a sharp fashion. The patient's right fallopian tube was then identified and grasped with a Babcock clamp. The tube was then followed out to the fimbria. The Babcock clamp was then used to grasp the tube approximately 4 cm from the cornual region. A 3 cm segment of the tube was then ligated with free tie of plain gut suture, transected and excised. Good hemostasis was noted and the tube was returned to the abdomen. The left fallopian tube was then identified to its fimbriated end, ligated, and a 3 cm segment  excised in a similar fashion. Excellent hemostasis was noted, and the tube returned to the abdomen. Good hemostasis was noted overall.  Local analgesia was drizzled on both operative sites.The instruments were then removed from the patient's abdomen and the fascial incision was repaired with 0 Vicryl, and the skin was closed with a 3-0 Monocryl subcuticular stitch. The patient tolerated the procedure well.  Sponge, lap, and needle counts were correct times two.  The patient was then taken to the recovery room awake, extubated and in stable condition.   Levie HeritageStinson, Armani Brar J, DO 04/15/2017 4:01 PM

## 2017-04-15 NOTE — Progress Notes (Signed)
Patient ID: Alisha CairoDeshontala Soileau, female   DOB: [birth year], 27 y.o.   MRN: [id number]  Risks of procedure discussed with patient including but not limited to: risk of regret, permanence of method, bleeding, infection, injury to surrounding organs and need for additional procedures.  Failure risk of 1 -2 % with increased risk of ectopic gestation if pregnancy occurs was also discussed with patient.    Levie HeritageStinson, Jacob J, DO 04/15/2017 3:04 PM

## 2017-04-15 NOTE — Transfer of Care (Signed)
Immediate Anesthesia Transfer of Care Note  Patient: Alisha CairoDeshontala [name]  Procedure(s) Performed: POST PARTUM TUBAL LIGATION (N/A )  Patient Location: PACU  Anesthesia Type:Epidural  Level of Consciousness: awake, alert  and oriented  Airway & Oxygen Therapy: Patient Spontanous Breathing  Post-op Assessment: Report given to RN and Post -op Vital signs reviewed and stable  Post vital signs: Reviewed and stable  Last Vitals:  Vitals Value Taken Time  BP 123/69 04/15/2017  4:15 PM  Temp    Pulse 87 04/15/2017  4:26 PM  Resp 24 04/15/2017  4:26 PM  SpO2 98 % 04/15/2017  4:26 PM  Vitals shown include unvalidated device data.  Last Pain:  Vitals:   04/15/17 1615  TempSrc:   PainSc: Asleep         Complications: No apparent anesthesia complications

## 2017-04-15 NOTE — Progress Notes (Signed)
While completing assessment and obtaining VS, dinamap showed HR=164, pt asymptomatic.  HR then went down to 80.  BP wnl 130/70, Sats 99%.  About 5 min later, pt began to c/o CP (denies SOB, palpitations, nausea, abdominal pain) and stated it felt "like a pulled muscle".  Teaching Service MD Adrian Blackwater(Stinson) was notified but CP had resolved by the time MD had arrived to mother baby unit.    Pt now resting in bed, with infant skin to skin (after first bath).  Pt agrees to notify RN if she begins to have any further CP.  Pt did state that on occasion she has these "small bouts of CP" at home, but it does not happen very often.    Will continue to monitor.

## 2017-04-15 NOTE — Anesthesia Postprocedure Evaluation (Signed)
Anesthesia Post Note  Patient: Alisha CairoDeshontala Gawron  Procedure(s) Performed: AN AD HOC LABOR EPIDURAL     Patient location during evaluation: Mother Baby Anesthesia Type: Epidural Level of consciousness: awake and alert and oriented Pain management: satisfactory to patient Vital Signs Assessment: post-procedure vital signs reviewed and stable Respiratory status: spontaneous breathing and nonlabored ventilation Cardiovascular status: stable Postop Assessment: no headache, no backache, no signs of nausea or vomiting, adequate PO intake and patient able to bend at knees (patient up walking) Anesthetic complications: no    Last Vitals:  Vitals:   04/15/17 1606 04/15/17 1615  BP: 122/70 123/69  Pulse: 83 83  Resp: 20 20  Temp: 36.6 C   SpO2:  99%    Last Pain:  Vitals:   04/15/17 1615  TempSrc:   PainSc: Asleep   Pain Goal:                 Madison HickmanGREGORY,Laterrica Libman

## 2017-04-16 ENCOUNTER — Encounter: Payer: Medicaid Other | Admitting: Advanced Practice Midwife

## 2017-04-16 LAB — CBC
HEMATOCRIT: 33 % — AB (ref 36.0–46.0)
HEMOGLOBIN: 11 g/dL — AB (ref 12.0–15.0)
MCH: 24.3 pg — AB (ref 26.0–34.0)
MCHC: 33.3 g/dL (ref 30.0–36.0)
MCV: 72.8 fL — ABNORMAL LOW (ref 78.0–100.0)
Platelets: 302 10*3/uL (ref 150–400)
RBC: 4.53 MIL/uL (ref 3.87–5.11)
RDW: 17.8 % — ABNORMAL HIGH (ref 11.5–15.5)
WBC: 14.6 10*3/uL — ABNORMAL HIGH (ref 4.0–10.5)

## 2017-04-16 LAB — RPR: RPR Ser Ql: NONREACTIVE

## 2017-04-16 MED ORDER — SENNOSIDES-DOCUSATE SODIUM 8.6-50 MG PO TABS: 2.0000 | ORAL_TABLET | ORAL | 1 refills | Status: DC

## 2017-04-16 MED ORDER — IBUPROFEN 600 MG PO TABS: 600.0000 mg | ORAL_TABLET | Freq: Four times a day (QID) | ORAL | 0 refills | Status: DC

## 2017-04-16 MED ORDER — OXYCODONE-ACETAMINOPHEN 5-325 MG PO TABS: 1.0000 | ORAL_TABLET | ORAL | 0 refills | Status: DC | PRN

## 2017-04-16 NOTE — Discharge Instructions (Signed)
Vaginal Delivery, Care After °Refer to this sheet in the next few weeks. These instructions provide you with information about caring for yourself after vaginal delivery. Your health care provider may also give you more specific instructions. Your treatment has been planned according to current medical practices, but problems sometimes occur. Call your health care provider if you have any problems or questions. °What can I expect after the procedure? °After vaginal delivery, it is common to have: °· Some bleeding from your vagina. °· Soreness in your abdomen, your vagina, and the area of skin between your vaginal opening and your anus (perineum). °· Pelvic cramps. °· Fatigue. ° °Follow these instructions at home: °Medicines °· Take over-the-counter and prescription medicines only as told by your health care provider. °· If you were prescribed an antibiotic medicine, take it as told by your health care provider. Do not stop taking the antibiotic until it is finished. °Driving ° °· Do not drive or operate heavy machinery while taking prescription pain medicine. °· Do not drive for 24 hours if you received a sedative. °Lifestyle °· Do not drink alcohol. This is especially important if you are breastfeeding or taking medicine to relieve pain. °· Do not use tobacco products, including cigarettes, chewing tobacco, or e-cigarettes. If you need help quitting, ask your health care provider. °Eating and drinking °· Drink at least 8 eight-ounce glasses of water every day unless you are told not to by your health care provider. If you choose to breastfeed your baby, you may need to drink more water than this. °· Eat high-fiber foods every day. These foods may help prevent or relieve constipation. High-fiber foods include: °? Whole grain cereals and breads. °? Brown rice. °? Beans. °? Fresh fruits and vegetables. °Activity °· Return to your normal activities as told by your health care provider. Ask your health care provider  what activities are safe for you. °· Rest as much as possible. Try to rest or take a nap when your baby is sleeping. °· Do not lift anything that is heavier than your baby or 10 lb (4.5 kg) until your health care provider says that it is safe. °· Talk with your health care provider about when you can engage in sexual activity. This may depend on your: °? Risk of infection. °? Rate of healing. °? Comfort and desire to engage in sexual activity. °Vaginal Care °· If you have an episiotomy or a vaginal tear, check the area every day for signs of infection. Check for: °? More redness, swelling, or pain. °? More fluid or blood. °? Warmth. °? Pus or a bad smell. °· Do not use tampons or douches until your health care provider says this is safe. °· Watch for any blood clots that may pass from your vagina. These may look like clumps of dark red, brown, or black discharge. °General instructions °· Keep your perineum clean and dry as told by your health care provider. °· Wear loose, comfortable clothing. °· Wipe from front to back when you use the toilet. °· Ask your health care provider if you can shower or take a bath. If you had an episiotomy or a perineal tear during labor and delivery, your health care provider may tell you not to take baths for a certain length of time. °· Wear a bra that supports your breasts and fits you well. °· If possible, have someone help you with household activities and help care for your baby for at least a few days after   you leave the hospital. °· Keep all follow-up visits for you and your baby as told by your health care provider. This is important. °Contact a health care provider if: °· You have: °? Vaginal discharge that has a bad smell. °? Difficulty urinating. °? Pain when urinating. °? A sudden increase or decrease in the frequency of your bowel movements. °? More redness, swelling, or pain around your episiotomy or vaginal tear. °? More fluid or blood coming from your episiotomy or  vaginal tear. °? Pus or a bad smell coming from your episiotomy or vaginal tear. °? A fever. °? A rash. °? Little or no interest in activities you used to enjoy. °? Questions about caring for yourself or your baby. °· Your episiotomy or vaginal tear feels warm to the touch. °· Your episiotomy or vaginal tear is separating or does not appear to be healing. °· Your breasts are painful, hard, or turn red. °· You feel unusually sad or worried. °· You feel nauseous or you vomit. °· You pass large blood clots from your vagina. If you pass a blood clot from your vagina, save it to show to your health care provider. Do not flush blood clots down the toilet without having your health care provider look at them. °· You urinate more than usual. °· You are dizzy or light-headed. °· You have not breastfed at all and you have not had a menstrual period for 12 weeks after delivery. °· You have stopped breastfeeding and you have not had a menstrual period for 12 weeks after you stopped breastfeeding. °Get help right away if: °· You have: °? Pain that does not go away or does not get better with medicine. °? Chest pain. °? Difficulty breathing. °? Blurred vision or spots in your vision. °? Thoughts about hurting yourself or your baby. °· You develop pain in your abdomen or in one of your legs. °· You develop a severe headache. °· You faint. °· You bleed from your vagina so much that you fill two sanitary pads in one hour. °This information is not intended to replace advice given to you by your health care provider. Make sure you discuss any questions you have with your health care provider. °Document Released: 01/05/2000 Document Revised: 06/21/2015 Document Reviewed: 01/22/2015 °Elsevier Interactive Patient Education © 2018 [employer] Inc[employer] ° °

## 2017-04-16 NOTE — Discharge Summary (Signed)
OB Discharge Summary     Patient Name: [name] DOB: [birth year] MRN: [id number]  Date of admission: 04/15/2017 Delivering MD: Chrisandra Netters   Date of discharge: 04/16/2017  Admitting diagnosis: 39.3 WEEKS CTX BLOODY MUCUS PRESSURE Intrauterine pregnancy: [redacted]w[redacted]d     Secondary diagnosis:  Active Problems:   Normal labor and delivery  Additional problems: none     Discharge diagnosis: Term Pregnancy Delivered                                                                                                Post partum procedures:postpartum tubal ligation  Augmentation: Pitocin  Complications: None  Hospital course:  Onset of Labor With Vaginal Delivery     27 y.o. yo [id number] at [redacted]w[redacted]d was admitted in Latent Labor on 04/15/2017. Patient had an uncomplicated labor course as follows:  Membrane Rupture Time/Date: 6:25 AM ,04/15/2017   Intrapartum Procedures: Episiotomy: None [1]                                         Lacerations:  None [1]  Patient had a delivery of a Viable infant. 04/15/2017  Information for the patient's newborn:  Alisha, [name] [[id number]]  Delivery Method: Vaginal, Spontaneous(Filed from Delivery Summary)    [name] had an uncomplicated postpartum course.  She is ambulating, tolerating a regular diet, passing flatus, and urinating well. Patient is discharged home in stable condition on 04/16/17.   Physical exam  Vitals:   04/15/17 1813 04/15/17 2200 04/16/17 0330 04/16/17 0626  BP: 130/85 130/70 127/76 134/84  Pulse: 71 82 65 79  Resp: 17 18 18 18   Temp: 97.9 F (36.6 C) 98.3 F (36.8 C)    TempSrc:  Oral Oral   SpO2: 100% 99% 100% 100%  Weight:      Height:       General: alert, cooperative and no distress Lochia: appropriate Uterine Fundus: firm Incision: Healing well with no significant drainage, No significant erythema, Dressing is clean, dry, and intact DVT Evaluation: No evidence of DVT seen on physical exam. Negative  Homan's sign. No cords or calf tenderness. No significant calf/ankle edema. Labs: Lab Results  Component Value Date   WBC 14.6 (H) 04/16/2017   HGB 11.0 (L) 04/16/2017   HCT 33.0 (L) 04/16/2017   MCV 72.8 (L) 04/16/2017   PLT 302 04/16/2017   CMP Latest Ref Rng & Units 05/01/2014  Glucose 70 - 99 mg/dL 88  BUN 6 - 23 mg/dL 15  Creatinine 1.30 - 8.65 mg/dL 7.84  Sodium 696 - 295 mmol/L 139  Potassium 3.5 - 5.1 mmol/L 3.8  Chloride 96 - 112 mmol/L 108  CO2 19 - 32 mmol/L 25  Calcium 8.4 - 10.5 mg/dL 8.6  Total Protein 6.0 - 8.3 g/dL 6.3  Total Bilirubin 0.3 - 1.2 mg/dL 0.3  Alkaline Phos 39 - 117 U/L 72  AST 0 - 37 U/L 15  ALT 0 - 35 U/L 14    Discharge instruction: per After  Visit Summary and "Baby and Me Booklet".  After visit meds:  Allergies as of 04/16/2017   No Known Allergies     Medication List    STOP taking these medications   aspirin EC 81 MG tablet   famotidine 20 MG tablet Commonly known as:  PEPCID   valACYclovir 500 MG tablet Commonly known as:  VALTREX     TAKE these medications   ibuprofen 600 MG tablet Commonly known as:  ADVIL,MOTRIN Take 1 tablet (600 mg total) by mouth every 6 (six) hours.   oxyCODONE-acetaminophen 5-325 MG tablet Commonly known as:  PERCOCET/ROXICET Take 1 tablet by mouth every 4 (four) hours as needed (pain scale 4-7).   PRENATAL PLUS DHA 7-0.4-100 MG Tabs Take 1 tablet by mouth daily.   senna-docusate 8.6-50 MG tablet Commonly known as:  Senokot-S Take 2 tablets by mouth daily. Start taking on:  04/17/2017       Diet: routine diet  Activity: Advance as tolerated. Pelvic rest for 6 weeks.   Outpatient follow up:4 weeks Follow up Appt:No future appointments. Follow up Visit:No follow-ups on file.  Postpartum contraception: Tubal Ligation  Newborn Data: Live born female  Birth Weight: 6 lb 3.3 oz (2815 g) APGAR: 9, 9  Newborn Delivery   Birth date/time:  04/15/2017 13:32:00 Delivery type:  Vaginal,  Spontaneous     Baby Feeding: Breast Disposition:home with mother   04/16/2017 Alisha HeritageJacob [name] Alisha Kontz, DO

## 2017-04-16 NOTE — Anesthesia Postprocedure Evaluation (Signed)
Anesthesia Post Note  Patient: [name]  Procedure(s) Performed: POST PARTUM TUBAL LIGATION (N/A )     Patient location during evaluation: Mother Baby Anesthesia Type: Epidural Level of consciousness: awake Pain management: pain level controlled Vital Signs Assessment: post-procedure vital signs reviewed and stable Respiratory status: spontaneous breathing Cardiovascular status: stable Postop Assessment: no headache, no backache, epidural receding, patient able to bend at knees, no apparent nausea or vomiting and adequate PO intake Anesthetic complications: no    Last Vitals:  Vitals:   04/16/17 0330 04/16/17 0626  BP: 127/76 134/84  Pulse: 65 79  Resp: 18 18  Temp:    SpO2: 100% 100%    Last Pain:  Vitals:   04/16/17 0626  TempSrc:   PainSc: 3    Pain Goal:                 Velena Keegan

## 2017-04-16 NOTE — Addendum Note (Signed)
Addendum  created 04/16/17 0744 by Renford DillsMullins, Shin Lamour L, CRNA   Sign clinical note

## 2017-04-16 NOTE — Anesthesia Postprocedure Evaluation (Signed)
Anesthesia Post Note  Patient: [name]  Procedure(s) Performed: POST PARTUM TUBAL LIGATION (N/A )     Anesthesia Post Evaluation  Last Vitals:  Vitals:   04/16/17 0330 04/16/17 0626  BP: 127/76 134/84  Pulse: 65 79  Resp: 18 18  Temp:    SpO2: 100% 100%    Last Pain:  Vitals:   04/16/17 0626  TempSrc:   PainSc: 3                  Lowella CurbWarren Ray [name]

## 2017-04-16 NOTE — Progress Notes (Signed)
POSTPARTUM PROGRESS NOTE  Post Partum Day 1  Subjective:  [name] is a 27 y.o. [id number] 5578w5d s/p NSVD @ 1333 followed by BTL at 1600.  No acute events overnight.  Pt denies problems with ambulating, voiding or po intake.  She denies nausea or vomiting.  Pain is well controlled. Exclusively breast feeding. Lochia Moderate.   Objective: Blood pressure 134/84, pulse 79, temperature 98.3 F (36.8 C), temperature source Oral, resp. rate 18, height 5\' 7"  (1.702 m), weight (!) 144.2 kg (318 lb), last menstrual period 07/11/2016, SpO2 100 %, unknown if currently breastfeeding.  Physical Exam:  General: alert, cooperative and no distress Lochia:normal flow Chest: no respiratory distress Heart:regular rate, distal pulses intact Abdomen: soft, nontender,  Uterine Fundus: firm, appropriately tender DVT Evaluation: No calf swelling or tenderness Extremities: no edema  Recent Labs    04/15/17 0645 04/16/17 0520  HGB 11.7* 11.0*  HCT 34.8* 33.0*    Assessment/Plan:  ASSESSMENT: [name] is a 27 y.o. [id number] 6078w5d s/p NSVD PPD#1 and BTL POD#1. Overall pt is doing well. Intermittent RLQ pain with movement. Able to perform ADL's without limitations. Awaiting newborn 24 hours screening before D/C to home.  Possible D/C home today.   LOS: 1 day   Anitra LauthAaron A Elex Mainwaring PA-Student 04/16/2017, 9:15 AM

## 2017-04-16 NOTE — Lactation Note (Signed)
This note was copied from a baby's chart. Lactation Consultation Note Baby 15 hrs old. Wt. 6.3 lbs. Discussed w/mom about baby wt. And probability of loosing down below 6 lbs before d/c home. Discussed supplementing, mom states she has colostrum. Encouraged mom to hand expression and spoon feeding colostrum. encouraged mom to use DEBP or hand pump, mom stated no thank you, she didn't like the pumps. Discussed needing extra stimulation and supplementation. Mom stated she can hand express colostrum. Mom stated baby has been feeding well. Mom encouraged to feed baby 8-12 times/24 hours and with feeding cues.  FOB laying on couch looking at phone, turned music video up very loud to were mom couldn't hear me. LC stopped talking, waiting on FOB to turn music down thinking it was a mistake, and no it wasn't a mistake, he didn't turn it down and continued to watch video. Mom didn't say anything to him asking him to turn it down. LC told mom to call if she needed anything. Mom has large soft breast w/short shaft nipples. Mom latched baby in football position. Made a few suggestions, mom had baby swaddled, encouraged STS, and good support while feeding. [facility] brochure given w/resources, support groups and LC services.  Patient Name: Alisha Jerrilyn CairoDeshontala Steinhardt UEAVW'UToday's Date: 04/16/2017 Reason for consult: Initial assessment   Maternal Data Has patient been taught Hand Expression?: Yes Does the patient have breastfeeding experience prior to this delivery?: Yes  Feeding Feeding Type: Breast Fed Length of feed: 5 min(still BF)  LATCH Score Latch: Repeated attempts needed to sustain latch, nipple held in mouth throughout feeding, stimulation needed to elicit sucking reflex.  Audible Swallowing: A few with stimulation  Type of Nipple: Everted at rest and after stimulation(short shaft)  Comfort (Breast/Nipple): Soft / non-tender  Hold (Positioning): Assistance needed to correctly position infant at breast  and maintain latch.  LATCH Score: 7  Interventions Interventions: Breast feeding basics reviewed;Support pillows;Assisted with latch;Position options;Skin to skin;Breast massage;Hand express;Breast compression;Adjust position  Lactation Tools Discussed/Used WIC Program: Yes   Consult Status Consult Status: Follow-up Date: 04/17/17 Follow-up type: In-patient    Charyl DancerCARVER, Camika Marsico G 04/16/2017, 4:53 AM

## 2017-05-01 ENCOUNTER — Encounter (HOSPITAL_COMMUNITY): Payer: Self-pay

## 2017-05-28 ENCOUNTER — Encounter: Payer: Self-pay | Admitting: Medical

## 2017-05-28 ENCOUNTER — Ambulatory Visit (INDEPENDENT_AMBULATORY_CARE_PROVIDER_SITE_OTHER): Payer: Medicaid Other | Admitting: Medical

## 2017-05-28 ENCOUNTER — Ambulatory Visit (INDEPENDENT_AMBULATORY_CARE_PROVIDER_SITE_OTHER): Payer: Medicaid Other | Admitting: Clinical

## 2017-05-28 DIAGNOSIS — O99345 Other mental disorders complicating the puerperium: Secondary | ICD-10-CM

## 2017-05-28 DIAGNOSIS — F4323 Adjustment disorder with mixed anxiety and depressed mood: Secondary | ICD-10-CM

## 2017-05-28 DIAGNOSIS — F53 Postpartum depression: Secondary | ICD-10-CM

## 2017-05-28 NOTE — Progress Notes (Signed)
Subjective:     [name] is a 27 y.o. female who presents for a postpartum visit. She is 5 weeks postpartum following a spontaneous vaginal delivery. I have fully reviewed the prenatal and intrapartum course. The delivery was at 39 gestational weeks. Outcome: spontaneous vaginal delivery. Anesthesia: epidural. Postpartum course has been complicated by depression symptoms. Per patient, baby's pediatrician felt she may need medication. Patient agreeable to speak with [facility] today prior to deciding about medication management. Baby's course has been uncomplicated. Baby is feeding by breast. Bleeding- none Bowel function is normal. Bladder function is normal. Patient is not sexually active. Contraception method is tubal ligation. Postpartum depression screening: positive.  The following portions of the patient's history were reviewed and updated as appropriate: allergies, current medications, past family history, past medical history, past social history, past surgical history and problem list.  Review of Systems Pertinent items are noted in HPI.   Objective:    BP 104/62   Pulse 81   Ht  (1.702 m)   Wt 291 lb 1.6 oz (132 kg)   LMP 05/24/2017   BMI 45.59 kg/m   General:  alert and cooperative   Breasts:  deferred, no concerns  Lungs: clear to auscultation bilaterally  Heart:  regular rate and rhythm, S1, S2 normal, no murmur, click, rub or gallop  Abdomen: soft, non-tender; bowel sounds normal; no masses,  no organomegaly   Vulva:  not evaluated  Vagina: not evaluated  Cervix:  no evaluated  Corpus: not examined  Adnexa:  not evaluated  Rectal Exam: Not performed.        Assessment:     Normal postpartum exam. Pap smear not done at today's visit. Last pap smear was normal 06/2015. Possible PP depression, mild   Plan:    1. Contraception: tubal ligation 2. [facility] consult today 3. Follow up in: 1 year for annual exam or sooner as needed.    Marny Lowenstein, PA-C 05/28/2017  3:14 PM

## 2017-05-28 NOTE — Patient Instructions (Signed)
Postpartum Tubal Ligation, Care After Refer to this sheet in the next few weeks. These instructions provide you with information about caring for yourself after your procedure. Your health care provider may also give you more specific instructions. Your treatment has been planned according to current medical practices, but problems sometimes occur. Call your health care provider if you have any problems or questions after your procedure. What can I expect after the procedure? After the procedure, it is common to have:  A sore throat.  Bruising or pain in your back.  Nausea or vomiting.  Dizziness.  Mild abdominal discomfort or pain, such as cramping, gas pain, or feeling bloated.  Soreness where the incision was made.  Tiredness.  Pain in your shoulders.  Follow these instructions at home: Medicines  Take over-the-counter and prescription medicines only as told by your health care provider.  Do not take aspirin because it can cause bleeding.  Do not drive or operate heavy machinery while taking prescription pain medicine. Activity  Rest for the rest of the day.  Gradually return to your normal activities over the next few days.  Do not have sex, douche, or put a tampon or anything else in your vagina for 6 weeks or as long as told by your health care provider.  Do not lift anything that is heavier than your baby for 2 weeks or as long as told by your health care provider. Incision care  Follow instructions from your health care provider about how to take care of your incision. Make sure you: ? Wash your hands with soap and water before you change your bandage (dressing). If soap and water are not available, use hand sanitizer. ? Change your dressing as told by your health care provider. ? Leave stitches (sutures) in place. They may need to stay in place for 2 weeks or longer.  Check your incision area every day for signs of infection. Check for: ? More redness, swelling,  or pain. ? More fluid or blood. ? Warmth. ? Pus or a bad smell. Other Instructions  Do not take baths, swim, or use a hot tub until your health care provider approves. You may take showers.  Keep all follow-up visits as told by your health care provider. This is important. Contact a health care provider if:  You have more redness, swelling, or pain around your incision.  Your incision feels warm to the touch.  You have pus or a bad smell coming from your incision.  The edges of your incision break open after the sutures have been removed.  Your pain does not improve after 2-3 days.  You have a rash.  You repeatedly become dizzy or lightheaded.  Your pain medicine is not helping.  You are constipated. Get help right away if:  You have a fever.  You faint.  You have pain in your abdomen that gets worse.  You have fluid or blood coming from your sutures.  You have shortness of breath or difficulty breathing.  You have chest pain or leg pain.  You have ongoing nausea or diarrhea. This information is not intended to replace advice given to you by your health care provider. Make sure you discuss any questions you have with your health care provider. Document Released: 07/09/2011 Document Revised: 06/12/2015 Document Reviewed: 12/18/2014 Elsevier Interactive Patient Education  2018 Elsevier Inc.  

## 2017-05-28 NOTE — BH Specialist Note (Signed)
Integrated Behavioral Health Initial Visit  MRN: [id number] Name: [name]  Number of Integrated Behavioral Health Clinician visits:: 1/6 Session Start time: 3:36  Session End time: 3:59 Total time: 20 minutes  Type of Service: Integrated Behavioral Health- Individual/Family Interpretor:No. Interpretor Name and Language: n/a   Warm Hand Off Completed.       SUBJECTIVE: [name] is a 27 y.o. female accompanied by NB son Patient was referred by Vonzella Nipple, PA-C for positive depression screen. Patient reports the following symptoms/concerns: Pt states her primary symptoms are crying and increased worry and anxiety postpartum, that she did not experience with previous pregnancies; is open to educational material and self-coping strategies today.  Duration of problem: postpartum; Severity of problem: moderate  OBJECTIVE: Mood: Normal and Affect: Appropriate Risk of harm to self or others: No plan to harm self or others  LIFE CONTEXT: Family and Social: Pt lives with her children (NB, 2yo, 47yo, 6yo) School/Work: Maternity leave Self-Care: Recognizing need for greater self-care Life Changes: Recent childbirth  GOALS ADDRESSED: Patient will: 1. Reduce symptoms of: anxiety and depression 2. Increase knowledge and/or ability of: self-management skills  3. Demonstrate ability to: Increase healthy adjustment to current life circumstances and Increase adequate support systems for patient/family  INTERVENTIONS: Interventions utilized: Mindfulness or Management consultant, Psychoeducation and/or Health Education and Link to [employer]  Standardized Assessments completed: Edinburgh Postnatal Depression  ASSESSMENT: Patient currently experiencing Adjustment disorder with mixed anxious and depressed mood.   Patient may benefit from psychoeducation and brief therapeutic interventions regarding coping with symptoms of anxiety and  depression .  PLAN: 1. Follow up with behavioral health clinician on : As requested by pt 2. Behavioral recommendations:  -CALM relaxation breathing exercise every morning at breakfast-time -Attend at least one Mom Talk support group, Tuesdays at 10am, [facility] -Read educational materials regarding coping with symptoms of anxiety and depression  3. Referral(s): Integrated Behavioral Health Services (In Clinic) 4. "From scale of 1-10, how likely are you to follow plan?": 9  Valetta Close [place], LCSW   Edinburgh Postnatal Depression Scale - 05/28/17 1458      Edinburgh Postnatal Depression Scale:  In the Past 7 Days   I have been able to laugh and see the funny side of things.  2    I have looked forward with enjoyment to things.  2    I have blamed myself unnecessarily when things went wrong.  2    I have been anxious or worried for no good reason.  3    I have felt scared or panicky for no good reason.  0    Things have been getting on top of me.  2    I have been so unhappy that I have had difficulty sleeping.  2    I have felt sad or miserable.  2    I have been so unhappy that I have been crying.  2    The thought of harming myself has occurred to me.  0    Edinburgh Postnatal Depression Scale Total  17

## 2017-10-21 IMAGING — US US MFM OB DETAIL+14 WK
2 series · 14 of 28 positions shown · non-contrast
Comparison: none

[Series 1: us mfm ob detail+14 wk · 12 of 81 slices shown (1 of 2)]
[im 4/81]
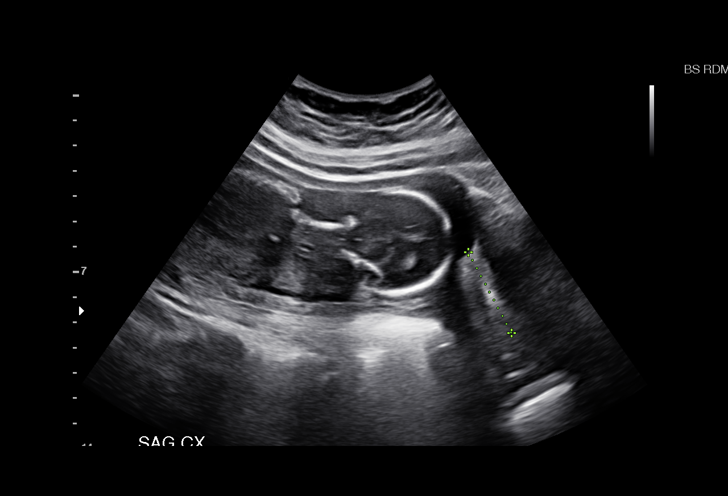
[im 11/81]
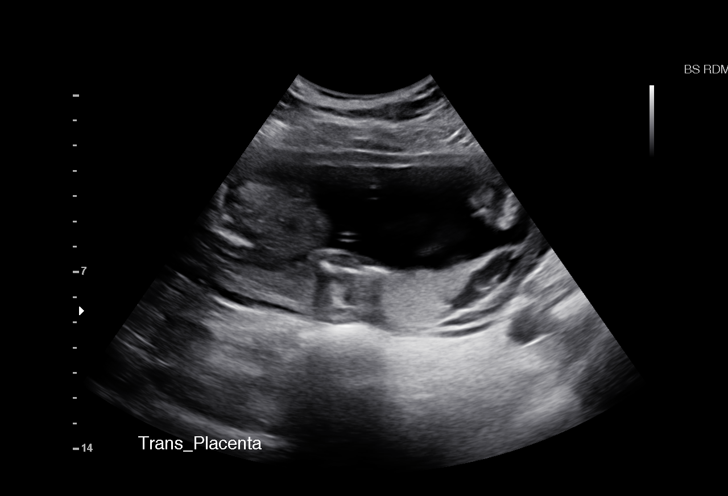
[im 18/81]
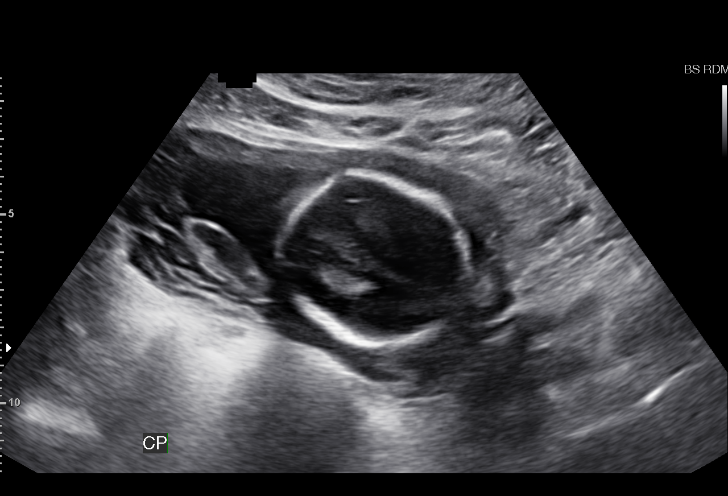
[im 25/81]
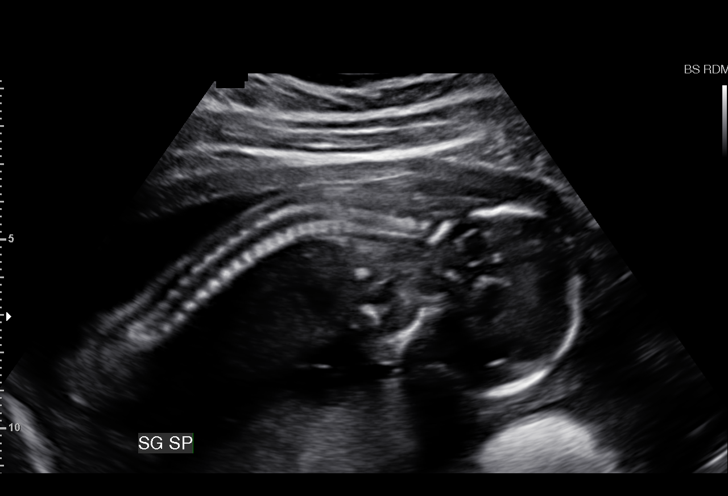
[im 32/81]
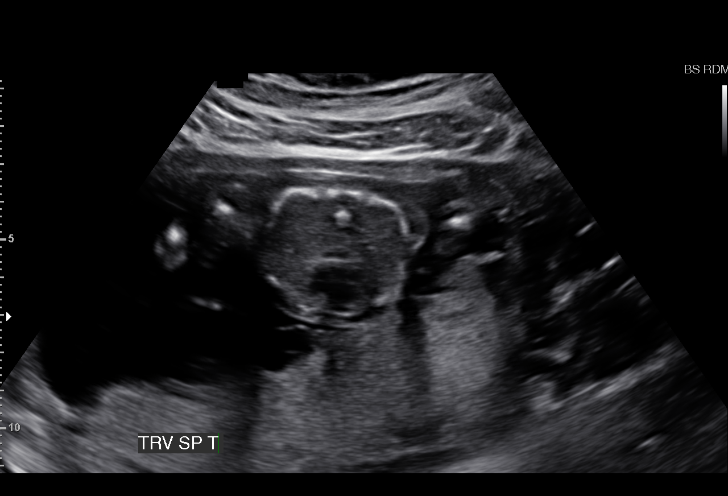
[im 39/81]
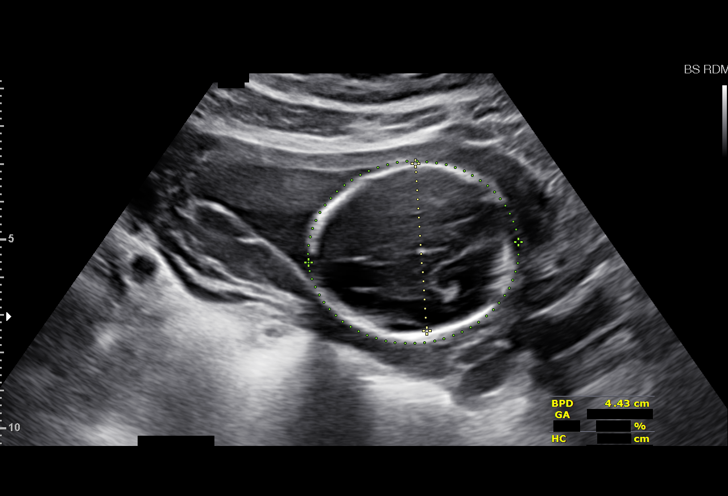
[im 46/81]
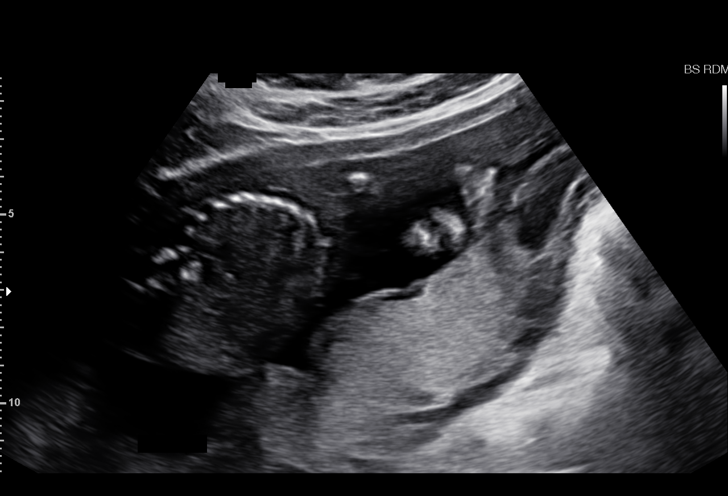
[im 53/81]
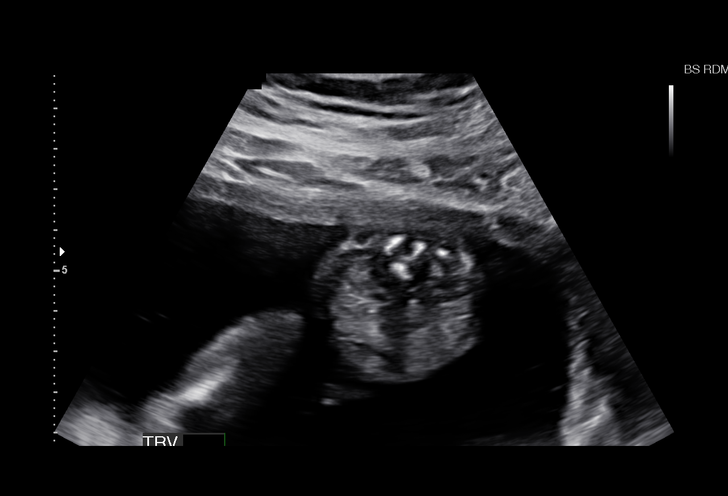
[im 60/81]
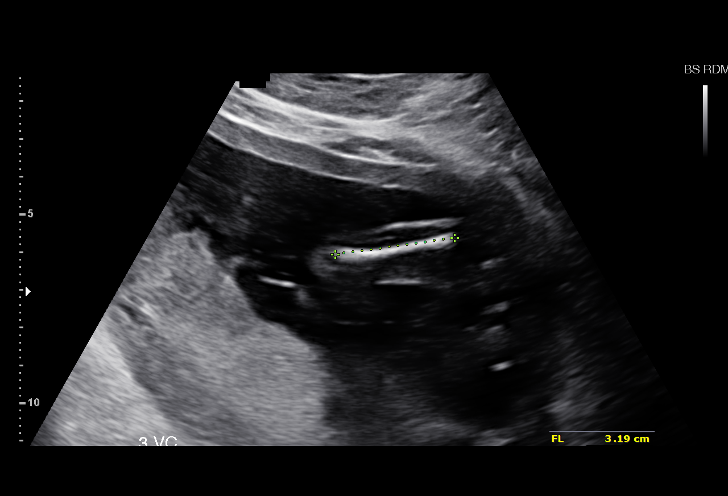
[im 67/81]
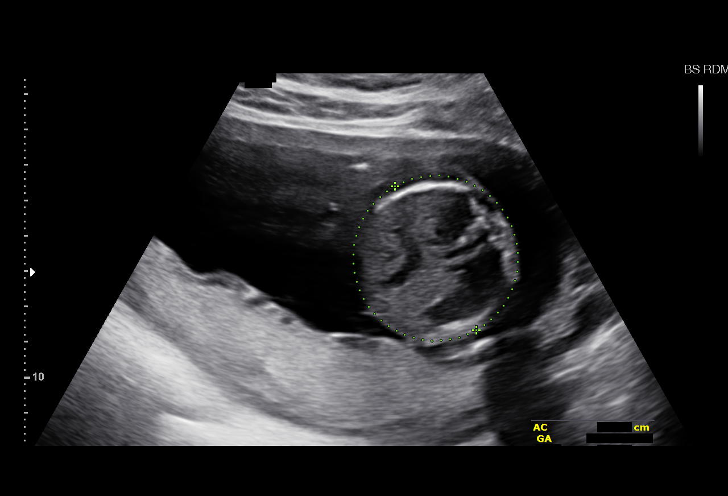
[im 74/81]
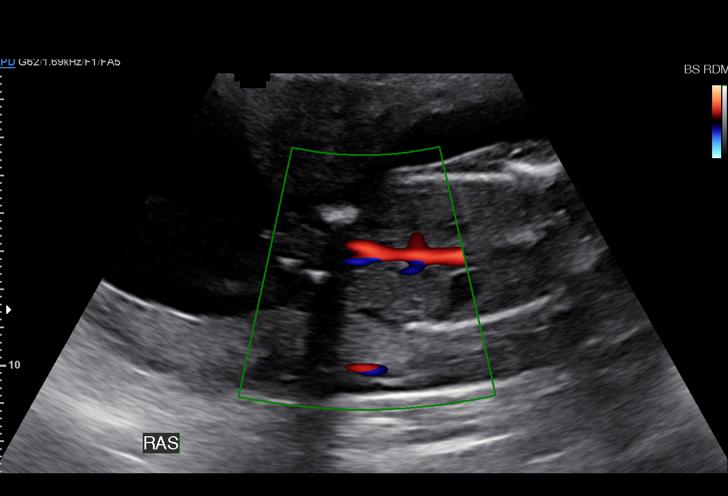
[im 81/81]
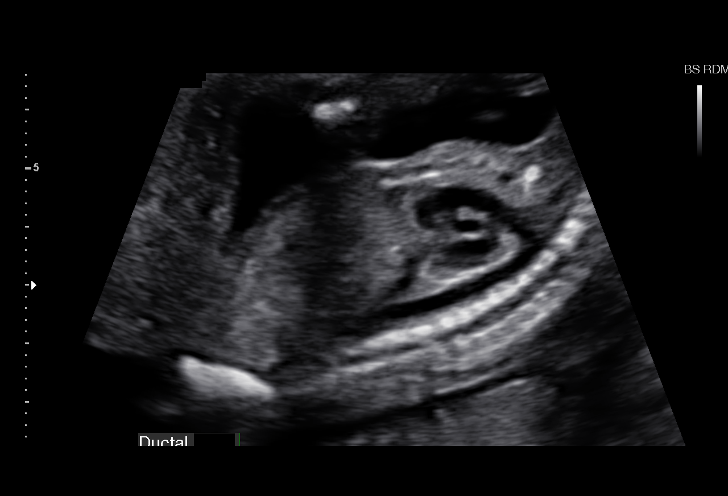

[Series 3: us mfm ob detail+14 wk · 2 of 14 slices shown (2 of 2)]
[im 5/14]
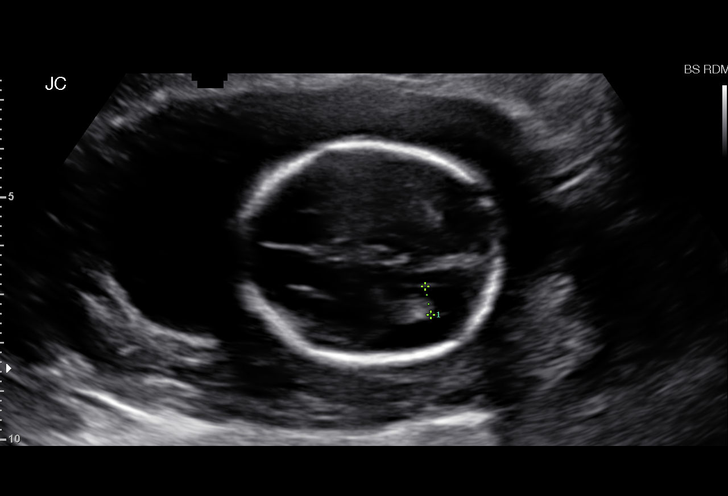
[im 14/14]
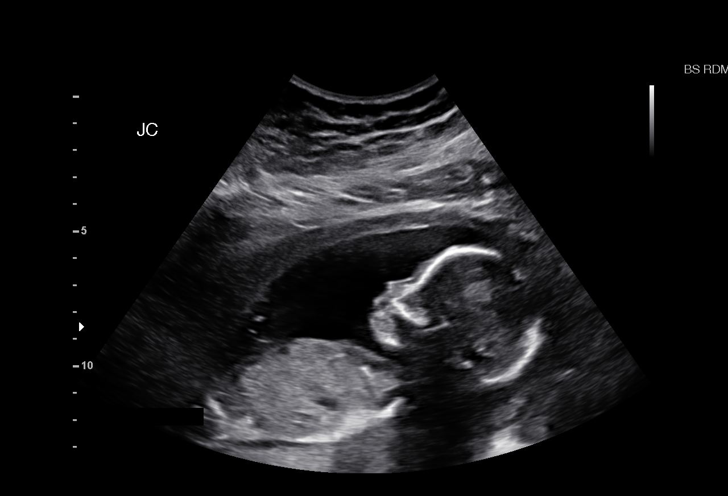

[14 of 28 positions shown; findings below may reference images not displayed]

Hospital Clinic-
Faculty Physician
OB/Gyn Clinic

Indications

Basic anatomic survey                          Z36
Herpes simplex virus (PHALANDE)
Smoking complicating pregnancy, second
trimester
Maternal morbid obesity
19 weeks gestation of pregnancy
History of fetal abnormality in previous
pregnancy, currently pregnant (Nasal bifid,
placental cyst)
OB History

Gravidity:    4         Term:   2        Prem:   0        SAB:   1
TOP:          0       Ectopic:  0        Living: 2
Fetal Evaluation

Num Of Fetuses:     1
Fetal Heart         141
Rate(bpm):
Cardiac Activity:   Observed
Presentation:       Cephalic
Placenta:           Posterior, above cervical os
P. Cord Insertion:  Visualized, central
Amniotic Fluid
AFI FV:      Subjectively within normal limits
Larg Pckt:     4.5  cm
Biometry

BPD:      44.8  mm     G. Age:  19w 4d                  CI:        76.65   %    70 - 86
FL/HC:      18.7   %    16.8 -
HC:      162.1  mm     G. Age:  19w 0d         19  %    HC/AC:      1.11        1.09 -
AC:      146.3  mm     G. Age:  20w 0d         57  %    FL/BPD:     67.6   %
FL:       30.3  mm     G. Age:  19w 3d         36  %    FL/AC:      20.7   %    20 - 24
HUM:      29.3  mm     G. Age:  19w 4d         52  %
CER:      19.4  mm     G. Age:  18w 5d         28  %
NFT:         3  mm
LV:        5.9  mm
CM:        3.7  mm

Est. FW:     302  gm    0 lb 11 oz      48  %
Gestational Age

LMP:           19w 4d        Date:  10/15/14                 EDD:   07/22/15
U/S Today:     19w 4d                                        EDD:   07/22/15
Best:          19w 4d     Det. By:  LMP  (10/15/14)          EDD:   07/22/15
Anatomy

Cranium:          Appears normal         Aortic Arch:      Appears normal
Ventricles:       Appears normal         Ductal Arch:      Not well visualized
Choroid Plexus:   Appears normal         Diaphragm:        Appears normal
Cerebellum:       Appears normal         Stomach:          Appears normal, left
sided
Posterior Fossa:  Appears normal         Abdomen:          Appears normal
Nuchal Fold:      Appears normal         Abdominal Wall:   Appears nml (cord
insert, abd wall)
Face:             Orbits appear          Cord Vessels:     Appears normal (3
normal                                   vessel cord)
Lips:             Appears normal         Kidneys:          Appear normal
Palate:           Not well visualized    Bladder:          Appears normal
Fetal Thoracic:   Not well visualized    Spine:            Limited views
appear normal
Heart:            Not well visualized    Upper             Visualized
Extremities:
RVOT:             Not well visualized    Lower             Visualized
Extremities:
LVOT:             Appears normal

Other:  Fetus appears to be a male. Heels visualized. Technically difficult due
to maternal habitus and fetal position.
Cervix Uterus Adnexa

Cervix
Length:            3.6  cm.
Normal appearance by transabdominal scan.

Left Ovary
Within normal limits.
Right Ovary
Within normal limits.

Adnexa:       No abnormality visualized.
Impression

Single IUP at 19w 4d
Limited views of the fetal heart, face and spine obtained
The remainder of the fetal anatomy appears normal
Posterior placenta without previa
Normal amniotic fluid volume
Recommendations

Recommend follow-up ultrasound examination in 4 weeks to
complete anatomy.

## 2020-05-11 ENCOUNTER — Other Ambulatory Visit: Payer: Self-pay

## 2020-05-11 ENCOUNTER — Ambulatory Visit
Admission: EM | Admit: 2020-05-11 | Discharge: 2020-05-11 | Disposition: A | Discharge status: Home / Self Care | Payer: Self-pay

## 2020-07-06 ENCOUNTER — Encounter (HOSPITAL_BASED_OUTPATIENT_CLINIC_OR_DEPARTMENT_OTHER): Payer: Self-pay | Admitting: Emergency Medicine

## 2020-07-06 ENCOUNTER — Other Ambulatory Visit: Payer: Self-pay

## 2020-07-06 ENCOUNTER — Emergency Department (HOSPITAL_BASED_OUTPATIENT_CLINIC_OR_DEPARTMENT_OTHER)
Admission: EM | Admit: 2020-07-06 | Discharge: 2020-07-06 | Disposition: A | Discharge status: Home / Self Care | Payer: Medicaid Other | Attending: Emergency Medicine | Admitting: Emergency Medicine

## 2020-07-06 ENCOUNTER — Emergency Department (HOSPITAL_BASED_OUTPATIENT_CLINIC_OR_DEPARTMENT_OTHER): Payer: Medicaid Other

## 2020-07-06 DIAGNOSIS — Z23 Encounter for immunization: Secondary | ICD-10-CM | POA: Insufficient documentation

## 2020-07-06 DIAGNOSIS — Y92016 Swimming-pool in single-family (private) house or garden as the place of occurrence of the external cause: Secondary | ICD-10-CM | POA: Insufficient documentation

## 2020-07-06 DIAGNOSIS — Y9302 Activity, running: Secondary | ICD-10-CM | POA: Insufficient documentation

## 2020-07-06 DIAGNOSIS — M25511 Pain in right shoulder: Secondary | ICD-10-CM | POA: Insufficient documentation

## 2020-07-06 DIAGNOSIS — S8992XA Unspecified injury of left lower leg, initial encounter: Secondary | ICD-10-CM | POA: Diagnosis present

## 2020-07-06 DIAGNOSIS — W01198A Fall on same level from slipping, tripping and stumbling with subsequent striking against other object, initial encounter: Secondary | ICD-10-CM | POA: Insufficient documentation

## 2020-07-06 DIAGNOSIS — J45909 Unspecified asthma, uncomplicated: Secondary | ICD-10-CM | POA: Diagnosis not present

## 2020-07-06 DIAGNOSIS — S81812A Laceration without foreign body, left lower leg, initial encounter: Secondary | ICD-10-CM | POA: Diagnosis not present

## 2020-07-06 DIAGNOSIS — F17210 Nicotine dependence, cigarettes, uncomplicated: Secondary | ICD-10-CM | POA: Insufficient documentation

## 2020-07-06 MED ORDER — HYDROCODONE-ACETAMINOPHEN 5-325 MG PO TABS
1.0000 | ORAL_TABLET | Freq: Once | ORAL | Status: AC
  Administered 2020-07-06: 12:00:00 1 via ORAL
  Filled 2020-07-06: qty 1

## 2020-07-06 MED ORDER — CEPHALEXIN 500 MG PO CAPS: 500.0000 mg | ORAL_CAPSULE | Freq: Four times a day (QID) | ORAL | 0 refills | Status: DC

## 2020-07-06 MED ORDER — ONDANSETRON 4 MG PO TBDP
8.0000 mg | ORAL_TABLET | Freq: Once | ORAL | Status: AC
  Administered 2020-07-06: 12:00:00 8 mg via ORAL
  Filled 2020-07-06: qty 2

## 2020-07-06 MED ORDER — LIDOCAINE HCL 2 % IJ SOLN
10.0000 mL | Freq: Once | INTRAMUSCULAR | Status: AC
  Administered 2020-07-06: 13:00:00 200 mg
  Filled 2020-07-06: qty 20

## 2020-07-06 MED ORDER — TETANUS-DIPHTH-ACELL PERTUSSIS 5-2.5-18.5 LF-MCG/0.5 IM SUSY
0.5000 mL | PREFILLED_SYRINGE | Freq: Once | INTRAMUSCULAR | Status: AC
  Administered 2020-07-06: 12:00:00 0.5 mL via INTRAMUSCULAR
  Filled 2020-07-06: qty 0.5

## 2020-07-06 NOTE — ED Triage Notes (Addendum)
States [place] a [place] last night about [place] and has a lac to left lower leg  states she was running after swimming, it was her birthday, lac ~ 3 inches , bleeding controlled athis time and has 2nd place left upper thigh bleeding controlled on that also UNK last tetanus
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
## 2021-03-28 ENCOUNTER — Other Ambulatory Visit: Payer: Self-pay

## 2021-03-28 ENCOUNTER — Encounter (HOSPITAL_COMMUNITY): Payer: Self-pay | Admitting: *Deleted

## 2021-03-28 ENCOUNTER — Emergency Department (HOSPITAL_COMMUNITY)
Admission: EM | Admit: 2021-03-28 | Discharge: 2021-03-28 | Disposition: A | Discharge status: Home / Self Care | Payer: Medicaid Other | Attending: Emergency Medicine | Admitting: Emergency Medicine

## 2021-03-28 DIAGNOSIS — Z202 Contact with and (suspected) exposure to infections with a predominantly sexual mode of transmission: Secondary | ICD-10-CM | POA: Insufficient documentation

## 2021-03-28 DIAGNOSIS — N760 Acute vaginitis: Secondary | ICD-10-CM | POA: Diagnosis not present

## 2021-03-28 DIAGNOSIS — Z113 Encounter for screening for infections with a predominantly sexual mode of transmission: Secondary | ICD-10-CM

## 2021-03-28 DIAGNOSIS — B9689 Other specified bacterial agents as the cause of diseases classified elsewhere: Secondary | ICD-10-CM

## 2021-03-28 LAB — WET PREP, GENITAL
Sperm: NONE SEEN
Trich, Wet Prep: NONE SEEN
WBC, Wet Prep HPF POC: <10 (ref <10)
Yeast Wet Prep HPF POC: NONE SEEN

## 2021-03-28 LAB — HIV ANTIBODY (ROUTINE TESTING W REFLEX): HIV Screen 4th Generation wRfx: NONREACTIVE

## 2021-03-28 MED ORDER — METRONIDAZOLE 500 MG PO TABS: 500.0000 mg | ORAL_TABLET | Freq: Two times a day (BID) | ORAL | 0 refills | Status: DC

## 2021-03-28 NOTE — ED Provider Notes (Signed)
?[facility] EMERGENCY DEPARTMENT ?Provider Note ? ? ?CSN: [id number] ?Arrival date & time: 03/28/21  1503 ? ?  ? ?History ? ?Chief Complaint  ?Patient presents with  ? Exposure to STD  ? ? ?Adelise Hagarty is a 31 y.o. female presenting to the ED with concern for STDs.  She found out that her husband was having intercourse with another partner.  She denies any pelvic pain, vaginal discharge, abnormal vaginal bleeding, urinary symptoms.  She would like to be tested for "all STDs."  States that she has had a tubal ligation and is not concerned about pregnancy. ? ? ?Exposure to STD ?Pertinent negatives include no chest pain, no abdominal pain and no shortness of breath.  ? ?  ? ?Home Medications ?Prior to Admission medications   ?Medication Sig Start Date End Date Taking? Authorizing Provider  ?metroNIDAZOLE (FLAGYL) 500 MG tablet Take 1 tablet (500 mg total) by mouth 2 (two) times daily. 03/28/21  Yes Suda Forbess, PA-C  ?cephALEXin (KEFLEX) 500 MG capsule Take 1 capsule (500 mg total) by mouth 4 (four) times daily. 07/06/20   Tilden Fossa, MD  ?   ? ?Allergies    ?Patient has no known allergies.   ? ?Review of Systems   ?Review of Systems  ?Constitutional:  Negative for appetite change, chills and fever.  ?HENT:  Negative for ear pain, rhinorrhea, sneezing and sore throat.   ?Eyes:  Negative for photophobia and visual disturbance.  ?Respiratory:  Negative for cough, chest tightness, shortness of breath and wheezing.   ?Cardiovascular:  Negative for chest pain and palpitations.  ?Gastrointestinal:  Negative for abdominal pain, blood in stool, constipation, diarrhea, nausea and vomiting.  ?Genitourinary:  Negative for dysuria, hematuria and urgency.  ?Musculoskeletal:  Negative for myalgias.  ?Skin:  Negative for rash.  ?Neurological:  Negative for dizziness, weakness and light-headedness.  ? ?Physical Exam ?Updated Vital Signs ?BP (!) 145/86 (BP Location: Right Arm)   Pulse 95   Temp 99.3 ?F  (37.4 ?C) (Oral)   Resp 14   LMP 03/07/2021 (Approximate)   SpO2 100%  ?Physical Exam ?Vitals and nursing note reviewed.  ?Constitutional:   ?   General: She is not in acute distress. ?   Appearance: She is well-developed.  ?HENT:  ?   Head: Normocephalic and atraumatic.  ?   Nose: Nose normal.  ?Eyes:  ?   General: No scleral icterus.    ?   Left eye: No discharge.  ?   Conjunctiva/sclera: Conjunctivae normal.  ?Cardiovascular:  ?   Rate and Rhythm: Normal rate and regular rhythm.  ?   Heart sounds: Normal heart sounds. No murmur heard. ?  No friction rub. No gallop.  ?Pulmonary:  ?   Effort: Pulmonary effort is normal. No respiratory distress.  ?   Breath sounds: Normal breath sounds.  ?Abdominal:  ?   General: Bowel sounds are normal. There is no distension.  ?   Palpations: Abdomen is soft.  ?   Tenderness: There is no abdominal tenderness. There is no guarding.  ?Genitourinary: ?   Comments: Pelvic exam done with chaperone.  No discharge noted.  No external abnormalities.  No tenderness.  No uterine tenderness. ?Musculoskeletal:     ?   General: Normal range of motion.  ?   Cervical back: Normal range of motion and neck supple.  ?Skin: ?   General: Skin is warm and dry.  ?   Findings: No rash.  ?Neurological:  ?  Mental Status: She is alert.  ?   Motor: No abnormal muscle tone.  ?   Coordination: Coordination normal.  ? ? ?ED Results / Procedures / Treatments   ?Labs ?(all labs ordered are listed, but only abnormal results are displayed) ?Labs Reviewed  ?WET PREP, GENITAL - Abnormal; Notable for the following components:  ?    Result Value  ? Clue Cells Wet Prep HPF POC PRESENT (*)   ? All other components within normal limits  ?HIV ANTIBODY (ROUTINE TESTING W REFLEX)  ?RPR  ?GC/CHLAMYDIA PROBE AMP ([place]) NOT AT [facility]  ? ? ?EKG ?None ? ?Radiology ?No results found. ? ?Procedures ?Procedures  ? ? ?Medications Ordered in ED ?Medications - No data to display ? ?ED Course/ Medical Decision Making/  A&P ?Clinical Course as of 03/28/21 1834  ?Wed Mar 28, 2021  ?1833 Clue Cells Wet Prep HPF POC(!): PRESENT [HK]  ?  ?Clinical Course User Index ?[HK] Idelle Leech, Shanitra Phillippi, PA-C  ? ?                        ?Medical Decision Making ?Amount and/or Complexity of Data Reviewed ?Labs: ordered. ? ? ?31 year old female presenting to the ED requesting STD testing due to infidelity with her husband.  Pelvic exam revealed no abnormalities.  She has had a tubal ligation and she is declining pregnancy test that she states that "I know I am not pregnant, I am not worried about that."  She is asymptomatic at this time ? ?Wet prep found to be positive for clue cells.  Otherwise unremarkable.  Will place on Flagyl and await remainder of work-up and will treat accordingly if there are any positive results.  We will contact her with results of anything is positive.  Patient is agreeable to the plan.  Return precautions given ? ? ?Patient is hemodynamically stable, in NAD, and able to ambulate in the ED. Evaluation does not show pathology that would require ongoing emergent intervention or inpatient treatment. I explained the diagnosis to the patient. Pain has been managed and has no complaints prior to discharge. Patient is comfortable with above plan and is stable for discharge at this time. All questions were answered prior to disposition. Strict return precautions for returning to the ED were discussed. Encouraged follow up with PCP.  ? ?An After Visit Summary was printed and given to the patient. ? ? ?Portions of this note were generated with Scientist, clinical (histocompatibility and immunogenetics). Dictation errors may occur despite best attempts at proofreading. ? ? ? ? ? ? ? ?Final Clinical Impression(s) / ED Diagnoses ?Final diagnoses:  ?Screen for STD (sexually transmitted disease)  ?Bacterial vaginosis  ? ? ?Rx / DC Orders ?ED Discharge Orders   ? ?      Ordered  ?  metroNIDAZOLE (FLAGYL) 500 MG tablet  2 times daily       ? 03/28/21 1833  ? ?  ?  ? ?  ? ? ?   Dietrich Pates, PA-C ?03/28/21 1834 ? ?  ?Derwood Kaplan, MD ?03/28/21 2358 ? ?

## 2021-03-28 NOTE — ED Notes (Signed)
Pelvic exam performed by Hina PA witnessed by this RN.  ?

## 2021-03-28 NOTE — Discharge Instructions (Signed)
We will contact you with the results of your work-up when it is available if you need to be treated for any type of infection. ?Return to the ER if you start to experience worsening symptoms, severe abdominal pain, pelvic pain, abnormal bleeding ?

## 2021-03-28 NOTE — ED Provider Triage Note (Signed)
Emergency Medicine Provider Triage Evaluation Note ? ?[name] , a 31 y.o. female  was evaluated in triage.  Pt complains of husband cheating on her, wants to be checked for STIs. No symptoms. No dysuria, AUB. Hx of GC/chlam she thinks but it has been years. ? ?Review of Systems  ?Positive: STI screening ?Negative: Fever, chills, discharge, dysuria ? ?Physical Exam  ?BP (!) 145/86 (BP Location: Right Arm)   Pulse 95   Temp 99.3 ?F (37.4 ?C) (Oral)   Resp 14   LMP 03/07/2021 (Approximate)   SpO2 100%  ?Gen:   Awake, no distress   ?Resp:  Normal effort  ?MSK:   Moves extremities without difficulty  ?Other:   ? ?Medical Decision Making  ?Medically screening exam initiated at 3:28 PM.  Appropriate orders placed.  [name] was informed that the remainder of the evaluation will be completed by another provider, this initial triage assessment does not replace that evaluation, and the importance of remaining in the ED until their evaluation is complete. ? ?Workup initiated, patient will need bed for pelvic exam ?  ?Olene Floss, PA-C ?03/28/21 1529 ? ?

## 2021-03-28 NOTE — ED Notes (Signed)
AVS with prescriptions provided to and discussed with patient. Pt verbalizes understanding of discharge instructions and denies any questions or concerns at this time. Pt ambulated out of department independently with steady gait. ? ?

## 2021-03-28 NOTE — ED Triage Notes (Signed)
Pt reports possible exposure to std and wants to be checked. Denies any symptoms.  ?

## 2021-03-29 LAB — GC/CHLAMYDIA PROBE AMP (~~LOC~~) NOT AT ARMC
Chlamydia: NEGATIVE
Comment: NEGATIVE
Comment: NORMAL
Neisseria Gonorrhea: NEGATIVE

## 2021-03-29 LAB — RPR: RPR Ser Ql: NONREACTIVE

## 2022-06-11 ENCOUNTER — Other Ambulatory Visit: Payer: Self-pay

## 2022-06-11 ENCOUNTER — Ambulatory Visit
Admission: EM | Admit: 2022-06-11 | Discharge: 2022-06-11 | Disposition: A | Discharge status: Home / Self Care | Payer: Medicaid Other | Attending: Family Medicine | Admitting: Family Medicine

## 2022-06-11 VITALS — BP 147/101 | HR 89 | Temp 98.9°F | Resp 18

## 2022-06-11 DIAGNOSIS — Z113 Encounter for screening for infections with a predominantly sexual mode of transmission: Secondary | ICD-10-CM | POA: Diagnosis present

## 2022-06-11 NOTE — ED Provider Notes (Signed)
[facility]    CSN: [id number] Arrival date & time: 06/11/22  1012      History   Chief Complaint Chief Complaint  Patient presents with   Exposure to STD    HPI [name] is a 32 y.o. female.   Patient is here for std screening.  No vaginal symptoms.  No known exposures.  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  She is without symptoms.  Last lab draw here in 09/2021 was rpr negative.        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 Authorizing Provider  cephALEXin (KEFLEX) 500 MG capsule Take 1 capsule (500 mg total) by mouth 4 (four) times daily. Patient not taking: Reported on 06/11/2022 07/06/20   Tilden Fossa, MD  metroNIDAZOLE (FLAGYL) 500 MG tablet Take 1 tablet (500 mg total) by mouth 2 (two) times daily. Patient not taking: Reported on 06/11/2022 03/28/21   Dietrich Pates, PA-C    Family History Family History  Problem Relation Age of Onset   Diabetes Maternal Grandmother    Hypertension Maternal Grandmother    Anesthesia problems Neg Hx     Social History Social History    Tobacco Use   Smoking status: Every Day    Packs/day: 0.25    Years: 5.00    Additional pack years: 0.00    Total pack years: 1.25    Types: Cigarettes    Last attempt to quit: 10/17/2014    Years since quitting: 7.6   Smokeless tobacco: Never  Substance Use Topics   Alcohol use: Yes    Comment: Quit: 03/17/12   Drug use: Yes    Types: Marijuana     Allergies   Patient has no known allergies.   Review of Systems Review of Systems  Constitutional: Negative.   HENT: Negative.    Respiratory: Negative.    Cardiovascular: Negative.   Gastrointestinal: Negative.   Musculoskeletal: Negative.      Physical Exam Triage Vital Signs ED Triage Vitals  Enc Vitals Group     BP 06/11/22 1048 (!) 147/101     Pulse Rate 06/11/22 1048 89     Resp 06/11/22 1048 18     Temp 06/11/22 1048 98.9 F (37.2 C)     Temp Source 06/11/22  1048 Oral     SpO2 06/11/22 1048 97 %     Weight --      Height --      Head Circumference --      Peak Flow --      Pain Score 06/11/22 1049 0     Pain Loc --      Pain Edu? --      Excl. in GC? --    No data found.  Updated Vital Signs BP (!) 147/101 (BP Location: Left Arm)   Pulse 89   Temp 98.9 F (37.2 C) (Oral)   Resp 18   SpO2 97%   Visual Acuity Right Eye Distance:   Left Eye Distance:   Bilateral Distance:    Right Eye Near:   Left Eye Near:    Bilateral Near:     Physical Exam Constitutional:      Appearance: Normal appearance.  Cardiovascular:     Rate and Rhythm: Normal rate and regular rhythm.  Pulmonary:     Effort: Pulmonary effort is normal.     Breath sounds: Normal breath sounds.  Neurological:     General: No focal deficit present.     Mental Status: She is alert.  Psychiatric:        Mood and Affect: Mood normal.      UC Treatments / Results  Labs (all labs ordered are listed, but only abnormal results are displayed) Labs Reviewed  HIV ANTIBODY (ROUTINE TESTING W REFLEX)  RPR  CERVICOVAGINAL  ANCILLARY ONLY    EKG   Radiology No results found.  Procedures Procedures (including critical care time)  Medications Ordered in UC Medications - No data to display  Initial Impression / Assessment and Plan / UC Course  I have reviewed the triage vital signs and the nursing notes.  Pertinent labs & imaging results that were available during my care of the patient were reviewed by me and considered in my medical decision making (see chart for details).   Final Clinical Impressions(s) / UC Diagnoses   Final diagnoses:  Screening for STD (sexually transmitted disease)     Discharge Instructions      You were seen today for screening for STDs.  Your swab and lab work will be resulted tomorrow and if anything is positive we will call and notify you for treatment.  Avoid intercourse until testing is resulted.     ED Prescriptions   None    PDMP not reviewed this encounter.   Jannifer Franklin, MD 06/11/22 1105

## 2022-06-11 NOTE — Discharge Instructions (Signed)
You were seen today for screening for STDs.  Your swab and lab work will be resulted tomorrow and if anything is positive we will call and notify you for treatment.  Avoid intercourse until testing is resulted.

## 2022-06-11 NOTE — ED Triage Notes (Signed)
Pt here for std screening; denies sx; pt sts had blood testing for plasma donation showing reactive RPR; pt here for follow up; denies hx of same in past

## 2022-06-12 ENCOUNTER — Telehealth (HOSPITAL_COMMUNITY): Payer: Self-pay | Admitting: Emergency Medicine

## 2022-06-12 LAB — CERVICOVAGINAL ANCILLARY ONLY
Bacterial Vaginitis (gardnerella): POSITIVE — AB
Candida Glabrata: NEGATIVE
Candida Vaginitis: NEGATIVE
Chlamydia: NEGATIVE
Comment: NEGATIVE
Comment: NEGATIVE
Comment: NEGATIVE
Comment: NEGATIVE
Comment: NEGATIVE
Comment: NORMAL
Neisseria Gonorrhea: NEGATIVE
Trichomonas: NEGATIVE

## 2022-06-12 LAB — RPR, QUANT+TP ABS (REFLEX)
Rapid Plasma Reagin, Quant: 1:64 {titer} — ABNORMAL HIGH
T Pallidum Abs: REACTIVE — AB

## 2022-06-12 LAB — HIV ANTIBODY (ROUTINE TESTING W REFLEX): HIV Screen 4th Generation wRfx: NONREACTIVE

## 2022-06-12 LAB — RPR: RPR Ser Ql: REACTIVE — AB

## 2022-06-12 MED ORDER — METRONIDAZOLE 500 MG PO TABS: 500.0000 mg | ORAL_TABLET | Freq: Two times a day (BID) | ORAL | 0 refills | Status: DC

## 2022-06-19 ENCOUNTER — Telehealth (HOSPITAL_COMMUNITY): Payer: Self-pay | Admitting: Emergency Medicine

## 2022-06-19 NOTE — Telephone Encounter (Signed)
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

## 2022-06-20 ENCOUNTER — Ambulatory Visit (HOSPITAL_COMMUNITY)
Admission: EM | Admit: 2022-06-20 | Discharge: 2022-06-20 | Disposition: A | Discharge status: Home / Self Care | Payer: Medicaid Other | Attending: Internal Medicine | Admitting: Internal Medicine

## 2022-06-20 ENCOUNTER — Ambulatory Visit
Admission: EM | Admit: 2022-06-20 | Discharge: 2022-06-20 | Disposition: A | Discharge status: Home / Self Care | Payer: Medicaid Other | Attending: Family Medicine | Admitting: Family Medicine

## 2022-06-20 DIAGNOSIS — A539 Syphilis, unspecified: Secondary | ICD-10-CM

## 2022-06-20 MED ORDER — PENICILLIN G BENZATHINE 1200000 UNIT/2ML IM SUSY
PREFILLED_SYRINGE | INTRAMUSCULAR | Status: AC
  Filled 2022-06-20: qty 4

## 2022-06-20 MED ORDER — PENICILLIN G BENZATHINE 1200000 UNIT/2ML IM SUSY: 2.4000 10*6.[IU] | PREFILLED_SYRINGE | Freq: Once | INTRAMUSCULAR | Status: DC

## 2022-06-20 MED ORDER — PENICILLIN G BENZATHINE 1200000 UNIT/2ML IM SUSY
2.4000 10*6.[IU] | PREFILLED_SYRINGE | Freq: Once | INTRAMUSCULAR | Status: AC
  Administered 2022-06-20: 2.4 10*6.[IU] via INTRAMUSCULAR

## 2022-06-20 NOTE — ED Triage Notes (Signed)
Patient presents to IM Bicillin 2.4 million units, unable to treat at previous location

## 2022-06-20 NOTE — ED Triage Notes (Signed)
Patient presents to [facility] for treatment of positive Syphilis.  Will need treatment with 2.4 million units of Bicillin.

## 2022-07-09 ENCOUNTER — Ambulatory Visit: Payer: Self-pay

## 2022-07-11 ENCOUNTER — Ambulatory Visit
Admission: RE | Admit: 2022-07-11 | Discharge: 2022-07-11 | Disposition: A | Discharge status: Home / Self Care | Payer: Medicaid Other | Source: Ambulatory Visit

## 2022-07-11 ENCOUNTER — Other Ambulatory Visit: Payer: Self-pay

## 2022-07-11 NOTE — ED Notes (Addendum)
Patient presents to [facility] for syphilis treatment. She was seen in [facility] 05/21 for testing and treated 05/30. States she received a call from the health department and was instructed she would need additional treatments. Provider was consulted and recommends she follow-up with health department for clarification and continued care. Patient voiced understanding.

## 2022-07-11 NOTE — ED Provider Notes (Signed)
Patient recently treated for positive RPR with elevated titers. Was called by the health department yesterday and advised that she needs additional treatment for syphilis. Patient is unable the reason additional treatment is needed and was advised to follow-up directly with the health department for further treatment and recommendations.  Joaquin Courts, FNP-C    Bing Neighbors, NP 07/11/22 [phone]

## 2022-08-07 ENCOUNTER — Encounter: Payer: Self-pay | Admitting: Family Medicine

## 2022-08-07 ENCOUNTER — Ambulatory Visit: Payer: Medicaid Other | Admitting: Family Medicine

## 2022-08-07 VITALS — BP 125/88 | HR 77 | Temp 98.1°F | Resp 16 | Ht 67.0 in | Wt 328.0 lb

## 2022-08-07 DIAGNOSIS — Z0000 Encounter for general adult medical examination without abnormal findings: Secondary | ICD-10-CM | POA: Diagnosis not present

## 2022-08-07 DIAGNOSIS — Z13220 Encounter for screening for lipoid disorders: Secondary | ICD-10-CM

## 2022-08-07 DIAGNOSIS — Z130 Encounter for screening for diseases of the blood and blood-forming organs and certain disorders involving the immune mechanism: Secondary | ICD-10-CM

## 2022-08-07 DIAGNOSIS — Z6841 Body Mass Index (BMI) 40.0 and over, adult: Secondary | ICD-10-CM

## 2022-08-07 DIAGNOSIS — E669 Obesity, unspecified: Secondary | ICD-10-CM

## 2022-08-07 DIAGNOSIS — Z7689 Persons encountering health services in other specified circumstances: Secondary | ICD-10-CM

## 2022-08-07 NOTE — Progress Notes (Signed)
Patient is here to established care with provider today. Patient has many health concern they would like to discuss with provider today  Care gaps discuss at appointment today  

## 2022-08-07 NOTE — Progress Notes (Signed)
New Patient Office Visit  Subjective    Patient ID: [name], female    DOB: 1990-05-19  Age: 32 y.o. MRN: [id number]  CC:  Chief Complaint  Patient presents with   Establish Care    HPI [name] presents to establish care and for routine annual exam. Patient denies acute complaints.    Outpatient Encounter Medications as of 08/07/2022  Medication Sig   [DISCONTINUED] metroNIDAZOLE (FLAGYL) 500 MG tablet Take 1 tablet (500 mg total) by mouth 2 (two) times daily.   No facility-administered encounter medications on file as of 08/07/2022.    Past Medical History:  Diagnosis Date   Asthma    Chlamydia 2011   Gonorrhea 2011   HSV infection    no outbreak ever   Vaginal Pap smear, abnormal     Past Surgical History:  Procedure Laterality Date   KNEE SURGERY  2007   TUBAL LIGATION N/A 04/15/2017   Procedure: POST PARTUM TUBAL LIGATION;  Surgeon: Levie Heritage, DO;  Location: [facility];  Service: Gynecology;  Laterality: N/A;    Family History  Problem Relation Age of Onset   Diabetes Maternal Grandmother    Hypertension Maternal Grandmother    Anesthesia problems Neg Hx     Social History   Socioeconomic History   Marital status: Single    Spouse name: Not on file   Number of children: 1   Years of education: College   Highest education level: Not on file  Occupational History    Comment: n/a  Tobacco Use   Smoking status: Former    Current packs/day: 0.00    Average packs/day: 0.3 packs/day for 5.0 years (1.3 ttl pk-yrs)    Types: Cigarettes    Start date: 10/16/2009    Quit date: 10/17/2014    Years since quitting: 7.8   Smokeless tobacco: Never  Substance and Sexual Activity   Alcohol use: Yes    Comment: Quit: 03/17/12   Drug use: Yes    Types: Marijuana   Sexual activity: Yes    Birth control/protection: None    Comment: not in last week  Other Topics Concern   Not on file  Social History Narrative   Patient  lives at home with her family   Caffeine Use: medication   Social Determinants of Health   Financial Resource Strain: Not on file  Food Insecurity: Not on file  Transportation Needs: Not on file  Physical Activity: Not on file  Stress: Not on file  Social Connections: Not on file  Intimate Partner Violence: Not on file    Review of Systems  All other systems reviewed and are negative.       Objective    BP 125/88   Pulse 77   Temp 98.1 F (36.7 C) (Oral)   Resp 16   Ht 5\' 7"  (1.702 m)   Wt (!) 328 lb (148.8 kg)   SpO2 97%   BMI 51.37 kg/m   Physical Exam Vitals and nursing note reviewed.  Constitutional:      General: She is not in acute distress.    Appearance: She is obese.  HENT:     Head: Normocephalic and atraumatic.     Right Ear: Tympanic membrane, ear canal and external ear normal.     Left Ear: Tympanic membrane, ear canal and external ear normal.     Nose: Nose normal.     Mouth/Throat:     Mouth: Mucous membranes are moist.  Pharynx: Oropharynx is clear.  Eyes:     Conjunctiva/sclera: Conjunctivae normal.     Pupils: Pupils are equal, round, and reactive to light.  Neck:     Thyroid: No thyromegaly.  Cardiovascular:     Rate and Rhythm: Normal rate and regular rhythm.     Heart sounds: Normal heart sounds. No murmur heard. Pulmonary:     Effort: Pulmonary effort is normal. No respiratory distress.     Breath sounds: Normal breath sounds.  Abdominal:     General: There is no distension.     Palpations: Abdomen is soft. There is no mass.     Tenderness: There is no abdominal tenderness.  Musculoskeletal:        General: Normal range of motion.     Cervical back: Normal range of motion and neck supple.  Skin:    General: Skin is warm and dry.  Neurological:     General: No focal deficit present.     Mental Status: She is alert and oriented to person, place, and time.  Psychiatric:        Mood and Affect: Mood normal.        Behavior:  Behavior normal.         Assessment & Plan:   1. Annual physical exam  - CMP14+EGFR  2. Screening for deficiency anemia  - CBC with Differential  3. Screening for lipid disorders  - Lipid Panel  4. Encounter to establish care     Return in about 6 months (around 02/07/2023) for follow up.   Tommie Raymond, MD

## 2022-08-08 LAB — CBC WITH DIFFERENTIAL/PLATELET
Basophils Absolute: 0.1 10*3/uL (ref 0.0–0.2)
Basos: 1 %
EOS (ABSOLUTE): 0.3 10*3/uL (ref 0.0–0.4)
Eos: 3 %
Hematocrit: 38.6 % (ref 34.0–46.6)
Hemoglobin: 12.7 g/dL (ref 11.1–15.9)
Immature Grans (Abs): 0 10*3/uL (ref 0.0–0.1)
Immature Granulocytes: 0 %
Lymphocytes Absolute: 3.2 10*3/uL — ABNORMAL HIGH (ref 0.7–3.1)
Lymphs: 38 %
MCH: 25.1 pg — ABNORMAL LOW (ref 26.6–33.0)
MCHC: 32.9 g/dL (ref 31.5–35.7)
MCV: 76 fL — ABNORMAL LOW (ref 79–97)
Monocytes Absolute: 0.5 10*3/uL (ref 0.1–0.9)
Monocytes: 6 %
Neutrophils Absolute: 4.4 10*3/uL (ref 1.4–7.0)
Neutrophils: 52 %
Platelets: 334 10*3/uL (ref 150–450)
RBC: 5.05 x10E6/uL (ref 3.77–5.28)
RDW: 16.5 % — ABNORMAL HIGH (ref 11.7–15.4)
WBC: 8.4 10*3/uL (ref 3.4–10.8)

## 2022-08-08 LAB — CMP14+EGFR
ALT: 17 IU/L (ref 0–32)
AST: 15 IU/L (ref 0–40)
Albumin: 4.2 g/dL (ref 3.9–4.9)
Alkaline Phosphatase: 93 IU/L (ref 44–121)
BUN/Creatinine Ratio: 20 (ref 9–23)
BUN: 17 mg/dL (ref 6–20)
Bilirubin Total: 0.3 mg/dL (ref 0.0–1.2)
CO2: 17 mmol/L — ABNORMAL LOW (ref 20–29)
Calcium: 7.9 mg/dL — ABNORMAL LOW (ref 8.7–10.2)
Chloride: 104 mmol/L (ref 96–106)
Creatinine, Ser: 0.87 mg/dL (ref 0.57–1.00)
Globulin, Total: 2.8 g/dL (ref 1.5–4.5)
Glucose: 106 mg/dL — ABNORMAL HIGH (ref 70–99)
Potassium: 5.7 mmol/L — ABNORMAL HIGH (ref 3.5–5.2)
Sodium: 138 mmol/L (ref 134–144)
Total Protein: 7 g/dL (ref 6.0–8.5)
eGFR: 91 mL/min/{1.73_m2} (ref >59)

## 2022-08-08 LAB — LIPID PANEL
Chol/HDL Ratio: 4.1 ratio (ref 0.0–4.4)
Cholesterol, Total: 183 mg/dL (ref 100–199)
HDL: 45 mg/dL (ref >39)
LDL Chol Calc (NIH): 107 mg/dL — ABNORMAL HIGH (ref 0–99)
Triglycerides: 177 mg/dL — ABNORMAL HIGH (ref 0–149)
VLDL Cholesterol Cal: 31 mg/dL (ref 5–40)

## 2022-08-09 ENCOUNTER — Ambulatory Visit: Payer: Medicaid Other | Admitting: Family

## 2022-08-12 ENCOUNTER — Encounter: Payer: Self-pay | Admitting: Family Medicine

## 2022-08-12 ENCOUNTER — Other Ambulatory Visit: Payer: Self-pay | Admitting: Family Medicine

## 2022-08-12 MED ORDER — IRON (FERROUS SULFATE) 325 (65 FE) MG PO TABS: 325.0000 mg | ORAL_TABLET | Freq: Every day | ORAL | 5 refills | Status: AC

## 2022-08-21 ENCOUNTER — Ambulatory Visit: Payer: Medicaid Other | Admitting: Family Medicine

## 2022-08-22 ENCOUNTER — Ambulatory Visit: Payer: Medicaid Other | Admitting: Family Medicine

## 2022-08-22 VITALS — BP 110/73 | HR 82 | Temp 98.1°F | Resp 16 | Wt 326.0 lb

## 2022-08-22 DIAGNOSIS — Z6841 Body Mass Index (BMI) 40.0 and over, adult: Secondary | ICD-10-CM | POA: Diagnosis not present

## 2022-08-22 DIAGNOSIS — Z7689 Persons encountering health services in other specified circumstances: Secondary | ICD-10-CM | POA: Diagnosis not present

## 2022-08-22 MED ORDER — PHENTERMINE HCL 37.5 MG PO CAPS: 37.5000 mg | ORAL_CAPSULE | ORAL | 0 refills | Status: AC

## 2022-08-22 NOTE — Progress Notes (Signed)
Established Patient Office Visit  Subjective    Patient ID: [name], female    DOB: 01-Jul-1990  Age: 32 y.o. MRN: [id number]  CC: No chief complaint on file.   HPI [name] presents for weight management. Patient denies acute complaints or concerns.    Outpatient Encounter Medications as of 08/22/2022  Medication Sig   phentermine 37.5 MG capsule Take 1 capsule (37.5 mg total) by mouth every morning.   Iron, Ferrous Sulfate, 325 (65 Fe) MG TABS Take 325 mg by mouth daily.   No facility-administered encounter medications on file as of 08/22/2022.    Past Medical History:  Diagnosis Date   Asthma    Chlamydia 2011   Gonorrhea 2011   HSV infection    no outbreak ever   Vaginal Pap smear, abnormal     Past Surgical History:  Procedure Laterality Date   KNEE SURGERY  2007   TUBAL LIGATION N/A 04/15/2017   Procedure: POST PARTUM TUBAL LIGATION;  Surgeon: Levie Heritage, DO;  Location: [facility];  Service: Gynecology;  Laterality: N/A;    Family History  Problem Relation Age of Onset   Diabetes Maternal Grandmother    Hypertension Maternal Grandmother    Anesthesia problems Neg Hx     Social History   Socioeconomic History   Marital status: Single    Spouse name: Not on file   Number of children: 1   Years of education: College   Highest education level: Some college, no degree  Occupational History    Comment: n/a  Tobacco Use   Smoking status: Former    Current packs/day: 0.00    Average packs/day: 0.3 packs/day for 5.0 years (1.3 ttl pk-yrs)    Types: Cigarettes    Start date: 10/16/2009    Quit date: 10/17/2014    Years since quitting: 7.8   Smokeless tobacco: Never  Substance and Sexual Activity   Alcohol use: Yes    Comment: Quit: 03/17/12   Drug use: Yes    Types: Marijuana   Sexual activity: Yes    Birth control/protection: None    Comment: not in last week  Other Topics Concern   Not on file  Social History  Narrative   Patient lives at home with her family   Caffeine Use: medication   Social Determinants of Health   Financial Resource Strain: Medium Risk (08/22/2022)   Overall Financial Resource Strain (CARDIA)    Difficulty of Paying Living Expenses: Somewhat hard  Food Insecurity: No Food Insecurity (08/22/2022)   Hunger Vital Sign    Worried About Running Out of Food in the Last Year: Never true    Ran Out of Food in the Last Year: Never true  Transportation Needs: Unknown (08/22/2022)   PRAPARE - Administrator, Civil Service (Medical): Not on file    Lack of Transportation (Non-Medical): No  Physical Activity: Insufficiently Active (08/22/2022)   Exercise Vital Sign    Days of Exercise per Week: 3 days    Minutes of Exercise per Session: 30 min  Stress: Stress Concern Present (08/22/2022)   [employer] of Occupational Health - Occupational Stress Questionnaire    Feeling of Stress : Very much  Social Connections: Moderately Integrated (08/22/2022)   Social Connection and Isolation Panel [NHANES]    Frequency of Communication with Friends and Family: More than three times a week    Frequency of Social Gatherings with Friends and Family: More than three times a week  Attends Religious Services: 1 to 4 times per year    Active Member of Clubs or Organizations: No    Attends Banker Meetings: Not on file    Marital Status: Married  Catering manager Violence: Not on file    Review of Systems  All other systems reviewed and are negative.       Objective    BP 110/73   Pulse 82   Temp 98.1 F (36.7 C) (Oral)   Resp 16   Wt (!) 326 lb (147.9 kg)   SpO2 91%   BMI 51.06 kg/m   Physical Exam Vitals and nursing note reviewed.  Constitutional:      General: She is not in acute distress.    Appearance: She is obese.  Cardiovascular:     Rate and Rhythm: Normal rate and regular rhythm.  Pulmonary:     Effort: Pulmonary effort is normal.     Breath  sounds: Normal breath sounds.  Neurological:     General: No focal deficit present.     Mental Status: She is alert and oriented to person, place, and time.         Assessment & Plan:   1. Encounter for weight management Discussed dietary and activity options. Goal is 4-6lbs wt loss/mo. Phentermine prescribed.   2. Class 3 severe obesity due to excess calories with serious comorbidity and body mass index (BMI) of 50.0 to 59.9 in adult [facility])     Return in about 4 weeks (around 09/19/2022) for follow up.   Tommie Raymond, MD

## 2023-08-04 ENCOUNTER — Ambulatory Visit
Admission: EM | Admit: 2023-08-04 | Discharge: 2023-08-04 | Disposition: A | Discharge status: Home / Self Care | Attending: Physician Assistant | Admitting: Physician Assistant

## 2023-08-04 ENCOUNTER — Ambulatory Visit (INDEPENDENT_AMBULATORY_CARE_PROVIDER_SITE_OTHER)

## 2023-08-04 ENCOUNTER — Encounter: Payer: Self-pay | Admitting: Emergency Medicine

## 2023-08-04 DIAGNOSIS — S99922A Unspecified injury of left foot, initial encounter: Secondary | ICD-10-CM

## 2023-08-04 MED ORDER — IBUPROFEN 800 MG PO TABS
800.0000 mg | ORAL_TABLET | Freq: Once | ORAL | Status: AC
  Administered 2023-08-04: 800 mg via ORAL

## 2023-08-04 NOTE — ED Triage Notes (Signed)
 Pt st's she fell yesterday morning.  Pt c/o pain to top of left foot.  Swelling present  Pt denies any ankle pain

## 2023-08-04 NOTE — ED Provider Notes (Incomplete)
 [facility]    CSN: [id number] Arrival date & time: 08/04/23  1058      History   Chief Complaint Chief Complaint  Patient presents with   Foot Injury    HPI Catarina Fuhr is a 33 y.o. female.   Patient reports that she fell yesterday.  She reports that she has pain to the top of her foot since falling.  She does have some swelling as well and has pain with weightbearing.  She denies any pain to her ankle.  She has not any numbness or tingling.  The history is provided by the patient.    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 Authorizing Provider  Iron , Ferrous Sulfate , 325 (65 Fe) MG TABS Take 325 mg by mouth daily. Patient not taking: Reported on 08/04/2023 08/12/22   Tanda Bleacher, MD  phentermine  37.5 MG capsule Take 1 capsule (37.5 mg total) by mouth every morning. Patient not taking: Reported on 08/04/2023 08/22/22   Tanda Bleacher, MD    Family History Family History  Problem Relation Age of Onset   Diabetes Maternal Grandmother     Hypertension Maternal Grandmother    Anesthesia problems Neg Hx     Social History Social History   Tobacco Use   Smoking status: Former    Current packs/day: 0.00    Average packs/day: 0.3 packs/day for 5.0 years (1.3 ttl pk-yrs)    Types: Cigarettes    Start date: 10/16/2009    Quit date: 10/17/2014    Years since quitting: 8.8   Smokeless tobacco: Never  Substance Use Topics   Alcohol use: Yes    Comment: Quit: 03/17/12   Drug use: Not Currently    Types: Marijuana     Allergies   Patient has no known allergies.   Review of Systems Review of Systems  Constitutional:  Negative for chills and fever.  Eyes:  Negative for discharge and redness.  Gastrointestinal:  Negative for abdominal pain, nausea and vomiting.  Musculoskeletal:  Positive for arthralgias.  Skin:  Positive for color change.     Physical Exam Triage Vital Signs ED Triage Vitals  Encounter Vitals Group     BP  Girls Systolic BP Percentile      Girls Diastolic BP Percentile      Boys Systolic BP Percentile      Boys Diastolic BP Percentile      Pulse      Resp      Temp      Temp src      SpO2      Weight      Height      Head Circumference      Peak Flow      Pain Score      Pain Loc      Pain Education      Exclude from Growth Chart    No data found.  Updated Vital Signs BP (!) 133/90 (BP Location: Left Arm)   Pulse 98   Temp 98 F (36.7 C) (Oral)   Resp 20   LMP 08/04/2023   SpO2 96%   Visual Acuity Right Eye Distance:   Left Eye Distance:   Bilateral Distance:    Right Eye Near:   Left Eye Near:    Bilateral Near:     Physical Exam Vitals and nursing note reviewed.  Constitutional:      General: She is not in acute distress.    Appearance: Normal appearance. She is not ill-appearing.  HENT:     Head: Normocephalic and atraumatic.  Eyes:     Conjunctiva/sclera: Conjunctivae normal.  Cardiovascular:     Rate and Rhythm: Normal rate.  Pulmonary:     Effort:  Pulmonary effort is normal. No respiratory distress.  Musculoskeletal:     Comments: Mild bruising appreciated to dorsal left foot with associated swelling.  Tenderness palpation of same.  Full range of motion of left toes and ankle  Neurological:     Mental Status: She is alert.  Psychiatric:        Mood and Affect: Mood normal.        Behavior: Behavior normal.        Thought Content: Thought content normal.      UC Treatments / Results  Labs (all labs ordered are listed, but only abnormal results are displayed) Labs Reviewed - No data to display  EKG   Radiology DG Foot Complete Left Result Date: 08/04/2023 CLINICAL DATA:  Fall.  Left foot pain and swelling. EXAM: LEFT FOOT - COMPLETE 3+ VIEW COMPARISON:  None Available. FINDINGS: No acute fracture or dislocation. No aggressive osseous lesion. There is deformity of the head of the second metatarsal, likely related to old healed injury. Correlate with point tenderness. Ankle mortise appears intact. No significant degenerative changes. There is mild diffuse soft tissue swelling over the dorsum of the forefoot. No radiopaque foreign bodies. IMPRESSION: No acute osseous abnormality of the left foot. Electronically Signed   By: Ree Molt M.D.   On: 08/04/2023 11:29    Procedures Procedures (including critical care time)  Medications Ordered in UC Medications  ibuprofen  (ADVIL ) tablet 800 mg (800 mg Oral Given 08/04/23 1121)    Initial Impression / Assessment and Plan / UC Course  I have reviewed the triage vital signs and the nursing notes.  Pertinent labs & imaging results that were available during my care of the patient were reviewed by me and considered in my medical decision making (see chart for details).  Clinical Course as of 08/05/23 1224  Mon Aug 04, 2023  1132 DG Foot Complete Left [RM]    Clinical Course User Index [RM] Billy Stabs  F, PA-C     Final Clinical Impressions(s) / UC Diagnoses    X-ray  without fracture.  Compression bandage applied in office to hopefully help with swelling and support.  Recommended elevation and ice with ibuprofen  if needed for pain.  Encouraged follow-up with Ortho should symptoms persist.   Final diagnoses:  Foot injury, left, initial encounter   Discharge Instructions   None    ED Prescriptions   None    PDMP not reviewed this encounter.   Billy Asberry FALCON, PA-C 08/05/23 1224    Billy Asberry FALCON, PA-C 08/05/23 1225

## 2023-08-04 NOTE — ED Notes (Signed)
PT to xray via wheelchair

## 2023-08-07 ENCOUNTER — Ambulatory Visit (HOSPITAL_BASED_OUTPATIENT_CLINIC_OR_DEPARTMENT_OTHER): Admitting: Student

## 2023-09-03 ENCOUNTER — Ambulatory Visit (INDEPENDENT_AMBULATORY_CARE_PROVIDER_SITE_OTHER): Admitting: Physician Assistant

## 2023-09-03 ENCOUNTER — Encounter (HOSPITAL_BASED_OUTPATIENT_CLINIC_OR_DEPARTMENT_OTHER): Payer: Self-pay | Admitting: Physician Assistant

## 2023-09-03 ENCOUNTER — Ambulatory Visit (INDEPENDENT_AMBULATORY_CARE_PROVIDER_SITE_OTHER)

## 2023-09-03 DIAGNOSIS — M79672 Pain in left foot: Secondary | ICD-10-CM | POA: Diagnosis not present

## 2023-09-03 NOTE — Progress Notes (Signed)
 Office Visit Note   Patient: [name]           Date of Birth: 1990/04/18           MRN: [id number] Visit Date: 09/03/2023              Requested by: Tanda Bleacher, MD [address] [place],  [place] [address] PCP: Tanda Bleacher, MD   Assessment & Plan: Visit Diagnoses:  1. Left foot pain     Plan: Pleasant 33 year old woman who is now almost 1 month status post forefoot folding injury to her left foot.  She was seen and evaluated at an urgent care.  She was told her x-rays were negative was placed in an Ace wrap.  She is continue to have left foot swelling and pain dorsally over the Lisfranc complex.  I do think I appreciate some widening at the Lisfranc complex.  Because of her symptoms and because of her continued pain and the appearance of her x-rays to me I am recommending a weightbearing MRI we will review this if is something that needs fixation she understands this would be important I will refer her to Dr. Genelle fe In the meantime of given her short cam walker boot Follow-Up Instructions: Will review MRI  Orders:  Orders Placed This Encounter  Procedures   DG Foot Complete Left   MR FOOT LEFT WO CONTRAST   No orders of the defined types were placed in this encounter.     Procedures: No procedures performed   Clinical Data: No additional findings.   Subjective: Chief Complaint  Patient presents with   Left Foot - Pain    HPI patient is a pleasant 33 year old woman who comes with a 1 month history of left dorsal foot pain.  She describes a folding injury to her forefoot.  She had significant pain and swelling.  She was seen and evaluated in urgent care x-rays were read as negative.  Over the course last month she reports that she continues to have dorsal foot pain and swelling.  Does not think its gotten very much better.  Review of Systems  All other systems reviewed and are negative.    Objective: Vital Signs: LMP 08/04/2023    Physical Exam Constitutional:      Appearance: Normal appearance.  Pulmonary:     Effort: Pulmonary effort is normal.  Skin:    General: Skin is warm and dry.  Neurological:     General: No focal deficit present.     Mental Status: She is alert and oriented to person, place, and time.  Psychiatric:        Mood and Affect: Mood normal.        Behavior: Behavior normal.     Ortho Exam Examination of her left foot she has moderate soft tissue swelling dorsally.  She has a palpable dorsalis pedis pulse.  She is tender with manipulation of the midfoot.  Focally tender over the Lisfranc complex.  Compartments are soft and compressible neurovascular intact Specialty Comments:  No specialty comments available.  Imaging: No results found.   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   Past Medical History:  Diagnosis Date   Asthma    Chlamydia  2011   Gonorrhea 2011   HSV infection    no outbreak ever   Vaginal Pap smear, abnormal     Family History  Problem Relation Age of Onset   Diabetes Maternal Grandmother    Hypertension Maternal Grandmother    Anesthesia problems Neg Hx     Past Surgical History:  Procedure Laterality Date   KNEE SURGERY  2007   TUBAL LIGATION N/A 04/15/2017   Procedure: POST PARTUM TUBAL LIGATION;  Surgeon: Barbra Lang PARAS, DO;  Location: [facility];  Service: Gynecology;  Laterality: N/A;   Social History   Occupational History    Comment: n/a  Tobacco Use   Smoking status: Former    Current packs/day: 0.00    Average packs/day: 0.3 packs/day for 5.0 years (1.3 ttl pk-yrs)    Types: Cigarettes    Start date: 10/16/2009    Quit date: 10/17/2014    Years since quitting: 8.8   Smokeless  tobacco: Never  Substance and Sexual Activity   Alcohol use: Yes    Comment: Quit: 03/17/12   Drug use: Not Currently    Types: Marijuana   Sexual activity: Yes    Birth control/protection: None    Comment: not in last week

## 2023-09-04 ENCOUNTER — Encounter (HOSPITAL_BASED_OUTPATIENT_CLINIC_OR_DEPARTMENT_OTHER): Payer: Self-pay | Admitting: Physician Assistant

## 2023-09-06 ENCOUNTER — Ambulatory Visit
Admission: RE | Admit: 2023-09-06 | Discharge: 2023-09-06 | Disposition: A | Discharge status: Home / Self Care | Source: Ambulatory Visit | Attending: Physician Assistant | Admitting: Physician Assistant

## 2023-09-06 DIAGNOSIS — M79672 Pain in left foot: Secondary | ICD-10-CM

## 2023-09-10 ENCOUNTER — Encounter (HOSPITAL_BASED_OUTPATIENT_CLINIC_OR_DEPARTMENT_OTHER): Payer: Self-pay | Admitting: Physician Assistant

## 2023-09-10 ENCOUNTER — Ambulatory Visit (INDEPENDENT_AMBULATORY_CARE_PROVIDER_SITE_OTHER): Admitting: Physician Assistant

## 2023-09-10 DIAGNOSIS — S93325A Dislocation of tarsometatarsal joint of left foot, initial encounter: Secondary | ICD-10-CM | POA: Diagnosis not present

## 2023-09-10 MED ORDER — ACETAMINOPHEN 500 MG PO TABS
500.0000 mg | ORAL_TABLET | Freq: Four times a day (QID) | ORAL | 0 refills | Status: AC | PRN
Start: 2023-09-10 — End: ?

## 2023-09-10 MED ORDER — IBUPROFEN 800 MG PO TABS: 800.0000 mg | ORAL_TABLET | Freq: Three times a day (TID) | ORAL | 0 refills | Status: AC | PRN

## 2023-09-10 MED ORDER — ASPIRIN 325 MG PO TBEC: 325.0000 mg | DELAYED_RELEASE_TABLET | Freq: Every day | ORAL | 0 refills | Status: AC

## 2023-09-10 MED ORDER — OXYCODONE HCL 5 MG PO CAPS: 5.0000 mg | ORAL_CAPSULE | ORAL | 0 refills | Status: AC | PRN

## 2023-09-10 NOTE — Progress Notes (Signed)
 Office Visit Note   Patient: [name]           Date of Birth: 02-Nov-1990           MRN: [id number] Visit Date: 09/10/2023              Requested by: Tanda Bleacher, MD [address] [place],  [place] [address] PCP: Tanda Bleacher, MD   Assessment & Plan: Visit Diagnoses:  1. Lisfranc dislocation, left, initial encounter     Plan: Patient is a pleasant 33 year old woman who works as a Production designer, theatre/television/film at [employer].  She is approximately 1 month status post a folding injury to her left midfoot.  She was seen in urgent care then.  X-rays at that time did show widening of the Lisfranc complex.  She was given an Ace wrap and told she had sprained her foot.  She said she had quite a bit of swelling at the time.  She subsequently came into clinic a month later to see us .  Had a stat MRI.  MRI demonstrated disruption of the Lisfranc complex.  Was seen by Dr. Genelle today.  Discussed conservative versus surgical treatment.  By far surgery would be highly recommended as with this particular injury can lead to collapse of her midfoot arch as well as early onset arthritis if not realigned and reduced.  He reviewed the risks of the surgery as well as the recovery.  Will plan on doing this in a couple weeks.  He has told her the sooner the better she does have a when wedding coming up so she would like to wait till after the wedding but encouraged her not to wait much longer.  Called in her prescription she already has a boot and crutches which she will bring with her.  She is otherwise healthy  Follow-Up Instructions: Surgery scheduler to call  Orders:  No orders of the defined types were placed in this encounter.  Meds ordered this encounter  Medications   acetaminophen  (TYLENOL ) 500 MG tablet    Sig: Take 1 tablet (500 mg total) by mouth every 6 (six) hours as needed for mild pain (pain score 1-3).    Dispense:  30 tablet    Refill:  0   ibuprofen  (ADVIL ) 800 MG tablet    Sig: Take  1 tablet (800 mg total) by mouth every 8 (eight) hours as needed.    Dispense:  30 tablet    Refill:  0   oxycodone  (OXY-IR) 5 MG capsule    Sig: Take 1 capsule (5 mg total) by mouth every 4 (four) hours as needed.    Dispense:  20 capsule    Refill:  0   aspirin  EC 325 MG tablet    Sig: Take 1 tablet (325 mg total) by mouth daily.    Dispense:  14 tablet    Refill:  0      Procedures: No procedures performed   Clinical Data: No additional findings.   Subjective: Chief Complaint  Patient presents with   Left Foot - Follow-up    HPI pleasant 33 year old woman comes in today for MRI review of her left foot.  She is now 1 month status post folding injury to her left foot.  Was originally seen in urgent care and told she had a sprain and was given an Ace wrap came in because she had continued pain and disability on the foot.  Walking on the lateral side of her foot because of  the pain  Review of Systems  All other systems reviewed and are negative.    Objective: Vital Signs: LMP 09/03/2023 (Exact Date)   Physical Exam Constitutional:      Appearance: Normal appearance.  Pulmonary:     Effort: Pulmonary effort is normal.  Skin:    General: Skin is warm and dry.  Neurological:     General: No focal deficit present.     Mental Status: She is alert and oriented to person, place, and time.     Ortho Exam Examination of her left foot she has a palpable dorsalis pedis pulse.  Mild soft tissue swelling no erythema compartments are soft and nontender.  She is focally tender over the Lisfranc complex. Specialty Comments:  No specialty comments available.  Imaging: No results found.   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   Past Medical History:  Diagnosis Date   Asthma    Chlamydia 2011   Gonorrhea 2011   HSV infection    no outbreak ever   Vaginal Pap smear, abnormal     Family History  Problem Relation Age of Onset   Diabetes Maternal Grandmother    Hypertension Maternal Grandmother    Anesthesia problems Neg Hx     Past Surgical History:  Procedure Laterality Date   KNEE SURGERY  2007   TUBAL LIGATION N/A 04/15/2017   Procedure: POST PARTUM TUBAL LIGATION;  Surgeon: Barbra Lang PARAS, DO;  [facility]: [facility];  Service: Gynecology;  Laterality: N/A;   Social History   Occupational History    Comment: n/a  Tobacco Use   Smoking status: Former    Current packs/day: 0.00    Average packs/day: 0.3 packs/day for 5.0 years (1.3 ttl pk-yrs)    Types: Cigarettes    Start date: 10/16/2009    Quit date: 10/17/2014    Years since quitting: 8.9   Smokeless tobacco: Never  Substance and Sexual Activity   Alcohol use: Yes    Comment: Quit: 03/17/12   Drug use: Not Currently    Types: Marijuana   Sexual activity: Yes    Birth control/protection: None    Comment: not in last week

## 2023-09-10 NOTE — Addendum Note (Signed)
 Addended by: WOLFGANG CONLEY HERO on: 09/10/2023 10:57 AM   Modules accepted: Orders

## 2023-09-17 ENCOUNTER — Encounter (HOSPITAL_BASED_OUTPATIENT_CLINIC_OR_DEPARTMENT_OTHER): Payer: Self-pay | Admitting: Orthopaedic Surgery

## 2023-09-17 ENCOUNTER — Other Ambulatory Visit: Payer: Self-pay

## 2023-09-17 ENCOUNTER — Other Ambulatory Visit (HOSPITAL_BASED_OUTPATIENT_CLINIC_OR_DEPARTMENT_OTHER): Payer: Self-pay | Admitting: Orthopaedic Surgery

## 2023-09-17 DIAGNOSIS — S93325A Dislocation of tarsometatarsal joint of left foot, initial encounter: Secondary | ICD-10-CM

## 2023-09-22 ENCOUNTER — Encounter (HOSPITAL_BASED_OUTPATIENT_CLINIC_OR_DEPARTMENT_OTHER): Payer: Self-pay | Admitting: Orthopaedic Surgery

## 2023-09-22 NOTE — Anesthesia Preprocedure Evaluation (Signed)
 Anesthesia Evaluation  Patient identified by MRN, date of birth, ID band Patient awake    Reviewed: Allergy & Precautions, NPO status , Patient's Chart, lab work & pertinent test results, reviewed documented beta blocker date and time   Airway Mallampati: III  TM Distance: >3 FB     Dental no notable dental hx. (+) Teeth Intact, Dental Advisory Given   Pulmonary asthma , Patient abstained from smoking., former smoker   Pulmonary exam normal breath sounds clear to auscultation       Cardiovascular negative cardio ROS Normal cardiovascular exam Rhythm:Regular Rate:Normal     Neuro/Psych negative neurological ROS  negative psych ROS   GI/Hepatic negative GI ROS, Neg liver ROS,,,  Endo/Other    Class 4 obesityPhentermine therapy- last dose 6 days ago  Renal/GU negative Renal ROS  negative genitourinary   Musculoskeletal LisFranc injury left ankle   Abdominal  (+) + obese  Peds  Hematology negative hematology ROS (+)   Anesthesia Other Findings   Reproductive/Obstetrics                              Anesthesia Physical Anesthesia Plan  ASA: 3  Anesthesia Plan: General   Post-op Pain Management: Regional block*, Ofirmev  IV (intra-op)*, Precedex  and Dilaudid  IV   Induction: Intravenous  PONV Risk Score and Plan: 4 or greater and Treatment may vary due to age or medical condition, Midazolam , Ondansetron  and Dexamethasone   Airway Management Planned: LMA and Oral ETT  Additional Equipment: None  Intra-op Plan:   Post-operative Plan: Extubation in OR  Informed Consent: I have reviewed the patients History and Physical, chart, labs and discussed the procedure including the risks, benefits and alternatives for the proposed anesthesia with the patient or authorized representative who has indicated his/her understanding and acceptance.     Dental advisory given  Plan Discussed with: CRNA  and Anesthesiologist  Anesthesia Plan Comments:          Anesthesia Quick Evaluation

## 2023-09-23 ENCOUNTER — Other Ambulatory Visit: Payer: Self-pay

## 2023-09-23 ENCOUNTER — Ambulatory Visit (HOSPITAL_BASED_OUTPATIENT_CLINIC_OR_DEPARTMENT_OTHER): Payer: Self-pay | Admitting: Anesthesiology

## 2023-09-23 ENCOUNTER — Ambulatory Visit (HOSPITAL_BASED_OUTPATIENT_CLINIC_OR_DEPARTMENT_OTHER)
Admission: RE | Admit: 2023-09-23 | Discharge: 2023-09-23 | Disposition: A | Discharge status: Home / Self Care | Attending: Orthopaedic Surgery | Admitting: Orthopaedic Surgery

## 2023-09-23 ENCOUNTER — Encounter (HOSPITAL_BASED_OUTPATIENT_CLINIC_OR_DEPARTMENT_OTHER)
Admission: RE | Disposition: A | Discharge status: Home / Self Care | Payer: Self-pay | Source: Home / Self Care | Attending: Orthopaedic Surgery

## 2023-09-23 ENCOUNTER — Encounter (HOSPITAL_BASED_OUTPATIENT_CLINIC_OR_DEPARTMENT_OTHER): Payer: Self-pay | Admitting: Orthopaedic Surgery

## 2023-09-23 ENCOUNTER — Ambulatory Visit (HOSPITAL_BASED_OUTPATIENT_CLINIC_OR_DEPARTMENT_OTHER)

## 2023-09-23 DIAGNOSIS — J45909 Unspecified asthma, uncomplicated: Secondary | ICD-10-CM | POA: Diagnosis not present

## 2023-09-23 DIAGNOSIS — S93325A Dislocation of tarsometatarsal joint of left foot, initial encounter: Secondary | ICD-10-CM | POA: Diagnosis not present

## 2023-09-23 DIAGNOSIS — S93325D Dislocation of tarsometatarsal joint of left foot, subsequent encounter: Secondary | ICD-10-CM | POA: Diagnosis not present

## 2023-09-23 DIAGNOSIS — Z87891 Personal history of nicotine dependence: Secondary | ICD-10-CM | POA: Insufficient documentation

## 2023-09-23 DIAGNOSIS — X500XXA Overexertion from strenuous movement or load, initial encounter: Secondary | ICD-10-CM | POA: Diagnosis not present

## 2023-09-23 DIAGNOSIS — E6689 Other obesity not elsewhere classified: Secondary | ICD-10-CM | POA: Diagnosis not present

## 2023-09-23 DIAGNOSIS — Z6841 Body Mass Index (BMI) 40.0 and over, adult: Secondary | ICD-10-CM | POA: Diagnosis not present

## 2023-09-23 DIAGNOSIS — Z01818 Encounter for other preprocedural examination: Secondary | ICD-10-CM

## 2023-09-23 HISTORY — PX: OPEN REDUCTION INTERNAL FIXATION (ORIF) FOOT LISFRANC FRACTURE: SHX5990

## 2023-09-23 LAB — POCT PREGNANCY, URINE: Preg Test, Ur: NEGATIVE

## 2023-09-23 SURGERY — OPEN REDUCTION INTERNAL FIXATION (ORIF) FOOT LISFRANC FRACTURE
Anesthesia: General | Site: Foot | Laterality: Left

## 2023-09-23 MED ORDER — ONDANSETRON HCL 4 MG/2ML IJ SOLN
INTRAMUSCULAR | Status: AC
  Filled 2023-09-23: qty 2

## 2023-09-23 MED ORDER — DEXAMETHASONE SODIUM PHOSPHATE 10 MG/ML IJ SOLN
INTRAMUSCULAR | Status: AC
  Filled 2023-09-23: qty 1

## 2023-09-23 MED ORDER — GLYCOPYRROLATE PF 0.2 MG/ML IJ SOSY
PREFILLED_SYRINGE | INTRAMUSCULAR | Status: DC | PRN
  Administered 2023-09-23: .2 mg via INTRAVENOUS

## 2023-09-23 MED ORDER — LIDOCAINE 2% (20 MG/ML) 5 ML SYRINGE
INTRAMUSCULAR | Status: DC | PRN
Start: 2023-09-23 — End: 2023-09-23
  Administered 2023-09-23: 60 mg via INTRAVENOUS

## 2023-09-23 MED ORDER — ROPIVACAINE HCL 5 MG/ML IJ SOLN
INTRAMUSCULAR | Status: DC | PRN
Start: 2023-09-23 — End: 2023-09-23
  Administered 2023-09-23: 20 mL via PERINEURAL

## 2023-09-23 MED ORDER — ONDANSETRON HCL 4 MG/2ML IJ SOLN
INTRAMUSCULAR | Status: DC | PRN
  Administered 2023-09-23: 4 mg via INTRAVENOUS

## 2023-09-23 MED ORDER — OXYCODONE HCL 5 MG PO TABS
ORAL_TABLET | ORAL | Status: AC
  Filled 2023-09-23: qty 1

## 2023-09-23 MED ORDER — CEFAZOLIN SODIUM-DEXTROSE 3-4 GM/150ML-% IV SOLN
3.0000 g | INTRAVENOUS | Status: AC
  Administered 2023-09-23: 3 g via INTRAVENOUS

## 2023-09-23 MED ORDER — KETOROLAC TROMETHAMINE 30 MG/ML IJ SOLN
INTRAMUSCULAR | Status: AC
  Filled 2023-09-23: qty 1

## 2023-09-23 MED ORDER — GABAPENTIN 300 MG PO CAPS
ORAL_CAPSULE | ORAL | Status: AC
  Filled 2023-09-23: qty 1

## 2023-09-23 MED ORDER — KETOROLAC TROMETHAMINE 30 MG/ML IJ SOLN
INTRAMUSCULAR | Status: DC | PRN
  Administered 2023-09-23: 30 mg via INTRAVENOUS

## 2023-09-23 MED ORDER — DROPERIDOL 2.5 MG/ML IJ SOLN: 0.6250 mg | Freq: Once | INTRAMUSCULAR | Status: DC | PRN

## 2023-09-23 MED ORDER — FENTANYL CITRATE (PF) 100 MCG/2ML IJ SOLN
100.0000 ug | Freq: Once | INTRAMUSCULAR | Status: AC
  Administered 2023-09-23: 100 ug via INTRAVENOUS

## 2023-09-23 MED ORDER — HYDROMORPHONE HCL 1 MG/ML IJ SOLN
0.2500 mg | INTRAMUSCULAR | Status: DC | PRN
  Administered 2023-09-23: 0.5 mg via INTRAVENOUS

## 2023-09-23 MED ORDER — DEXAMETHASONE SODIUM PHOSPHATE 10 MG/ML IJ SOLN
INTRAMUSCULAR | Status: DC | PRN
  Administered 2023-09-23: 10 mg via INTRAVENOUS

## 2023-09-23 MED ORDER — ACETAMINOPHEN 500 MG PO TABS
ORAL_TABLET | ORAL | Status: AC
Start: 2023-09-23 — End: 2023-09-23
  Filled 2023-09-23: qty 2

## 2023-09-23 MED ORDER — HYDROMORPHONE HCL 1 MG/ML IJ SOLN
INTRAMUSCULAR | Status: AC
Start: 2023-09-23 — End: 2023-09-23
  Filled 2023-09-23: qty 0.5

## 2023-09-23 MED ORDER — MIDAZOLAM HCL 2 MG/2ML IJ SOLN
INTRAMUSCULAR | Status: AC
Start: 2023-09-23 — End: 2023-09-23
  Filled 2023-09-23: qty 2

## 2023-09-23 MED ORDER — MIDAZOLAM HCL 2 MG/2ML IJ SOLN
2.0000 mg | Freq: Once | INTRAMUSCULAR | Status: AC
  Administered 2023-09-23: 2 mg via INTRAVENOUS

## 2023-09-23 MED ORDER — FENTANYL CITRATE (PF) 100 MCG/2ML IJ SOLN
INTRAMUSCULAR | Status: AC
  Filled 2023-09-23: qty 2

## 2023-09-23 MED ORDER — CEFAZOLIN SODIUM-DEXTROSE 3-4 GM/150ML-% IV SOLN
INTRAVENOUS | Status: AC
  Filled 2023-09-23: qty 150

## 2023-09-23 MED ORDER — PROPOFOL 500 MG/50ML IV EMUL
INTRAVENOUS | Status: DC | PRN
  Administered 2023-09-23: 125 ug/kg/min via INTRAVENOUS

## 2023-09-23 MED ORDER — LACTATED RINGERS IV SOLN: INTRAVENOUS | Status: DC | PRN

## 2023-09-23 MED ORDER — TRANEXAMIC ACID-NACL 1000-0.7 MG/100ML-% IV SOLN
1000.0000 mg | INTRAVENOUS | Status: AC
  Administered 2023-09-23: 1000 mg via INTRAVENOUS

## 2023-09-23 MED ORDER — OXYCODONE HCL 5 MG PO TABS
5.0000 mg | ORAL_TABLET | Freq: Once | ORAL | Status: AC | PRN
  Administered 2023-09-23: 5 mg via ORAL

## 2023-09-23 MED ORDER — LIDOCAINE 2% (20 MG/ML) 5 ML SYRINGE
INTRAMUSCULAR | Status: AC
  Filled 2023-09-23: qty 5

## 2023-09-23 MED ORDER — TRANEXAMIC ACID-NACL 1000-0.7 MG/100ML-% IV SOLN
INTRAVENOUS | Status: AC
  Filled 2023-09-23: qty 100

## 2023-09-23 MED ORDER — BUPIVACAINE LIPOSOME 1.3 % IJ SUSP
INTRAMUSCULAR | Status: DC | PRN
  Administered 2023-09-23: 10 mL via PERINEURAL

## 2023-09-23 MED ORDER — VANCOMYCIN HCL 1000 MG IV SOLR
INTRAVENOUS | Status: AC
  Filled 2023-09-23: qty 20

## 2023-09-23 MED ORDER — HYDROMORPHONE HCL 1 MG/ML IJ SOLN
INTRAMUSCULAR | Status: DC | PRN
  Administered 2023-09-23: .5 mg via INTRAVENOUS

## 2023-09-23 MED ORDER — OXYCODONE HCL 5 MG/5ML PO SOLN: 5.0000 mg | Freq: Once | ORAL | Status: AC | PRN

## 2023-09-23 MED ORDER — GLYCOPYRROLATE PF 0.2 MG/ML IJ SOSY
PREFILLED_SYRINGE | INTRAMUSCULAR | Status: AC
  Filled 2023-09-23: qty 1

## 2023-09-23 MED ORDER — DEXMEDETOMIDINE HCL IN NACL 80 MCG/20ML IV SOLN
INTRAVENOUS | Status: DC | PRN
  Administered 2023-09-23: 12 ug via INTRAVENOUS
  Administered 2023-09-23: 4 ug via INTRAVENOUS

## 2023-09-23 MED ORDER — PROPOFOL 10 MG/ML IV BOLUS
INTRAVENOUS | Status: DC | PRN
  Administered 2023-09-23: 200 mg via INTRAVENOUS

## 2023-09-23 MED ORDER — FENTANYL CITRATE (PF) 100 MCG/2ML IJ SOLN
INTRAMUSCULAR | Status: DC | PRN
  Administered 2023-09-23 (×2): 50 ug via INTRAVENOUS

## 2023-09-23 MED ORDER — PHENYLEPHRINE 80 MCG/ML (10ML) SYRINGE FOR IV PUSH (FOR BLOOD PRESSURE SUPPORT): PREFILLED_SYRINGE | INTRAVENOUS | Status: DC | PRN

## 2023-09-23 MED ORDER — HYDROMORPHONE HCL 1 MG/ML IJ SOLN
INTRAMUSCULAR | Status: AC
  Filled 2023-09-23: qty 0.5

## 2023-09-23 MED ORDER — LACTATED RINGERS IV SOLN: INTRAVENOUS | Status: DC

## 2023-09-23 MED ORDER — ACETAMINOPHEN 500 MG PO TABS
1000.0000 mg | ORAL_TABLET | Freq: Once | ORAL | Status: AC
  Administered 2023-09-23: 1000 mg via ORAL

## 2023-09-23 MED ORDER — GABAPENTIN 300 MG PO CAPS
300.0000 mg | ORAL_CAPSULE | Freq: Once | ORAL | Status: AC
Start: 2023-09-23 — End: 2023-09-23
  Administered 2023-09-23: 300 mg via ORAL

## 2023-09-23 MED ORDER — ONDANSETRON HCL 4 MG/2ML IJ SOLN: 4.0000 mg | Freq: Once | INTRAMUSCULAR | Status: DC | PRN

## 2023-09-23 MED ORDER — BUPIVACAINE HCL (PF) 0.5 % IJ SOLN
INTRAMUSCULAR | Status: DC | PRN
Start: 2023-09-23 — End: 2023-09-23
  Administered 2023-09-23: 15 mL via PERINEURAL

## 2023-09-23 MED ORDER — PROPOFOL 1000 MG/100ML IV EMUL
INTRAVENOUS | Status: AC
  Filled 2023-09-23: qty 100

## 2023-09-23 MED ORDER — BUPIVACAINE HCL (PF) 0.5 % IJ SOLN
INTRAMUSCULAR | Status: AC
  Filled 2023-09-23: qty 30

## 2023-09-23 SURGICAL SUPPLY — 56 items
BLADE SURG 15 STRL LF DISP TIS (BLADE) ×2 IMPLANT
BNDG COHESIVE 4X5 TAN STRL LF (GAUZE/BANDAGES/DRESSINGS) ×1 IMPLANT
BNDG COMPR ESMARK 6X3 LF (GAUZE/BANDAGES/DRESSINGS) IMPLANT
BNDG ELASTIC 4INX 5YD STR LF (GAUZE/BANDAGES/DRESSINGS) ×1 IMPLANT
BNDG ELASTIC 6INX 5YD STR LF (GAUZE/BANDAGES/DRESSINGS) IMPLANT
BOOT STEPPER DURA LG (SOFTGOODS) IMPLANT
BOOT STEPPER DURA MED (SOFTGOODS) IMPLANT
CHLORAPREP W/TINT 26 (MISCELLANEOUS) ×1 IMPLANT
COVER BACK TABLE 60X90IN (DRAPES) ×1 IMPLANT
CUFF TRNQT CYL 24X4X16.5-23 (TOURNIQUET CUFF) IMPLANT
DRAPE C-ARM 42X72 X-RAY (DRAPES) ×1 IMPLANT
DRAPE C-ARMOR (DRAPES) ×1 IMPLANT
DRAPE EXTREMITY T 121X128X90 (DISPOSABLE) ×1 IMPLANT
DRAPE IMP U-DRAPE 54X76 (DRAPES) ×1 IMPLANT
DRAPE U-SHAPE 47X51 STRL (DRAPES) ×1 IMPLANT
ELECTRODE REM PT RTRN 9FT ADLT (ELECTROSURGICAL) ×1 IMPLANT
GAUZE PAD ABD 8X10 STRL (GAUZE/BANDAGES/DRESSINGS) ×1 IMPLANT
GAUZE SPONGE 4X4 12PLY STRL (GAUZE/BANDAGES/DRESSINGS) ×1 IMPLANT
GAUZE XEROFORM 1X8 LF (GAUZE/BANDAGES/DRESSINGS) ×1 IMPLANT
GLOVE BIO SURGEON STRL SZ 6 (GLOVE) ×1 IMPLANT
GLOVE BIO SURGEON STRL SZ7.5 (GLOVE) ×1 IMPLANT
GLOVE BIOGEL PI IND STRL 6.5 (GLOVE) ×1 IMPLANT
GLOVE BIOGEL PI IND STRL 8 (GLOVE) ×1 IMPLANT
GLOVE SURG SYN 7.5 PF PI (GLOVE) ×1 IMPLANT
GOWN STRL REUS W/ TWL LRG LVL3 (GOWN DISPOSABLE) ×2 IMPLANT
GOWN STRL REUS W/ TWL XL LVL3 (GOWN DISPOSABLE) ×1 IMPLANT
IMPL SWIVELOCK 3.5X13.5 (Anchor) IMPLANT
KIT ANCHOR INTERNAL BRACE 4.75 (Anchor) IMPLANT
KIT SWIVELOCK DX 3.5X13.5 DISP (KITS) IMPLANT
NDL HYPO 22X1.5 SAFETY MO (MISCELLANEOUS) IMPLANT
NEEDLE HYPO 22X1.5 SAFETY MO (MISCELLANEOUS) IMPLANT
NS IRRIG 1000ML POUR BTL (IV SOLUTION) ×1 IMPLANT
PACK BASIN DAY SURGERY FS (CUSTOM PROCEDURE TRAY) ×1 IMPLANT
PAD CAST 4YDX4 CTTN HI CHSV (CAST SUPPLIES) IMPLANT
PADDING CAST ABS COTTON 4X4 ST (CAST SUPPLIES) ×2 IMPLANT
PADDING CAST COTTON 6X4 STRL (CAST SUPPLIES) IMPLANT
PENCIL SMOKE EVACUATOR (MISCELLANEOUS) ×1 IMPLANT
SLEEVE SCD COMPRESS KNEE MED (STOCKING) ×1 IMPLANT
SPIKE FLUID TRANSFER (MISCELLANEOUS) IMPLANT
SPLINT PLASTER CAST FAST 5X30 (CAST SUPPLIES) IMPLANT
SPONGE T-LAP 18X18 ~~LOC~~+RFID (SPONGE) ×1 IMPLANT
STOCKINETTE 6 STRL (DRAPES) ×1 IMPLANT
SUCTION TUBE FRAZIER 10FR DISP (SUCTIONS) ×1 IMPLANT
SUT ETHILON 3 0 PS 1 (SUTURE) ×1 IMPLANT
SUT MNCRL AB 3-0 PS2 27 (SUTURE) IMPLANT
SUT MON AB 2-0 CT1 36 (SUTURE) IMPLANT
SUT VIC AB 0 CT1 27XBRD ANBCTR (SUTURE) ×1 IMPLANT
SUT VIC AB 2-0 CT1 TAPERPNT 27 (SUTURE) IMPLANT
SUT VIC AB 2-0 PS2 27 (SUTURE) IMPLANT
SUT VIC AB 2-0 SH 27XBRD (SUTURE) IMPLANT
SYR BULB EAR ULCER 3OZ GRN STR (SYRINGE) ×1 IMPLANT
SYR CONTROL 10ML LL (SYRINGE) IMPLANT
TOWEL GREEN STERILE FF (TOWEL DISPOSABLE) ×2 IMPLANT
TUBE CONNECTING 20X1/4 (TUBING) ×1 IMPLANT
TUBE SUCTION HIGH CAP CLEAR NV (SUCTIONS) ×1 IMPLANT
UNDERPAD 30X36 HEAVY ABSORB (UNDERPADS AND DIAPERS) ×1 IMPLANT

## 2023-09-23 NOTE — Anesthesia Procedure Notes (Signed)
 Anesthesia Regional Block: Adductor canal block   Pre-Anesthetic Checklist: , timeout performed,  Correct Patient, Correct Site, Correct Laterality,  Correct Procedure, Correct Position, site marked,  Risks and benefits discussed,  Surgical consent,  Pre-op evaluation,  At surgeon's request and post-op pain management  Laterality: Left  Prep: chloraprep       Needles:  Injection technique: Single-shot  Needle Type: Echogenic Stimulator Needle     Needle Length: 10cm  Needle Gauge: 21   Needle insertion depth: 8 cm   Additional Needles:   Procedures:,,,, ultrasound used (permanent image in chart),,    Narrative:  Start time: 09/23/2023 8:42 AM End time: 09/23/2023 8:47 AM Injection made incrementally with aspirations every 5 mL.  Performed by: Personally  Anesthesiologist: Jerrye Sharper, MD  Additional Notes: Timeout performed. Patient sedated. Relevant anatomy ID'd using US . Incremental 2-5ml injection of LA with frequent aspiration. Patient tolerated procedure well.

## 2023-09-23 NOTE — Progress Notes (Signed)
 Assisted Dr. Jerrye with left, adductor canal, popliteal block. Side rails up, monitors on throughout procedure. See vital signs in flow sheet. Tolerated Procedure well.

## 2023-09-23 NOTE — Anesthesia Procedure Notes (Signed)
 Anesthesia Regional Block: Popliteal block   Pre-Anesthetic Checklist: , timeout performed,  Correct Patient, Correct Site, Correct Laterality,  Correct Procedure, Correct Position, site marked,  Risks and benefits discussed,  Surgical consent,  Pre-op evaluation,  At surgeon's request and post-op pain management  Laterality: Left  Prep: chloraprep       Needles:  Injection technique: Single-shot  Needle Type: Echogenic Stimulator Needle     Needle Length: 10cm  Needle Gauge: 21   Needle insertion depth: 7 cm   Additional Needles:   Procedures:,,,, ultrasound used (permanent image in chart),,    Narrative:  Start time: 09/23/2023 8:37 AM End time: 09/23/2023 8:42 AM Injection made incrementally with aspirations every 5 mL.  Performed by: Personally  Anesthesiologist: Jerrye Sharper, MD  Additional Notes: Timeout performed. Patient sedated. Relevant anatomy ID'd using US . Incremental 2-5ml injection of LA with frequent aspiration. Patient tolerated procedure well.

## 2023-09-23 NOTE — Anesthesia Procedure Notes (Signed)
 Procedure Name: LMA Insertion Date/Time: 09/23/2023 9:21 AM  Performed by: Denton Niels CROME, CRNAPre-anesthesia Checklist: Patient identified, Emergency Drugs available, Suction available, Patient being monitored and Timeout performed Patient Re-evaluated:Patient Re-evaluated prior to induction Oxygen Delivery Method: Circle system utilized Preoxygenation: Pre-oxygenation with 100% oxygen Induction Type: IV induction Ventilation: Mask ventilation without difficulty LMA: LMA with gastric port inserted LMA Size: 4.0 Number of attempts: 1 Placement Confirmation: positive ETCO2 Dental Injury: Teeth and Oropharynx as per pre-operative assessment

## 2023-09-23 NOTE — Brief Op Note (Signed)
   Brief Op Note  Date of Surgery: 09/23/2023  Preoperative Diagnosis: LEFT FOOT LISFRANC INJURY  Postoperative Diagnosis: same  Procedure: Procedure(s): OPEN REDUCTION INTERNAL FIXATION (ORIF) FOOT LISFRANC FRACTURE  Implants: Implant Name Type Inv. Item Serial No. Manufacturer Lot No. LRB No. Used Action  KIT ANCHOR INTERNAL BRACE 4.75 - [id number] Anchor KIT ANCHOR INTERNAL BRACE 4.75  [employer] [id number] Left 1 Implanted  KIT ANCHOR INTERNAL BRACE 4.75 - [id number] Anchor KIT ANCHOR INTERNAL BRACE 4.75  [employer] [id number] Left 1 Implanted  IMPL SWIVELOCK 3.5X13.5 - [id number] Anchor IMPL SWIVELOCK 3.5X13.5  [employer] [id number] Left 1 Implanted    Surgeons: Surgeon(s): [name] Standing, MD  Anesthesia: General    Estimated Blood Loss: See anesthesia record  Complications: None  Condition to PACU: Stable  Standing Alisha Genelle, MD 09/23/2023 10:09 AM

## 2023-09-23 NOTE — Transfer of Care (Signed)
 Immediate Anesthesia Transfer of Care Note  Patient: [name]  Procedure(s) Performed: OPEN REDUCTION INTERNAL FIXATION (ORIF) FOOT LISFRANC FRACTURE (Left: Foot)  Patient Location: PACU  Anesthesia Type:GA combined with regional for post-op pain  Level of Consciousness: drowsy, patient cooperative, and responds to stimulation  Airway & Oxygen Therapy: Patient Spontanous Breathing and Patient connected to face mask oxygen  Post-op Assessment: Report given to RN and Post -op Vital signs reviewed and stable  Post vital signs: Reviewed and stable  Last Vitals:  Vitals Value Taken Time  BP 128/74 09/23/23 10:33  Temp 36.2 C 09/23/23 10:34  Pulse 97 09/23/23 10:42  Resp 34 09/23/23 10:42  SpO2 100 % 09/23/23 10:42  Vitals shown include unfiled device data.  Last Pain:  Vitals:   09/23/23 1034  TempSrc:   PainSc: Asleep      Patients Stated Pain Goal: 3 (09/23/23 0714)  Complications: No notable events documented.

## 2023-09-23 NOTE — Anesthesia Postprocedure Evaluation (Signed)
 Anesthesia Post Note  Patient: [name]  Procedure(s) Performed: OPEN REDUCTION INTERNAL FIXATION (ORIF) FOOT LISFRANC FRACTURE (Left: Foot)     Patient location during evaluation: PACU Anesthesia Type: General Level of consciousness: awake and alert and oriented Pain management: pain level controlled Vital Signs Assessment: post-procedure vital signs reviewed and stable Respiratory status: spontaneous breathing, nonlabored ventilation and respiratory function stable Cardiovascular status: blood pressure returned to baseline and stable Postop Assessment: no apparent nausea or vomiting Anesthetic complications: no   No notable events documented.  Last Vitals:  Vitals:   09/23/23 1045 09/23/23 1100  BP: (!) 136/94 (!) 133/97  Pulse: 98 92  Resp: (!) 28 (!) 28  Temp:    SpO2: 99% 98%    Last Pain:  Vitals:   09/23/23 1105  TempSrc:   PainSc: 5     LLE Motor Response: No movement due to regional block (09/23/23 1105) LLE Sensation: Numbness (09/23/23 1105)          Dietrich Samuelson A.

## 2023-09-23 NOTE — H&P (Signed)
 Office Visit Note   Patient: [name]           Date of Birth: 02-Nov-1990           MRN: [id number] Visit Date: 09/10/2023              Requested by: Tanda Bleacher, MD [address] [place],  [place] [address] PCP: Tanda Bleacher, MD   Assessment & Plan: Visit Diagnoses:  1. Lisfranc dislocation, left, initial encounter     Plan: Patient is a pleasant 33 year old woman who works as a Production designer, theatre/television/film at [employer].  She is approximately 1 month status post a folding injury to her left midfoot.  She was seen in urgent care then.  X-rays at that time did show widening of the Lisfranc complex.  She was given an Ace wrap and told she had sprained her foot.  She said she had quite a bit of swelling at the time.  She subsequently came into clinic a month later to see us .  Had a stat MRI.  MRI demonstrated disruption of the Lisfranc complex.  Was seen by Dr. Genelle today.  Discussed conservative versus surgical treatment.  By far surgery would be highly recommended as with this particular injury can lead to collapse of her midfoot arch as well as early onset arthritis if not realigned and reduced.  He reviewed the risks of the surgery as well as the recovery.  Will plan on doing this in a couple weeks.  He has told her the sooner the better she does have a when wedding coming up so she would like to wait till after the wedding but encouraged her not to wait much longer.  Called in her prescription she already has a boot and crutches which she will bring with her.  She is otherwise healthy  Follow-Up Instructions: Surgery scheduler to call  Orders:  No orders of the defined types were placed in this encounter.  Meds ordered this encounter  Medications   acetaminophen  (TYLENOL ) 500 MG tablet    Sig: Take 1 tablet (500 mg total) by mouth every 6 (six) hours as needed for mild pain (pain score 1-3).    Dispense:  30 tablet    Refill:  0   ibuprofen  (ADVIL ) 800 MG tablet    Sig: Take  1 tablet (800 mg total) by mouth every 8 (eight) hours as needed.    Dispense:  30 tablet    Refill:  0   oxycodone  (OXY-IR) 5 MG capsule    Sig: Take 1 capsule (5 mg total) by mouth every 4 (four) hours as needed.    Dispense:  20 capsule    Refill:  0   aspirin  EC 325 MG tablet    Sig: Take 1 tablet (325 mg total) by mouth daily.    Dispense:  14 tablet    Refill:  0      Procedures: No procedures performed   Clinical Data: No additional findings.   Subjective: Chief Complaint  Patient presents with   Left Foot - Follow-up    HPI pleasant 33 year old woman comes in today for MRI review of her left foot.  She is now 1 month status post folding injury to her left foot.  Was originally seen in urgent care and told she had a sprain and was given an Ace wrap came in because she had continued pain and disability on the foot.  Walking on the lateral side of her foot because of  the pain  Review of Systems  All other systems reviewed and are negative.    Objective: Vital Signs: LMP 09/03/2023 (Exact Date)   Physical Exam Constitutional:      Appearance: Normal appearance.  Pulmonary:     Effort: Pulmonary effort is normal.  Skin:    General: Skin is warm and dry.  Neurological:     General: No focal deficit present.     Mental Status: She is alert and oriented to person, place, and time.     Ortho Exam Examination of her left foot she has a palpable dorsalis pedis pulse.  Mild soft tissue swelling no erythema compartments are soft and nontender.  She is focally tender over the Lisfranc complex. Specialty Comments:  No specialty comments available.  Imaging: No results found.   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   Past Medical History:  Diagnosis Date   Asthma    Chlamydia 2011   Gonorrhea 2011   HSV infection    no outbreak ever   Vaginal Pap smear, abnormal     Family History  Problem Relation Age of Onset   Diabetes Maternal Grandmother    Hypertension Maternal Grandmother    Anesthesia problems Neg Hx     Past Surgical History:  Procedure Laterality Date   KNEE SURGERY  2007   TUBAL LIGATION N/A 04/15/2017   Procedure: POST PARTUM TUBAL LIGATION;  Surgeon: Barbra Lang PARAS, DO;  Location: [facility];  Service: Gynecology;  Laterality: N/A;   Social History   Occupational History    Comment: n/a  Tobacco Use   Smoking status: Former    Current packs/day: 0.00    Average packs/day: 0.3 packs/day for 5.0 years (1.3 ttl pk-yrs)    Types: Cigarettes    Start date: 10/16/2009    Quit date: 10/17/2014    Years since quitting: 8.9   Smokeless tobacco: Never  Substance and Sexual Activity   Alcohol use: Yes    Comment: Quit: 03/17/12   Drug use: Not Currently    Types: Marijuana   Sexual activity: Yes    Birth control/protection: None    Comment: not in last week

## 2023-09-23 NOTE — Discharge Instructions (Addendum)
 Discharge Instructions    Attending Surgeon: Elspeth Parker, MD Office Phone Number: [phone]   Diagnosis and Procedures:    Surgeries Performed: Left Lisfranc fixation  Discharge Plan:    Diet: Resume usual diet. Begin with light or bland foods.  Drink plenty of fluids.  Activity:  Nonweightbearing for 2 weeks and cam boot walker. You are advised to go home directly from the hospital or surgical center. Restrict your activities.  GENERAL INSTRUCTIONS: 1.  Please apply ice to your wound to help with swelling and inflammation. This will improve your comfort and your overall recovery following surgery.     2. Please call Dr. Danetta office at [phone] with questions Monday-Friday during business hours. If no one answers, please leave a message and someone should get back to the patient within 24 hours. For emergencies please call 911 or proceed to the emergency room.   3. Patient to notify surgical team if experiences any of the following: Bowel/Bladder dysfunction, uncontrolled pain, nerve/muscle weakness, incision with increased drainage or redness, nausea/vomiting and Fever greater than 101.0 F.  Be alert for signs of infection including redness, streaking, odor, fever or chills. Be alert for excessive pain or bleeding and notify your surgeon immediately.  WOUND INSTRUCTIONS:   Leave your dressing, cast, or splint in place until your post operative visit.  Keep it clean and dry.  Always keep the incision clean and dry until the staples/sutures are removed. If there is no drainage from the incision you should keep it open to air. If there is drainage from the incision you must keep it covered at all times until the drainage stops  Do not soak in a bath tub, hot tub, pool, lake or other body of water until 21 days after your surgery and your incision is completely dry and healed.  If you have removable sutures (or staples) they must be removed 10-14 days (unless  otherwise instructed) from the day of your surgery.     1)  Elevate the extremity as much as possible.  2)  Keep the dressing clean and dry.  3)  Please call us  if the dressing becomes wet or dirty.  4)  If you are experiencing worsening pain or worsening swelling, please call.     MEDICATIONS: Resume all previous home medications at the previous prescribed dose and frequency unless otherwise noted Start taking the  pain medications on an as-needed basis as prescribed  Please taper down pain medication over the next week following surgery.  Ideally you should not require a refill of any narcotic pain medication.  Take pain medication with food to minimize nausea. In addition to the prescribed pain medication, you may take over-the-counter pain relievers such as Tylenol .  Do NOT take additional tylenol  if your pain medication already has tylenol  in it.  Aspirin  325mg  daily per instructions on bottle. Narcotic policy: Per [facility] clinic policy, our goal is ensure optimal postoperative pain control with a multimodal pain management strategy. For all [facility] patients, our goal is to wean post-operative narcotic medications by 6 weeks post-operatively, and many times sooner. If this is not possible due to utilization of pain medication prior to surgery, your [facility] doctor will support your acute post-operative pain control for the first 6 weeks postoperatively, with a plan to transition you back to your primary pain team following that. Maralee will work to ensure a Therapist, occupational.       FOLLOWUP INSTRUCTIONS: 1. Follow up at the Physical  Therapy Clinic 3-4 days following surgery. This appointment should be scheduled unless other arrangements have been made.The Physical Therapy scheduling number is [phone] if an appointment has not already been arranged.  2. Contact Dr. Danetta office during office hours at [phone] or the practice after hours line at [phone] for  non-emergencies. For medical emergencies call 911.   Discharge Location: Home   Post Anesthesia Home Care Instructions  Activity: Get plenty of rest for the remainder of the day. A responsible individual must stay with you for 24 hours following the procedure.  For the next 24 hours, DO NOT: -Drive a car -Advertising copywriter -Drink alcoholic beverages -Take any medication unless instructed by your physician -Make any legal decisions or sign important papers.  Meals: Start with liquid foods such as gelatin or soup. Progress to regular foods as tolerated. Avoid greasy, spicy, heavy foods. If nausea and/or vomiting occur, drink only clear liquids until the nausea and/or vomiting subsides. Call your physician if vomiting continues.  Special Instructions/Symptoms: Your throat may feel dry or sore from the anesthesia or the breathing tube placed in your throat during surgery. If this causes discomfort, gargle with warm salt water. The discomfort should disappear within 24 hours.  If you had a scopolamine patch placed behind your ear for the management of post- operative nausea and/or vomiting:  1. The medication in the patch is effective for 72 hours, after which it should be removed.  Wrap patch in a tissue and discard in the trash. Wash hands thoroughly with soap and water. 2. You may remove the patch earlier than 72 hours if you experience unpleasant side effects which may include dry mouth, dizziness or visual disturbances. 3. Avoid touching the patch. Wash your hands with soap and water after contact with the patch.    Regional Anesthesia Blocks  1. You may not be able to move or feel the blocked extremity after a regional anesthetic block. This may last may last from 3-48 hours after placement, but it will go away. The length of time depends on the medication injected and your individual response to the medication. As the nerves start to wake up, you may experience tingling as the  movement and feeling returns to your extremity. If the numbness and inability to move your extremity has not gone away after 48 hours, please call your surgeon.   2. The extremity that is blocked will need to be protected until the numbness is gone and the strength has returned. Because you cannot feel it, you will need to take extra care to avoid injury. Because it may be weak, you may have difficulty moving it or using it. You may not know what position it is in without looking at it while the block is in effect.  3. For blocks in the legs and feet, returning to weight bearing and walking needs to be done carefully. You will need to wait until the numbness is entirely gone and the strength has returned. You should be able to move your leg and foot normally before you try and bear weight or walk. You will need someone to be with you when you first try to ensure you do not fall and possibly risk injury.  4. Bruising and tenderness at the needle site are common side effects and will resolve in a few days.  5. Persistent numbness or new problems with movement should be communicated to the surgeon or the [facility] [phone] [facility] [phone]).  Information for Discharge Teaching: EXPAREL  (bupivacaine  liposome injectable suspension)   Pain relief is important to your recovery. The goal is to control your pain so you can move easier and return to your normal activities as soon as possible after your procedure. Your physician may use several types of medicines to manage pain, swelling, and more.  Your surgeon or anesthesiologist gave you EXPAREL (bupivacaine ) to help control your pain after surgery.  EXPAREL  is a local anesthetic designed to release slowly over an extended period of time to provide pain relief by numbing the tissue around the surgical site. EXPAREL  is designed to release pain medication over time and can control pain for up to 72 hours. Depending  on how you respond to EXPAREL , you may require less pain medication during your recovery. EXPAREL  can help reduce or eliminate the need for opioids during the first few days after surgery when pain relief is needed the most. EXPAREL  is not an opioid and is not addictive. It does not cause sleepiness or sedation.   Important! A teal colored band has been placed on your arm with the date, time and amount of EXPAREL  you have received. Please leave this armband in place for the full 96 hours following administration, and then you may remove the band. If you return to the hospital for any reason within 96 hours following the administration of EXPAREL , the armband provides important information that your health care providers to know, and alerts them that you have received this anesthetic.    Possible side effects of EXPAREL : Temporary loss of sensation or ability to move in the area where medication was injected. Nausea, vomiting, constipation Rarely, numbness and tingling in your mouth or lips, lightheadedness, or anxiety may occur. Call your doctor right away if you think you may be experiencing any of these sensations, or if you have other questions regarding possible side effects.  Follow all other discharge instructions given to you by your surgeon or nurse. Eat a healthy diet and drink plenty of water or other fluids.  Next dose of tylenol  if needed will be at 1:21pm Next dose of ibuprofen  at 3:21pm

## 2023-09-23 NOTE — Interval H&P Note (Signed)
 History and Physical Interval Note:  09/23/2023 7:00 AM  [name]  has presented today for surgery, with the diagnosis of LEFT FOOT LISFRANC INJURY.  The various methods of treatment have been discussed with the patient and family. After consideration of risks, benefits and other options for treatment, the patient has consented to  Procedure(s) with comments: OPEN REDUCTION INTERNAL FIXATION (ORIF) FOOT LISFRANC FRACTURE (Left) - LEFT FOOT LISFRANCE REPAIR as a surgical intervention.  The patient's history has been reviewed, patient examined, no change in status, stable for surgery.  I have reviewed the patient's chart and labs.  Questions were answered to the patient's satisfaction.     Tyrion Glaude

## 2023-09-23 NOTE — Op Note (Signed)
 Date of Surgery: 09/23/2023  INDICATIONS: [name] is a 33 y.o.-year-old female with left lisfranc injury.  The risk and benefits of the procedure were discussed in detail and documented in the pre-operative evaluation.   PREOPERATIVE DIAGNOSIS: 1. Left lisfranc injury with intercuneiform instability  POSTOPERATIVE DIAGNOSIS: Same.  PROCEDURE: 1.  Open reduction internal fixation with internal brace left Lisfranc weight 2.  Open reduction internal fixation with fixation left medial middle cuneiform joint  SURGEON: [name] MD  ASSISTANT: Conley Dawson, ATC  ANESTHESIA:  general  IV FLUIDS AND URINE: See anesthesia record.  ANTIBIOTICS: Ancef   ESTIMATED BLOOD LOSS: 10 mL.  IMPLANTS:  Implant Name Type Inv. Item Serial No. Manufacturer Lot No. LRB No. Used Action  KIT ANCHOR INTERNAL BRACE 4.75 - [id number] Anchor KIT ANCHOR INTERNAL BRACE 4.75  [employer] [id number] Left 1 Implanted  KIT ANCHOR INTERNAL BRACE 4.75 - [id number] Anchor KIT ANCHOR INTERNAL BRACE 4.75  [employer] [id number] Left 1 Implanted  IMPL SWIVELOCK 3.5X13.5 - [id number] Anchor IMPL SWIVELOCK 3.5X13.5  [employer] [id number] Left 1 Implanted    DRAINS: None  CULTURES: None  COMPLICATIONS: none  DESCRIPTION OF PROCEDURE:   The patient was identified in the preoperative holding area.  The correct site was marked according to universal protocol.  At this time she was subsequently taken back to the operating room.  Anesthesia was induced.  Antibiotics were given 1 hour prior to skin incision.  She was moved over the operating table with all bony prominences padded.  Final timeout was performed.  At this time I used fluoroscopy to confirm the base of the second metatarsal.  A small incision was made here.  Metzenbaum scissors was used to dissect to the level of the second metatarsal.  At this time a reduction clamp was used around the second metatarsal and the medial cuneiform to reduce the Lisfranc  joint.  Excellent reduction was obtained.  Given this a wire was placed from lateral to medial from the native second metatarsal base to the medial cuneiform.  I was happy with the placement on AP and lateral.  At this time the medial cuneiform was drilled over the pin.  The wire was then used to place the internal brace mechanism.  While holding this in place a swivel lock was then advanced into the medial cuneiform again holding the reduction.  I was happy with the Lisfranc reduction on AP and lateral fluoroscopy.  At this time, hemostat was used in the second metatarsal bone incision under the tendon and neurovascular structures over the bone into the medial cuneiform incision.  This was used to pass the 2 free limbs of the suture.  At this time a reduction clamp was placed around the middle and medial cuneiform with excellent reduction.  The reduction was held in place by placing the swivel lock anchor into the middle cuneiform by drilling and then placing the anchor.  I was again happy with the reduction on AP and lateral fluoroscopy.  The wounds were thoroughly irrigated.  These were closed in layers of 3-0 nylon.  Soft dressing was applied with Xeroform Webril gauze Ace wrap.  Cam boot walker was placed.  All counts were correct at the end of the case there were no complications    POSTOPERATIVE PLAN: Will need nonweightbearing for a total of 2 weeks.  She be placed on aspirin  for blood clot prevention.  I will plan to see her back in 2 weeks for suture removal.  She will begin physical therapy  [name], MD 10:09 AM

## 2023-09-24 ENCOUNTER — Encounter (HOSPITAL_BASED_OUTPATIENT_CLINIC_OR_DEPARTMENT_OTHER): Payer: Self-pay | Admitting: Orthopaedic Surgery

## 2023-09-29 ENCOUNTER — Ambulatory Visit: Attending: Orthopaedic Surgery

## 2023-09-29 ENCOUNTER — Other Ambulatory Visit: Payer: Self-pay

## 2023-09-29 DIAGNOSIS — R262 Difficulty in walking, not elsewhere classified: Secondary | ICD-10-CM | POA: Insufficient documentation

## 2023-09-29 DIAGNOSIS — S93325A Dislocation of tarsometatarsal joint of left foot, initial encounter: Secondary | ICD-10-CM | POA: Insufficient documentation

## 2023-09-29 DIAGNOSIS — M79672 Pain in left foot: Secondary | ICD-10-CM | POA: Insufficient documentation

## 2023-09-29 NOTE — Therapy (Signed)
 OUTPATIENT PHYSICAL THERAPY LOWER EXTREMITY EVALUATION   Patient Name: [name] MRN: [id number] DOB:10-14-1990, 33 y.o., female Today's Date: 09/29/2023  END OF SESSION:  PT End of Session - 09/29/23 1027     Visit Number 1    Date for PT Re-Evaluation 12/22/23    Progress Note Due on Visit 10    PT Start Time 1015    PT Stop Time 1100    PT Time Calculation (min) 45 min    Activity Tolerance Patient tolerated treatment well    Behavior During Therapy [facility] for tasks assessed/performed          Past Medical History:  Diagnosis Date   Asthma    Chlamydia 2011   Gonorrhea 2011   HSV infection    no outbreak ever   Vaginal Pap smear, abnormal    Past Surgical History:  Procedure Laterality Date   KNEE SURGERY  2007   OPEN REDUCTION INTERNAL FIXATION (ORIF) FOOT LISFRANC FRACTURE Left 09/23/2023   Procedure: OPEN REDUCTION INTERNAL FIXATION (ORIF) FOOT LISFRANC FRACTURE;  Surgeon: Genelle Standing, MD;  Location: [facility];  Service: Orthopedics;  Laterality: Left;  LEFT FOOT LISFRANCE REPAIR   TUBAL LIGATION N/A 04/15/2017   Procedure: POST PARTUM TUBAL LIGATION;  Surgeon: Barbra Lang PARAS, DO;  [facility];  Service: Gynecology;  Laterality: N/A;   Patient Active Problem List   Diagnosis Date Noted   Dislocation of tarsometatarsal joint of left foot 09/10/2023   Unwanted fertility 03/25/2017   Herpes simplex type 2 infection 08/07/2011   Morbid obesity with BMI of 45.0-49.9, adult (HCC) 03/25/2011   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   THERAPY DIAG:  Difficulty in walking, not elsewhere classified  Pain in left foot  Rationale for Evaluation and Treatment: Rehabilitation  ONSET DATE:  injury aug 2, surgical repair 09/23/23  SUBJECTIVE:   SUBJECTIVE STATEMENT: I fell, injured foot, went to urgent care, then eventually got into Dr. Lacie and he did the surgery.  I can't use the crutches too well, just going to and from bathroom. Staying in my bed most of the time with foot elevated  PERTINENT HISTORY: Fell at home, injured L foot  PAIN:  Are you having pain? Yes: NPRS scale: 0 to 5 Pain location: R dorsum foot  Aggravating factors: movement Relieving factors: resting   PRECAUTIONS: Other: NWB L, use Cam boot with all upright activities and when sleeping  RED FLAGS: None   WEIGHT BEARING RESTRICTIONS: Yes NWB L   FALLS:  Has patient fallen in last 6 months? Yes. Number of falls 1  LIVING ENVIRONMENT: Lives with: lives with their family Lives in: House/apartment Stairs: Yes: External: 3 steps; on left going up Has following equipment at home: Crutches  OCCUPATION: stay at home Mom  PLOF: Independent  PATIENT GOALS: Be able to walk normally and take care of my kids normally   NEXT MD VISIT: returns to surgeon Sept 17  OBJECTIVE:  Note: Objective measures were completed at Evaluation unless otherwise noted.  DIAGNOSTIC FINDINGS: see surgical notes  PATIENT SURVEYS:  Lower Extremity Functional Score: 10 / 80 = 12.5 %  COGNITION: Overall cognitive status: Within functional limits for tasks assessed     SENSATION: [facility]  EDEMA:    MUSCLE LENGTH: Hamstrings: Right wfl deg; Left wfl deg Debby test: Right wfl deg; Left wfl deg  POSTURE: rounded shoulders, forward head, and L foot with 2 incisions, one medial dorsal mid foot and one mid foot dorsal surface with sutures intact.  No drainage or discoloration.  One pinhole open area just medial to the lateral scar  PALPATION: Na L foot due to recent surgery  LOWER EXTREMITY ROM:  Active ROM Right eval Left eval  Hip flexion Wfl, all motions wfl  Hip extension  wfl  Hip abduction  wfl  Hip  adduction  wfl  Hip internal rotation  wfl  Hip external rotation  wfl  Knee flexion  wfl  Knee extension  wfl  Ankle dorsiflexion  deferred  Ankle plantarflexion    Ankle inversion    Ankle eversion     (Blank rows = not tested)  LOWER EXTREMITY MMT:all wfl except L knee, ankle musculature NT    FUNCTIONAL TESTS:  Timed up and go (TUG): greater than 30 sec and un able to complete while maintaining NWB L   GAIT: Distance walked: 10' Assistive device utilized: Crutches Level of assistance: SBA Comments: unable to really maintain NWB L, more touch down pattern.  Curtches poor fit, adjusted with better balance , posture noted                                                                                                                                TREATMENT DATE: 09/29/23  Eval.  Removed ace bandage and guaze wrap, also xeroform bandage Applied 2 tegraderm bandages to dorsal foot over incisions.  Also added guaze wrap around L foot and ankle for protection / padding Positioned pt in prone with cam boot on, instructed to stretch prone on elbows, 1-2 x a day to get her trunk into an alternative position Advised her to wear boot while sleeping   PATIENT EDUCATION:  Education details: POC, goals Person educated: Patient Education method: Explanation, Demonstration, Tactile cues, and Verbal cues Education comprehension: verbalized understanding, returned demonstration, verbal cues required, tactile cues required, and needs further education  HOME EXERCISE PROGRAM: TBD after her follow up with Dr Lacie  ASSESSMENT:  CLINICAL IMPRESSION: Patient is a 33 y.o. female who was evaluated today for physical therapy following surgical repair of LisFranc dislocation L foot, after a traumatic fall. The pt currently is NWB L, with cam boot so today's session consisted of changing her bandage, also adjusted her crutches for better fit, education in her healing process, and getting into  prone to offset her prolonged sitting and lying down in supine.  She should benefit from physical therapy to assist her with regaining her mobility, strength, balance and gait once she is cleared for more activity through the orthopedic surgeon.    OBJECTIVE IMPAIRMENTS: decreased activity tolerance, decreased balance, decreased endurance, decreased knowledge of condition, decreased knowledge of use of DME, decreased mobility,  difficulty walking, decreased ROM, decreased strength, decreased safety awareness, impaired flexibility, postural dysfunction, and pain.   ACTIVITY LIMITATIONS: carrying, lifting, standing, squatting, sleeping, stairs, transfers, bed mobility, bathing, toileting, dressing, hygiene/grooming, and locomotion level  PARTICIPATION LIMITATIONS: medication management  PERSONAL FACTORS: Behavior pattern, Fitness, Time since onset of injury/illness/exacerbation, and 1-2 comorbidities: obsity are also affecting patient's functional outcome.   REHAB POTENTIAL: Good  CLINICAL DECISION MAKING: Evolving/moderate complexity  EVALUATION COMPLEXITY: Moderate   GOALS: Goals reviewed with patient? Yes  SHORT TERM GOALS: Target date: 4 weeks, 10/27/23 I HEP Baseline: Goal status: INITIAL  LONG TERM GOALS: Target date: 12 weeks, 12/22/23  TUG score less than 14 sec with LAD  Baseline: failed at eval Goal status: INITIAL  2.  LEFS improve from 12.5% to 50% or greater Baseline:  Goal status: INITIAL  3.  L ankle ROM for plantarflexion 45 degrees, dorsiflexion 10 degrees or greater Baseline: TBD Goal status: INITIAL  4.  L ankle strength 4/5  Baseline: TBD Goal status: INITIAL  5.  FGA 27/30 Baseline: TBD Goal status: INITIAL     PLAN:  PT FREQUENCY: 1x/week  PT DURATION: 12 weeks  PLANNED INTERVENTIONS: 97110-Therapeutic exercises, 97530- Therapeutic activity, [id number]- Neuromuscular re-education, 97535- Self Care, and 02859- Manual therapy  PLAN FOR NEXT SESSION:  initiate ROM, strengthening pending updated direction from orthopedic surgeon, gait training as appropriate   Desirai Traxler L Azuree Minish, PT, DPT, OCS 09/29/2023, 12:11 PM

## 2023-10-08 ENCOUNTER — Ambulatory Visit (INDEPENDENT_AMBULATORY_CARE_PROVIDER_SITE_OTHER): Admitting: Orthopaedic Surgery

## 2023-10-08 DIAGNOSIS — S93325A Dislocation of tarsometatarsal joint of left foot, initial encounter: Secondary | ICD-10-CM

## 2023-10-08 NOTE — Progress Notes (Signed)
 Post Operative Evaluation    Procedure/Date of Surgery: Left Lisfranc repair 9/2  Interval History:    Presents 2 weeks status post left Lisfranc internal brace with intercuneiform repair overall doing extremely well.  She has been compliant with nonweightbearing crutches as well as boot usage.  She has been able to work with physical therapy   PMH/PSH/Family History/Social History/Meds/Allergies:    Past Medical History:  Diagnosis Date   Asthma    Chlamydia 2011   Gonorrhea 2011   HSV infection    no outbreak ever   Vaginal Pap smear, abnormal    Past Surgical History:  Procedure Laterality Date   KNEE SURGERY  2007   OPEN REDUCTION INTERNAL FIXATION (ORIF) FOOT LISFRANC FRACTURE Left 09/23/2023   Procedure: OPEN REDUCTION INTERNAL FIXATION (ORIF) FOOT LISFRANC FRACTURE;  Surgeon: Genelle Standing, MD;  Location: [facility];  Service: Orthopedics;  Laterality: Left;  LEFT FOOT LISFRANCE REPAIR   TUBAL LIGATION N/A 04/15/2017   Procedure: POST PARTUM TUBAL LIGATION;  Surgeon: Barbra Lang PARAS, DO;  [facility];  Service: Gynecology;  Laterality: N/A;   Social History   Socioeconomic History   Marital status: Single    Spouse name: Not on file   Number of children: 1   Years of education: College   Highest education level: Some college, no degree  Occupational History    Comment: n/a  Tobacco Use   Smoking status: Former    Current packs/day: 0.00    Average packs/day: 0.3 packs/day for 5.0 years (1.3 ttl pk-yrs)    Types: Cigarettes    Start date: 10/16/2009    Quit date: 10/17/2014    Years since quitting: 8.9   Smokeless tobacco: Never  Vaping Use   Vaping status: Never Used  Substance and Sexual Activity   Alcohol use: Yes   Drug use: Not Currently    Types: Marijuana    Comment: last used 09/20/23   Sexual activity: Yes    Birth control/protection: None    Comment: not in last week  Other  Topics Concern   Not on file  Social History Narrative   Patient lives at home with her family   Caffeine  Use: medication   Social Drivers of Health   Financial Resource Strain: Medium Risk (08/22/2022)   Overall Financial Resource Strain (CARDIA)    Difficulty of Paying Living Expenses: Somewhat hard  Food Insecurity: No Food Insecurity (08/22/2022)   Hunger Vital Sign    Worried About Running Out of Food in the Last Year: Never true    Ran Out of Food in the Last Year: Never true  Transportation Needs: Unknown (08/22/2022)   PRAPARE - Administrator, Civil Service (Medical): Not on file    Lack of Transportation (Non-Medical): No  Physical Activity: Insufficiently Active (08/22/2022)   Exercise Vital Sign    Days of Exercise per Week: 3 days    Minutes of Exercise per Session: 30 min  Stress: Stress Concern Present (08/22/2022)   [employer] of Occupational Health - Occupational Stress Questionnaire    Feeling of Stress : Very much  Social Connections: Moderately Integrated (08/22/2022)   Social Connection and Isolation Panel    Frequency of Communication with Friends and Family: More than three times a week  Frequency of Social Gatherings with Friends and Family: More than three times a week    Attends Religious Services: 1 to 4 times per year    Active Member of [employer] or Organizations: No    Attends Engineer, structural: Not on file    Marital Status: Married   Family History  Problem Relation Age of Onset   Diabetes Maternal Grandmother    Hypertension Maternal Grandmother    Anesthesia problems Neg Hx    No Known Allergies Current Outpatient Medications  Medication Sig Dispense Refill   acetaminophen  (TYLENOL ) 500 MG tablet Take 1 tablet (500 mg total) by mouth every 6 (six) hours as needed for mild pain (pain score 1-3). 30 tablet 0   aspirin  EC 325 MG tablet Take 1 tablet (325 mg total) by mouth daily. 14 tablet 0   ibuprofen  (ADVIL ) 800 MG tablet  Take 1 tablet (800 mg total) by mouth every 8 (eight) hours as needed. 30 tablet 0   Iron , Ferrous Sulfate , 325 (65 Fe) MG TABS Take 325 mg by mouth daily. (Patient not taking: Reported on 08/04/2023) 30 tablet 5   oxycodone  (OXY-IR) 5 MG capsule Take 1 capsule (5 mg total) by mouth every 4 (four) hours as needed. 20 capsule 0   phentermine  37.5 MG capsule Take 1 capsule (37.5 mg total) by mouth every morning. (Patient not taking: Reported on 08/04/2023) 30 capsule 0   No current facility-administered medications for this visit.   No results found.  Review of Systems:   A ROS was performed including pertinent positives and negatives as documented in the HPI.   Musculoskeletal Exam:    unknown if currently breastfeeding.  Foot with minimal swelling.  Distal neurosensory exam is intact.  Incisions are clean dry and intact.  Imaging:      I personally reviewed and interpreted the radiographs.   Assessment:   2 weeks status post left Lisfranc internal brace placement/repair doing extremely well.  At this time she may progress her weightbearing.  She will continue to use her boot as well as crutches.  I will plan to see her back in 4 weeks for reassessment  Plan :    - Return to clinic 4 weeks for reassessment      I personally saw and evaluated the patient, and participated in the management and treatment plan.  Elspeth Parker, MD Attending Physician, Orthopedic Surgery  This document was dictated using Dragon voice recognition software. A reasonable attempt at proof reading has been made to minimize errors.

## 2023-10-09 ENCOUNTER — Ambulatory Visit: Admitting: Physical Therapy

## 2023-10-14 ENCOUNTER — Ambulatory Visit: Admitting: Physical Therapy

## 2023-10-15 ENCOUNTER — Ambulatory Visit: Admitting: Physical Therapy

## 2023-10-15 ENCOUNTER — Encounter: Payer: Self-pay | Admitting: Physical Therapy

## 2023-10-15 DIAGNOSIS — R262 Difficulty in walking, not elsewhere classified: Secondary | ICD-10-CM | POA: Diagnosis not present

## 2023-10-15 DIAGNOSIS — M79672 Pain in left foot: Secondary | ICD-10-CM

## 2023-10-15 NOTE — Therapy (Signed)
 OUTPATIENT PHYSICAL THERAPY LOWER EXTREMITY EVALUATION   Patient Name: [name] MRN: [id number] DOB:Aug 10, 1990, 33 y.o., female Today's Date: 10/15/2023  END OF SESSION:  PT End of Session - 10/15/23 1307     Visit Number 2    Date for Recertification  12/22/23    PT Start Time 1305    PT Stop Time 1345    PT Time Calculation (min) 40 min    Activity Tolerance Patient tolerated treatment well    Behavior During Therapy [facility] for tasks assessed/performed          Past Medical History:  Diagnosis Date   Asthma    Chlamydia 2011   Gonorrhea 2011   HSV infection    no outbreak ever   Vaginal Pap smear, abnormal    Past Surgical History:  Procedure Laterality Date   KNEE SURGERY  2007   OPEN REDUCTION INTERNAL FIXATION (ORIF) FOOT LISFRANC FRACTURE Left 09/23/2023   Procedure: OPEN REDUCTION INTERNAL FIXATION (ORIF) FOOT LISFRANC FRACTURE;  Surgeon: Genelle Standing, MD;  Location: [facility];  Service: Orthopedics;  Laterality: Left;  LEFT FOOT LISFRANCE REPAIR   TUBAL LIGATION N/A 04/15/2017   Procedure: POST PARTUM TUBAL LIGATION;  Surgeon: Barbra Lang PARAS, DO;  [facility];  Service: Gynecology;  Laterality: N/A;   Patient Active Problem List   Diagnosis Date Noted   Dislocation of tarsometatarsal joint of left foot 09/10/2023   Unwanted fertility 03/25/2017   Herpes simplex type 2 infection 08/07/2011   Morbid obesity with BMI of 45.0-49.9, adult (HCC) 03/25/2011   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   THERAPY DIAG:  Difficulty in walking, not elsewhere classified  Pain in left foot  Rationale for Evaluation and Treatment: Rehabilitation  ONSET DATE: injury aug 2, surgical repair  09/23/23  SUBJECTIVE:   SUBJECTIVE STATEMENT:  Still feel how it felt before surgery, can walk without boot but it hurts.   I fell, injured foot, went to urgent care, then eventually got into Dr. Lacie and he did the surgery.  I can't use the crutches too well, just going to and from bathroom. Staying in my bed most of the time with foot elevated  PERTINENT HISTORY: Fell at home, injured L foot  PAIN:  Are you having pain? Yes: NPRS scale: 3/10 Pain location: R dorsum foot  Aggravating factors: movement Relieving factors: resting   PRECAUTIONS: Other: NWB L, use Cam boot with all upright activities and when sleeping  RED FLAGS: None   WEIGHT BEARING RESTRICTIONS: Yes NWB L   FALLS:  Has patient fallen in last 6 months? Yes. Number of falls 1  LIVING ENVIRONMENT: Lives with: lives with their family Lives in: House/apartment Stairs: Yes: External: 3 steps; on left going up Has following equipment at home: Crutches  OCCUPATION: stay at home Mom  PLOF: Independent  PATIENT GOALS: Be able to walk normally and take care of my kids normally   NEXT MD VISIT: returns to surgeon Sept 17  OBJECTIVE:  Note: Objective measures were completed at Evaluation unless otherwise noted.  DIAGNOSTIC FINDINGS: see surgical notes  PATIENT SURVEYS:  Lower Extremity Functional Score: 10 / 80 = 12.5 %  COGNITION: Overall cognitive status: Within functional limits for tasks assessed  SENSATION: WFL  EDEMA:    MUSCLE LENGTH: Hamstrings: Right wfl deg; Left wfl deg Debby test: Right wfl deg; Left wfl deg  POSTURE: rounded shoulders, forward head, and L foot with 2 incisions, one medial dorsal mid foot and one mid foot dorsal surface with sutures intact.  No drainage or discoloration.  One pinhole open area just medial to the lateral scar  PALPATION: Na L foot due to recent surgery  LOWER EXTREMITY ROM:  Active ROM Right eval Left eval  Hip flexion Wfl, all motions wfl   Hip extension  wfl  Hip abduction  wfl  Hip adduction  wfl  Hip internal rotation  wfl  Hip external rotation  wfl  Knee flexion  wfl  Knee extension  wfl  Ankle dorsiflexion  deferred  Ankle plantarflexion    Ankle inversion    Ankle eversion     (Blank rows = not tested)  LOWER EXTREMITY MMT:all wfl except L knee, ankle musculature NT    FUNCTIONAL TESTS:  Timed up and go (TUG): greater than 30 sec and un able to complete while maintaining NWB L   GAIT: Distance walked: 10' Assistive device utilized: Crutches Level of assistance: SBA Comments: unable to really maintain NWB L, more touch down pattern.  Curtches poor fit, adjusted with better balance , posture noted                                                                                                                                TREATMENT DATE:  10/15/23 NuStep L 5 x 6 min Went over HEP L ankle PROM in all directions  L metatarsal Jt mobs L anle 4 way red x 10 each Sit to stand x 5 with UE then x5 without UE   09/29/23  Eval.  Removed ace bandage and guaze wrap, also xeroform bandage Applied 2 tegraderm bandages to dorsal foot over incisions.  Also added guaze wrap around L foot and ankle for protection / padding Positioned pt in prone with cam boot on, instructed to stretch prone on elbows, 1-2 x a day to get her trunk into an alternative position Advised her to wear boot while sleeping   PATIENT EDUCATION:  Education details: POC, goals Person educated: Patient Education method: Explanation, Demonstration, Tactile cues, and Verbal cues Education comprehension: verbalized understanding, returned demonstration, verbal cues required, tactile cues required, and needs further education  HOME EXERCISE PROGRAM: Access Code: [email or web address] URL: [email or web address] Date: 10/15/2023 Prepared by: Tanda Sorrow  Exercises - Seated Ankle Alphabet  - 1 x daily - 7 x weekly - 3 sets - 10 reps -  Seated Ankle Plantarflexion AROM  - 1 x daily - 7 x weekly - 3 sets - 10 reps - Seated Ankle Eversion AROM  - 1 x daily - 7 x weekly - 3 sets - 10 reps - Seated Ankle Inversion AROM  - 1 x daily - 7 x weekly - 3  sets - 10 reps - Seated Ankle Dorsiflexion AROM  - 1 x daily - 7 x weekly - 3 sets - 10 reps  ASSESSMENT:  CLINICAL IMPRESSION: Patient is a 33 y.o. female who was seen today for physical therapy following surgical repair of LisFranc dislocation L foot, after a traumatic fall. She enters today ambulating with boot on LLE and no AD. She said MD wanted her to wear the boot two more weeks. Introduced pt to some passive and active ankle interventions. Pain reported throughout session, most occurred with DF.   Gave pt HEP and reviewed all interventions. She should benefit from physical therapy to assist her with regaining her mobility, strength, balance and gait once she is cleared for more activity through the orthopedic surgeon.    OBJECTIVE IMPAIRMENTS: decreased activity tolerance, decreased balance, decreased endurance, decreased knowledge of condition, decreased knowledge of use of DME, decreased mobility, difficulty walking, decreased ROM, decreased strength, decreased safety awareness, impaired flexibility, postural dysfunction, and pain.   ACTIVITY LIMITATIONS: carrying, lifting, standing, squatting, sleeping, stairs, transfers, bed mobility, bathing, toileting, dressing, hygiene/grooming, and locomotion level  PARTICIPATION LIMITATIONS: medication management  PERSONAL FACTORS: Behavior pattern, Fitness, Time since onset of injury/illness/exacerbation, and 1-2 comorbidities: obsity are also affecting patient's functional outcome.   REHAB POTENTIAL: Good  CLINICAL DECISION MAKING: Evolving/moderate complexity  EVALUATION COMPLEXITY: Moderate   GOALS: Goals reviewed with patient? Yes  SHORT TERM GOALS: Target date: 4 weeks, 10/27/23 I HEP Baseline: Goal status: INITIAL  LONG  TERM GOALS: Target date: 12 weeks, 12/22/23  TUG score less than 14 sec with LAD  Baseline: failed at eval Goal status: INITIAL  2.  LEFS improve from 12.5% to 50% or greater Baseline:  Goal status: INITIAL  3.  L ankle ROM for plantarflexion 45 degrees, dorsiflexion 10 degrees or greater Baseline: TBD Goal status: INITIAL  4.  L ankle strength 4/5  Baseline: TBD Goal status: INITIAL  5.  FGA 27/30 Baseline: TBD Goal status: INITIAL     PLAN:  PT FREQUENCY: 1x/week  PT DURATION: 12 weeks  PLANNED INTERVENTIONS: 97110-Therapeutic exercises, 97530- Therapeutic activity, 97112- Neuromuscular re-education, 97535- Self Care, and 02859- Manual therapy  PLAN FOR NEXT SESSION: initiate ROM, strengthening pending updated direction from orthopedic surgeon, gait training as appropriate   Tanda KANDICE Sorrow, PTA 10/15/2023, 1:08 PM

## 2023-10-22 ENCOUNTER — Ambulatory Visit

## 2023-10-24 ENCOUNTER — Ambulatory Visit

## 2023-10-29 ENCOUNTER — Ambulatory Visit: Attending: Orthopaedic Surgery

## 2023-11-04 ENCOUNTER — Ambulatory Visit

## 2023-11-07 ENCOUNTER — Encounter (HOSPITAL_BASED_OUTPATIENT_CLINIC_OR_DEPARTMENT_OTHER): Payer: Self-pay

## 2023-11-07 ENCOUNTER — Encounter (HOSPITAL_BASED_OUTPATIENT_CLINIC_OR_DEPARTMENT_OTHER): Admitting: Orthopaedic Surgery

## 2023-11-24 ENCOUNTER — Encounter: Payer: Self-pay | Admitting: Radiology
